# Patient Record
Sex: Female | Born: 1955 | Race: White | Hispanic: No | Marital: Married | Smoking: Never smoker
Health system: Southern US, Community
[De-identification: ages and names within clinical notes are randomized; demographics above are authoritative.]

## PROBLEM LIST (undated history)

## (undated) DIAGNOSIS — I10 Essential (primary) hypertension: Secondary | ICD-10-CM

## (undated) DIAGNOSIS — E785 Hyperlipidemia, unspecified: Secondary | ICD-10-CM

## (undated) DIAGNOSIS — M1990 Unspecified osteoarthritis, unspecified site: Secondary | ICD-10-CM

## (undated) DIAGNOSIS — F419 Anxiety disorder, unspecified: Secondary | ICD-10-CM

## (undated) DIAGNOSIS — G629 Polyneuropathy, unspecified: Secondary | ICD-10-CM

## (undated) DIAGNOSIS — G4730 Sleep apnea, unspecified: Secondary | ICD-10-CM

## (undated) DIAGNOSIS — R0600 Dyspnea, unspecified: Secondary | ICD-10-CM

## (undated) DIAGNOSIS — E119 Type 2 diabetes mellitus without complications: Secondary | ICD-10-CM

## (undated) DIAGNOSIS — F329 Major depressive disorder, single episode, unspecified: Secondary | ICD-10-CM

## (undated) DIAGNOSIS — F32A Depression, unspecified: Secondary | ICD-10-CM

## (undated) HISTORY — PX: ABDOMINAL HYSTERECTOMY: SHX81

## (undated) HISTORY — PX: KNEE ARTHROCENTESIS: SUR44

## (undated) HISTORY — DX: Hyperlipidemia, unspecified: E78.5

## (undated) HISTORY — PX: APPENDECTOMY: SHX54

---

## 1979-12-17 HISTORY — PX: KNEE ARTHROCENTESIS: SUR44

## 1990-12-16 HISTORY — PX: KNEE ARTHROCENTESIS: SUR44

## 1993-12-16 HISTORY — PX: OTHER SURGICAL HISTORY: SHX169

## 1993-12-16 HISTORY — PX: ABDOMINAL HYSTERECTOMY: SHX81

## 2005-05-09 ENCOUNTER — Ambulatory Visit: Payer: Self-pay | Admitting: Family Medicine

## 2009-08-15 ENCOUNTER — Ambulatory Visit: Payer: Self-pay | Admitting: Family Medicine

## 2010-06-01 ENCOUNTER — Ambulatory Visit: Payer: Self-pay | Admitting: Sports Medicine

## 2012-02-17 ENCOUNTER — Ambulatory Visit: Payer: Self-pay

## 2012-08-10 DIAGNOSIS — M1990 Unspecified osteoarthritis, unspecified site: Secondary | ICD-10-CM | POA: Insufficient documentation

## 2012-08-10 DIAGNOSIS — K219 Gastro-esophageal reflux disease without esophagitis: Secondary | ICD-10-CM | POA: Insufficient documentation

## 2015-02-15 DIAGNOSIS — G629 Polyneuropathy, unspecified: Secondary | ICD-10-CM | POA: Insufficient documentation

## 2016-04-08 DIAGNOSIS — G894 Chronic pain syndrome: Secondary | ICD-10-CM | POA: Insufficient documentation

## 2016-04-08 DIAGNOSIS — G8929 Other chronic pain: Secondary | ICD-10-CM | POA: Insufficient documentation

## 2016-04-15 ENCOUNTER — Other Ambulatory Visit: Payer: Self-pay | Admitting: Student

## 2016-04-15 DIAGNOSIS — R140 Abdominal distension (gaseous): Secondary | ICD-10-CM

## 2016-04-15 DIAGNOSIS — R1084 Generalized abdominal pain: Secondary | ICD-10-CM

## 2016-04-18 ENCOUNTER — Ambulatory Visit
Admission: RE | Admit: 2016-04-18 | Discharge: 2016-04-18 | Disposition: A | Discharge status: Home / Self Care | Payer: BLUE CROSS/BLUE SHIELD | Source: Ambulatory Visit | Attending: Student | Admitting: Student

## 2016-04-18 DIAGNOSIS — R1084 Generalized abdominal pain: Secondary | ICD-10-CM

## 2016-04-18 DIAGNOSIS — K760 Fatty (change of) liver, not elsewhere classified: Secondary | ICD-10-CM | POA: Diagnosis not present

## 2016-04-18 DIAGNOSIS — R140 Abdominal distension (gaseous): Secondary | ICD-10-CM

## 2016-05-03 ENCOUNTER — Encounter: Payer: Self-pay | Admitting: *Deleted

## 2016-05-03 ENCOUNTER — Inpatient Hospital Stay: Payer: BLUE CROSS/BLUE SHIELD | Admitting: Internal Medicine

## 2016-05-24 ENCOUNTER — Encounter: Payer: Self-pay | Admitting: *Deleted

## 2016-05-27 ENCOUNTER — Ambulatory Visit: Payer: BLUE CROSS/BLUE SHIELD | Admitting: Anesthesiology

## 2016-05-27 ENCOUNTER — Encounter
Admission: RE | Disposition: A | Discharge status: Home / Self Care | Payer: Self-pay | Source: Ambulatory Visit | Attending: Unknown Physician Specialty

## 2016-05-27 ENCOUNTER — Ambulatory Visit
Admission: RE | Admit: 2016-05-27 | Discharge: 2016-05-27 | Disposition: A | Discharge status: Home / Self Care | Payer: BLUE CROSS/BLUE SHIELD | Source: Ambulatory Visit | Attending: Unknown Physician Specialty | Admitting: Unknown Physician Specialty

## 2016-05-27 ENCOUNTER — Encounter: Payer: Self-pay | Admitting: *Deleted

## 2016-05-27 DIAGNOSIS — E785 Hyperlipidemia, unspecified: Secondary | ICD-10-CM | POA: Insufficient documentation

## 2016-05-27 DIAGNOSIS — Z794 Long term (current) use of insulin: Secondary | ICD-10-CM | POA: Insufficient documentation

## 2016-05-27 DIAGNOSIS — D123 Benign neoplasm of transverse colon: Secondary | ICD-10-CM | POA: Diagnosis not present

## 2016-05-27 DIAGNOSIS — D125 Benign neoplasm of sigmoid colon: Secondary | ICD-10-CM | POA: Insufficient documentation

## 2016-05-27 DIAGNOSIS — F329 Major depressive disorder, single episode, unspecified: Secondary | ICD-10-CM | POA: Diagnosis not present

## 2016-05-27 DIAGNOSIS — D124 Benign neoplasm of descending colon: Secondary | ICD-10-CM | POA: Insufficient documentation

## 2016-05-27 DIAGNOSIS — I10 Essential (primary) hypertension: Secondary | ICD-10-CM | POA: Diagnosis not present

## 2016-05-27 DIAGNOSIS — M19049 Primary osteoarthritis, unspecified hand: Secondary | ICD-10-CM | POA: Insufficient documentation

## 2016-05-27 DIAGNOSIS — K529 Noninfective gastroenteritis and colitis, unspecified: Secondary | ICD-10-CM | POA: Insufficient documentation

## 2016-05-27 DIAGNOSIS — F419 Anxiety disorder, unspecified: Secondary | ICD-10-CM | POA: Insufficient documentation

## 2016-05-27 DIAGNOSIS — Z8371 Family history of colonic polyps: Secondary | ICD-10-CM | POA: Diagnosis not present

## 2016-05-27 DIAGNOSIS — Z7982 Long term (current) use of aspirin: Secondary | ICD-10-CM | POA: Insufficient documentation

## 2016-05-27 DIAGNOSIS — E119 Type 2 diabetes mellitus without complications: Secondary | ICD-10-CM | POA: Insufficient documentation

## 2016-05-27 DIAGNOSIS — Z90710 Acquired absence of both cervix and uterus: Secondary | ICD-10-CM | POA: Diagnosis not present

## 2016-05-27 DIAGNOSIS — D120 Benign neoplasm of cecum: Secondary | ICD-10-CM | POA: Insufficient documentation

## 2016-05-27 DIAGNOSIS — D122 Benign neoplasm of ascending colon: Secondary | ICD-10-CM | POA: Insufficient documentation

## 2016-05-27 DIAGNOSIS — Z79899 Other long term (current) drug therapy: Secondary | ICD-10-CM | POA: Diagnosis not present

## 2016-05-27 DIAGNOSIS — K640 First degree hemorrhoids: Secondary | ICD-10-CM | POA: Diagnosis not present

## 2016-05-27 DIAGNOSIS — K5730 Diverticulosis of large intestine without perforation or abscess without bleeding: Secondary | ICD-10-CM | POA: Diagnosis not present

## 2016-05-27 HISTORY — PX: COLONOSCOPY WITH PROPOFOL: SHX5780

## 2016-05-27 HISTORY — DX: Anxiety disorder, unspecified: F41.9

## 2016-05-27 HISTORY — DX: Depression, unspecified: F32.A

## 2016-05-27 HISTORY — DX: Essential (primary) hypertension: I10

## 2016-05-27 HISTORY — DX: Type 2 diabetes mellitus without complications: E11.9

## 2016-05-27 HISTORY — DX: Major depressive disorder, single episode, unspecified: F32.9

## 2016-05-27 HISTORY — DX: Unspecified osteoarthritis, unspecified site: M19.90

## 2016-05-27 LAB — GLUCOSE, CAPILLARY: GLUCOSE-CAPILLARY: 209 mg/dL — AB (ref 65–99)

## 2016-05-27 SURGERY — COLONOSCOPY WITH PROPOFOL
Anesthesia: General

## 2016-05-27 MED ORDER — PROPOFOL 10 MG/ML IV BOLUS
INTRAVENOUS | Status: DC | PRN
  Administered 2016-05-27: 30 mg via INTRAVENOUS
  Administered 2016-05-27: 70 mg via INTRAVENOUS

## 2016-05-27 MED ORDER — LIDOCAINE HCL (CARDIAC) 20 MG/ML IV SOLN
INTRAVENOUS | Status: DC | PRN
  Administered 2016-05-27: 40 mg via INTRAVENOUS

## 2016-05-27 MED ORDER — SODIUM CHLORIDE 0.9 % IV SOLN
INTRAVENOUS | Status: DC
  Administered 2016-05-27: 1000 mL via INTRAVENOUS

## 2016-05-27 MED ORDER — SODIUM CHLORIDE 0.9 % IV SOLN
INTRAVENOUS | Status: DC
Start: 2016-05-27 — End: 2016-05-27

## 2016-05-27 MED ORDER — PROPOFOL 500 MG/50ML IV EMUL
INTRAVENOUS | Status: DC | PRN
  Administered 2016-05-27: 150 ug/kg/min via INTRAVENOUS

## 2016-05-27 NOTE — Anesthesia Preprocedure Evaluation (Signed)
Anesthesia Evaluation  Patient identified by MRN, date of birth, ID band Patient awake    Reviewed: Allergy & Precautions, H&P , NPO status , Patient's Chart, lab work & pertinent test results, reviewed documented beta blocker date and time   History of Anesthesia Complications Negative for: history of anesthetic complications  Airway Mallampati: IV  TM Distance: >3 FB Neck ROM: full    Dental no notable dental hx. (+) Chipped, Poor Dentition, Missing   Pulmonary neg pulmonary ROS,    Pulmonary exam normal breath sounds clear to auscultation       Cardiovascular Exercise Tolerance: Good hypertension, On Medications (-) angina(-) CAD, (-) Past MI, (-) Cardiac Stents and (-) CABG Normal cardiovascular exam(-) dysrhythmias (-) Valvular Problems/Murmurs Rhythm:regular Rate:Normal     Neuro/Psych PSYCHIATRIC DISORDERS (Anxiety and depression) negative neurological ROS     GI/Hepatic Neg liver ROS, GERD  ,  Endo/Other  diabetes, Insulin Dependent  Renal/GU negative Renal ROS  negative genitourinary   Musculoskeletal   Abdominal   Peds  Hematology negative hematology ROS (+)   Anesthesia Other Findings Past Medical History:   Depression                                                   Diabetes mellitus without complication (HCC)                 Hyperlipemia                                                 Hypertension                                                 Anxiety                                                      Arthritis                                                      Comment:hand   Reproductive/Obstetrics negative OB ROS                             Anesthesia Physical Anesthesia Plan  ASA: III  Anesthesia Plan: General   Post-op Pain Management:    Induction:   Airway Management Planned:   Additional Equipment:   Intra-op Plan:   Post-operative Plan:    Informed Consent: I have reviewed the patients History and Physical, chart, labs and discussed the procedure including the risks, benefits and alternatives for the proposed anesthesia with the patient or authorized representative who has indicated his/her understanding and acceptance.   Dental Advisory Given  Plan Discussed with: Anesthesiologist, CRNA and Surgeon  Anesthesia Plan  Comments:         Anesthesia Quick Evaluation

## 2016-05-27 NOTE — Transfer of Care (Signed)
Immediate Anesthesia Transfer of Care Note  Patient: Emma White  Procedure(s) Performed: Procedure(s): COLONOSCOPY WITH PROPOFOL (N/A)  Patient Location: Endoscopy Unit  Anesthesia Type:General  Level of Consciousness: awake and alert   Airway & Oxygen Therapy: Patient Spontanous Breathing and Patient connected to nasal cannula oxygen  Post-op Assessment: Report given to RN and Post -op Vital signs reviewed and stable  Post vital signs: Reviewed and stable  Last Vitals:  Filed Vitals:   05/27/16 0707  BP: 142/59  Pulse: 93  Temp: 36.9 C  Resp: 18    Last Pain:  Filed Vitals:   05/27/16 0708  PainSc: 9          Complications: No apparent anesthesia complications

## 2016-05-27 NOTE — H&P (Signed)
Primary Care Physician:  Lavonne Chick, MD Primary Gastroenterologist:  Dr. Vira Agar  Pre-Procedure History & Physical: HPI:  Emma White is a 60 y.o. female is here for an colonoscopy.   Past Medical History  Diagnosis Date  . Depression   . Diabetes mellitus without complication ([place])   . Hyperlipemia   . Hypertension   . Anxiety   . Arthritis     hand    Past Surgical History  Procedure Laterality Date  . Knee arthrocentesis Left 1981  . Knee arthrocentesis Right 1992  . Hysterectomy supracervical abdominal w/removal tubes &/or ovaries  1995  . Abdominal hysterectomy    . Knee arthrocentesis Bilateral     Prior to Admission medications   Medication Sig Start Date End Date Taking? Authorizing Provider  aspirin 81 MG tablet Take 81 mg by mouth daily.   Yes Historical Provider, MD  citalopram (CELEXA) 20 MG tablet Take 20 mg by mouth daily.   Yes Historical Provider, MD  gabapentin (NEURONTIN) 800 MG tablet Take 800 mg by mouth 3 (three) times daily.   Yes Historical Provider, MD  HYDROcodone-acetaminophen (NORCO/VICODIN) 5-325 MG tablet Take 1 tablet by mouth every 6 (six) hours as needed for moderate pain.   Yes Historical Provider, MD  insulin aspart (NOVOLOG) 100 UNIT/ML injection Inject into the skin 3 (three) times daily before meals.   Yes Historical Provider, MD  insulin detemir (LEVEMIR) 100 UNIT/ML injection Inject into the skin at bedtime.   Yes Historical Provider, MD  lisinopril (PRINIVIL,ZESTRIL) 2.5 MG tablet Take 2.5 mg by mouth daily.   Yes Historical Provider, MD  metFORMIN (GLUCOPHAGE) 500 MG tablet Take 1,000 mg by mouth 2 (two) times daily with a meal.   Yes Historical Provider, MD  MULTIPLE VITAMIN-FOLIC ACID PO Take 1 tablet by mouth once.   Yes Historical Provider, MD  Potassium 99 MG TABS Take 1 tablet by mouth daily.   Yes Historical Provider, MD  pravastatin (PRAVACHOL) 10 MG tablet Take 10 mg by mouth daily.   Yes Historical Provider, MD   pregabalin (LYRICA) 100 MG capsule Take 100 mg by mouth 2 (two) times daily.   Yes Historical Provider, MD    Allergies as of 05/10/2016  . (Not on File)    History reviewed. No pertinent family history.  Social History   Social History  . Marital Status: Married    Spouse Name: N/A  . Number of Children: N/A  . Years of Education: N/A   Occupational History  . Not on file.   Social History Main Topics  . Smoking status: Never Smoker   . Smokeless tobacco: Not on file  . Alcohol Use: No  . Drug Use: No  . Sexual Activity: Not on file   Other Topics Concern  . Not on file   Social History Narrative   ** Merged History Encounter **        Review of Systems: See HPI, otherwise negative ROS  Physical Exam: BP 142/59 mmHg  Pulse 93  Temp(Src) 98.4 F (36.9 C) (Tympanic)  Resp 18  Ht 5' (1.524 m)  Wt 69.4 kg (153 lb)  BMI 29.88 kg/m2  SpO2 98% General:   Alert,  pleasant and cooperative in NAD Head:  Normocephalic and atraumatic. Neck:  Supple; no masses or thyromegaly. Lungs:  Clear throughout to auscultation.    Heart:  Regular rate and rhythm. Abdomen:  Soft, nontender and nondistended. Normal bowel sounds, without guarding, and without rebound.  Neurologic:  Alert and  oriented x4;  grossly normal neurologically.  Impression/Plan: Emma White is here for an colonoscopy to be performed for FH colon polyps, chronic diarrhea.  Risks, benefits, limitations, and alternatives regarding  colonoscopy have been reviewed with the patient.  Questions have been answered.  All parties agreeable.   Gaylyn Cheers, MD  05/27/2016, 7:34 AM

## 2016-05-27 NOTE — Op Note (Signed)
[facility] Gastroenterology Patient Name: Emma White Procedure Date: 05/27/2016 7:35 AM MRN: [id number] Account #: [id number] Date of Birth: May 24, 1956 Admit Type: Outpatient Age: 60 Room: [facility] ENDO ROOM 1 Gender: Female Note Status: Finalized Procedure:            Colonoscopy Indications:          Screening for colorectal malignant neoplasm Providers:            Manya Silvas, MD Referring MD:         No Local Md, MD (Referring MD) Medicines:            Propofol per Anesthesia Complications:        No immediate complications. Procedure:            Pre-Anesthesia Assessment:                       - After reviewing the risks and benefits, the patient                        was deemed in satisfactory condition to undergo the                        procedure.                       After obtaining informed consent, the colonoscope was                        passed under direct vision. Throughout the procedure,                        the patient's blood pressure, pulse, and oxygen                        saturations were monitored continuously. The                        Colonoscope was introduced through the anus and                        advanced to the the cecum, identified by appendiceal                        orifice and ileocecal valve. The colonoscopy was                        performed without difficulty. The patient tolerated the                        procedure well. The quality of the bowel preparation                        was adequate to identify polyps. Findings:      Two sessile polyps were found in the sigmoid colon and descending colon.       The polyps were diminutive in size. These polyps were removed with a       jumbo cold forceps. Resection and retrieval were complete.      A small polyp was found in the transverse colon. The polyp was sessile.       The polyp was removed with  a hot snare. Resection and retrieval were       complete.     A diminutive polyp was found in the transverse colon. The polyp was       sessile. The polyp was removed with a jumbo cold forceps. Resection and       retrieval were complete.      A medium polyp was found in the ascending colon. The polyp was sessile.       The polyp was removed with a hot snare. Resection and retrieval were       complete. To prevent bleeding after the polypectomy, one hemostatic clip       was successfully placed. There was no bleeding during, or at the end, of       the procedure.      A small polyp was found in the ascending colon. The polyp was sessile.       The polyp was removed with a hot snare. Resection and retrieval were       complete. To prevent bleeding after the polypectomy, one hemostatic clip       was successfully placed. There was no bleeding during, or at the end, of       the procedure.      A medium polyp was found in the ascending colon. The polyp was sessile.       The polyp was removed with a hot snare. Resection and retrieval were       complete. To prevent bleeding after the polypectomy, one hemostatic clip       was successfully placed. There was no bleeding during, or at the end, of       the procedure.      A small polyp was found in the ascending colon. The polyp was sessile.       The polyp was removed with a hot snare. Resection and retrieval were       complete. To prevent bleeding after the polypectomy, one hemostatic clip       was successfully placed. There was no bleeding during, or at the end, of       the procedure.      A medium polyp was found in the cecum. The polyp was sessile. The polyp       was removed with a hot snare. Resection and retrieval were complete. To       prevent bleeding after the polypectomy, one hemostatic clip was       successfully placed.      A diminutive polyp was found in the cecum. The polyp was sessile. The       polyp was removed with a jumbo cold forceps. Resection and retrieval       were  complete.      Two sessile polyps were found in the cecum. The polyps were diminutive       in size. These polyps were removed with a jumbo cold forceps. Resection       and retrieval were complete.      A small polyp was found in the distal ascending colon. The polyp was       sessile. The polyp was removed with a hot snare. Resection and retrieval       were complete.      A few medium-mouthed diverticula were found in the sigmoid colon.      Internal hemorrhoids were found during endoscopy. The hemorrhoids were  small and Grade I (internal hemorrhoids that do not prolapse). Impression:           - Two diminutive polyps in the sigmoid colon and in the                        descending colon, removed with a jumbo cold forceps.                        Resected and retrieved.                       - One small polyp in the transverse colon, removed with                        a hot snare. Resected and retrieved.                       - One diminutive polyp in the transverse colon, removed                        with a jumbo cold forceps. Resected and retrieved.                       - One medium polyp in the ascending colon, removed with                        a hot snare. Resected and retrieved. Clip was placed.                       - One small polyp in the ascending colon, removed with                        a hot snare. Resected and retrieved. Clip was placed.                       - One medium polyp in the ascending colon, removed with                        a hot snare. Resected and retrieved. Clip was placed.                       - One small polyp in the ascending colon, removed with                        a hot snare. Resected and retrieved. Clip was placed.                       - One medium polyp in the cecum, removed with a hot                        snare. Resected and retrieved. Clip was placed.                       - One diminutive polyp in the cecum, removed with a                         jumbo cold forceps. Resected and retrieved.                       -  Two diminutive polyps in the cecum, removed with a                        jumbo cold forceps. Resected and retrieved.                       - One small polyp in the distal ascending colon,                        removed with a hot snare. Resected and retrieved.                       - Diverticulosis in the sigmoid colon.                       - Internal hemorrhoids. Recommendation:       - Await pathology results. Manya Silvas, MD 05/27/2016 8:49:56 AM This report has been signed electronically. Number of Addenda: 0 Note Initiated On: 05/27/2016 7:35 AM Scope Withdrawal Time: 0 hours 43 minutes 42 seconds  Total Procedure Duration: 0 hours 59 minutes 18 seconds       [facility]

## 2016-05-27 NOTE — Anesthesia Postprocedure Evaluation (Signed)
Anesthesia Post Note  Patient: Emma White  Procedure(s) Performed: Procedure(s) (LRB): COLONOSCOPY WITH PROPOFOL (N/A)  Patient location during evaluation: Endoscopy Anesthesia Type: General Level of consciousness: awake and alert Pain management: pain level controlled Vital Signs Assessment: post-procedure vital signs reviewed and stable Respiratory status: spontaneous breathing, nonlabored ventilation, respiratory function stable and patient connected to nasal cannula oxygen Cardiovascular status: blood pressure returned to baseline and stable Postop Assessment: no signs of nausea or vomiting Anesthetic complications: no    Last Vitals:  Filed Vitals:   05/27/16 0902 05/27/16 0912  BP: 135/76 131/67  Pulse: 75 74  Temp:    Resp: 22 18    Last Pain:  Filed Vitals:   05/27/16 0915  PainSc: 9                  Martha Clan

## 2016-05-29 ENCOUNTER — Encounter: Payer: Self-pay | Admitting: Unknown Physician Specialty

## 2016-05-29 LAB — SURGICAL PATHOLOGY

## 2017-01-06 DIAGNOSIS — E119 Type 2 diabetes mellitus without complications: Secondary | ICD-10-CM

## 2017-01-06 DIAGNOSIS — F330 Major depressive disorder, recurrent, mild: Secondary | ICD-10-CM | POA: Insufficient documentation

## 2017-09-16 ENCOUNTER — Encounter: Payer: Self-pay | Admitting: *Deleted

## 2017-09-25 ENCOUNTER — Ambulatory Visit: Payer: BLUE CROSS/BLUE SHIELD | Admitting: Anesthesiology

## 2017-09-25 ENCOUNTER — Encounter
Admission: RE | Disposition: A | Discharge status: Home / Self Care | Payer: Self-pay | Source: Ambulatory Visit | Attending: Ophthalmology

## 2017-09-25 ENCOUNTER — Encounter: Payer: Self-pay | Admitting: *Deleted

## 2017-09-25 ENCOUNTER — Ambulatory Visit
Admission: RE | Admit: 2017-09-25 | Discharge: 2017-09-25 | Disposition: A | Discharge status: Home / Self Care | Payer: BLUE CROSS/BLUE SHIELD | Source: Ambulatory Visit | Attending: Ophthalmology | Admitting: Ophthalmology

## 2017-09-25 DIAGNOSIS — R0602 Shortness of breath: Secondary | ICD-10-CM | POA: Insufficient documentation

## 2017-09-25 DIAGNOSIS — I10 Essential (primary) hypertension: Secondary | ICD-10-CM | POA: Insufficient documentation

## 2017-09-25 DIAGNOSIS — Z90710 Acquired absence of both cervix and uterus: Secondary | ICD-10-CM | POA: Insufficient documentation

## 2017-09-25 DIAGNOSIS — E1121 Type 2 diabetes mellitus with diabetic nephropathy: Secondary | ICD-10-CM | POA: Insufficient documentation

## 2017-09-25 DIAGNOSIS — E1136 Type 2 diabetes mellitus with diabetic cataract: Secondary | ICD-10-CM | POA: Diagnosis not present

## 2017-09-25 DIAGNOSIS — M1990 Unspecified osteoarthritis, unspecified site: Secondary | ICD-10-CM | POA: Diagnosis not present

## 2017-09-25 DIAGNOSIS — H2512 Age-related nuclear cataract, left eye: Secondary | ICD-10-CM | POA: Diagnosis present

## 2017-09-25 DIAGNOSIS — E7800 Pure hypercholesterolemia, unspecified: Secondary | ICD-10-CM | POA: Diagnosis not present

## 2017-09-25 HISTORY — DX: Dyspnea, unspecified: R06.00

## 2017-09-25 HISTORY — PX: EYE SURGERY: SHX253

## 2017-09-25 HISTORY — PX: CATARACT EXTRACTION W/PHACO: SHX586

## 2017-09-25 HISTORY — DX: Polyneuropathy, unspecified: G62.9

## 2017-09-25 LAB — GLUCOSE, CAPILLARY: GLUCOSE-CAPILLARY: 178 mg/dL — AB (ref 65–99)

## 2017-09-25 SURGERY — PHACOEMULSIFICATION, CATARACT, WITH IOL INSERTION
Anesthesia: Monitor Anesthesia Care | Laterality: Left

## 2017-09-25 MED ORDER — SODIUM CHLORIDE 0.9 % IV SOLN
INTRAVENOUS | Status: DC
  Administered 2017-09-25: 10:00:00 via INTRAVENOUS

## 2017-09-25 MED ORDER — MOXIFLOXACIN HCL 0.5 % OP SOLN
OPHTHALMIC | Status: DC | PRN
  Administered 2017-09-25: 2 [drp] via OPHTHALMIC

## 2017-09-25 MED ORDER — POVIDONE-IODINE 5 % OP SOLN
OPHTHALMIC | Status: AC
  Filled 2017-09-25: qty 30

## 2017-09-25 MED ORDER — MOXIFLOXACIN HCL 0.5 % OP SOLN: 1.0000 [drp] | OPHTHALMIC | Status: DC | PRN

## 2017-09-25 MED ORDER — FENTANYL CITRATE (PF) 100 MCG/2ML IJ SOLN
INTRAMUSCULAR | Status: AC
  Filled 2017-09-25: qty 2

## 2017-09-25 MED ORDER — LIDOCAINE HCL (PF) 4 % IJ SOLN
INTRAOCULAR | Status: DC | PRN
  Administered 2017-09-25: .5 mL via OPHTHALMIC

## 2017-09-25 MED ORDER — LIDOCAINE HCL (PF) 4 % IJ SOLN
INTRAMUSCULAR | Status: AC
  Filled 2017-09-25: qty 5

## 2017-09-25 MED ORDER — EPINEPHRINE PF 1 MG/ML IJ SOLN
INTRAMUSCULAR | Status: AC
  Filled 2017-09-25: qty 1

## 2017-09-25 MED ORDER — MIDAZOLAM HCL 2 MG/2ML IJ SOLN
INTRAMUSCULAR | Status: DC | PRN
  Administered 2017-09-25 (×2): 1 mg via INTRAVENOUS

## 2017-09-25 MED ORDER — MIDAZOLAM HCL 2 MG/2ML IJ SOLN
INTRAMUSCULAR | Status: AC
  Filled 2017-09-25: qty 2

## 2017-09-25 MED ORDER — SODIUM HYALURONATE 23 MG/ML IO SOLN
INTRAOCULAR | Status: DC | PRN
  Administered 2017-09-25: 1 mL via INTRAOCULAR

## 2017-09-25 MED ORDER — ARMC OPHTHALMIC DILATING DROPS
1.0000 "application " | OPHTHALMIC | Status: AC
  Administered 2017-09-25 (×3): 1 via OPHTHALMIC

## 2017-09-25 MED ORDER — NA CHONDROIT SULF-NA HYALURON 40-17 MG/ML IO SOLN
INTRAOCULAR | Status: AC
  Filled 2017-09-25: qty 1

## 2017-09-25 MED ORDER — BSS IO SOLN
INTRAOCULAR | Status: DC | PRN
  Administered 2017-09-25: 1 via INTRAOCULAR

## 2017-09-25 MED ORDER — FENTANYL CITRATE (PF) 100 MCG/2ML IJ SOLN
INTRAMUSCULAR | Status: DC | PRN
  Administered 2017-09-25 (×2): 50 ug via INTRAVENOUS

## 2017-09-25 SURGICAL SUPPLY — 18 items
DISSECTOR HYDRO NUCLEUS 50X22 (MISCELLANEOUS) ×2 IMPLANT
GLOVE BIO SURGEON STRL SZ8 (GLOVE) ×2 IMPLANT
GLOVE BIOGEL M 6.5 STRL (GLOVE) ×2 IMPLANT
GLOVE SURG LX 7.5 STRW (GLOVE) ×1
GLOVE SURG LX STRL 7.5 STRW (GLOVE) ×1 IMPLANT
GOWN STRL REUS W/ TWL LRG LVL3 (GOWN DISPOSABLE) ×2 IMPLANT
GOWN STRL REUS W/TWL LRG LVL3 (GOWN DISPOSABLE) ×2
LABEL CATARACT MEDS ST (LABEL) ×2 IMPLANT
LENS IOL TECNIS ITEC 21.5 (Intraocular Lens) ×2 IMPLANT
PACK CATARACT (MISCELLANEOUS) ×2 IMPLANT
PACK CATARACT KING (MISCELLANEOUS) ×2 IMPLANT
PACK EYE AFTER SURG (MISCELLANEOUS) ×2 IMPLANT
SOL BAL SALT 15ML (MISCELLANEOUS) ×2
SOL BSS BAG (MISCELLANEOUS) ×2
SOLUTION BAL SALT 15ML (MISCELLANEOUS) ×1 IMPLANT
SOLUTION BSS BAG (MISCELLANEOUS) ×1 IMPLANT
WATER STERILE IRR 250ML POUR (IV SOLUTION) ×2 IMPLANT
WIPE NON LINTING 3.25X3.25 (MISCELLANEOUS) ×2 IMPLANT

## 2017-09-25 NOTE — Transfer of Care (Signed)
Immediate Anesthesia Transfer of Care Note  Patient: Emma White  Procedure(s) Performed: CATARACT EXTRACTION PHACO AND INTRAOCULAR LENS PLACEMENT (IOC) (Left )  Patient Location: PACU and Short Stay  Anesthesia Type:MAC  Level of Consciousness: awake, alert  and oriented  Airway & Oxygen Therapy: Patient Spontanous Breathing  Post-op Assessment: Report given to RN and Post -op Vital signs reviewed and stable  Post vital signs: Reviewed and stable  Last Vitals:  Vitals:   09/25/17 0929 09/25/17 1122  BP: (!) 153/63 (!) 109/46  Pulse: 78 73  Resp: 20 18  Temp: (!) 36 C   SpO2: 96% 95%    Last Pain:  Vitals:   09/25/17 0929  TempSrc: Tympanic         Complications: No apparent anesthesia complications

## 2017-09-25 NOTE — Anesthesia Post-op Follow-up Note (Signed)
Anesthesia QCDR form completed.        

## 2017-09-25 NOTE — Anesthesia Preprocedure Evaluation (Signed)
Anesthesia Evaluation  Patient identified by MRN, date of birth, ID band Patient awake    Reviewed: Allergy & Precautions, NPO status , Patient's Chart, lab work & pertinent test results  History of Anesthesia Complications Negative for: history of anesthetic complications  Airway Mallampati: III       Dental  (+) Chipped, Missing   Pulmonary neg sleep apnea, neg COPD,           Cardiovascular hypertension, Pt. on medications (-) Past MI and (-) CHF (-) dysrhythmias (-) Valvular Problems/Murmurs     Neuro/Psych neg Seizures Anxiety Depression    GI/Hepatic Neg liver ROS, neg GERD  ,  Endo/Other  diabetes, Type 2, Insulin Dependent, Oral Hypoglycemic Agents  Renal/GU negative Renal ROS     Musculoskeletal   Abdominal   Peds  Hematology   Anesthesia Other Findings   Reproductive/Obstetrics                             Anesthesia Physical Anesthesia Plan  ASA: III  Anesthesia Plan: MAC   Post-op Pain Management:    Induction: Intravenous  PONV Risk Score and Plan:   Airway Management Planned: Nasal Cannula  Additional Equipment:   Intra-op Plan:   Post-operative Plan:   Informed Consent: I have reviewed the patients History and Physical, chart, labs and discussed the procedure including the risks, benefits and alternatives for the proposed anesthesia with the patient or authorized representative who has indicated his/her understanding and acceptance.     Plan Discussed with:   Anesthesia Plan Comments:         Anesthesia Quick Evaluation

## 2017-09-25 NOTE — Op Note (Signed)
OPERATIVE NOTE  Emma White [id number] 09/25/2017   PREOPERATIVE DIAGNOSIS:  Nuclear sclerotic cataract left eye.  H25.12   POSTOPERATIVE DIAGNOSIS:    Nuclear sclerotic cataract left eye.     PROCEDURE:  Phacoemusification with posterior chamber intraocular lens placement of the left eye   LENS:   Implant Name Type Inv. Item Serial No. Manufacturer Lot No. LRB No. Used  LENS IOL DIOP 21.5 - [id number] Intraocular Lens LENS IOL DIOP 21.5 [id number] AMO   Left 1       PCB00 +21.5   ULTRASOUND TIME: 0 minutes 32.7 seconds.  CDE 3.95   SURGEON:  Benay Pillow, MD, MPH   ANESTHESIA:  Topical with tetracaine drops augmented with 1% preservative-free intracameral lidocaine.  ESTIMATED BLOOD LOSS: <1 mL   COMPLICATIONS:  None.   DESCRIPTION OF PROCEDURE:  The patient was identified in the holding room and transported to the operating room and placed in the supine position under the operating microscope.  The left eye was identified as the operative eye and it was prepped and draped in the usual sterile ophthalmic fashion.   A 1.0 millimeter clear-corneal paracentesis was made at the 5:00 position. 0.5 ml of preservative-free 1% lidocaine with epinephrine was injected into the anterior chamber.  The anterior chamber was filled with Discovisc viscoelastic.  A 2.4 millimeter keratome was used to make a near-clear corneal incision at the 2:00 position.  A curvilinear capsulorrhexis was made with a cystotome and capsulorrhexis forceps.  Balanced salt solution was used to hydrodissect and hydrodelineate the nucleus.   Phacoemulsification was then used in stop and chop fashion to remove the lens nucleus and epinucleus.  The remaining cortex was then removed using the irrigation and aspiration handpiece. Discovisc was then placed into the capsular bag to distend it for lens placement.  A lens was then injected into the capsular bag.  The remaining viscoelastic was aspirated.   Wounds were  hydrated with balanced salt solution.  The anterior chamber was inflated to a physiologic pressure with balanced salt solution.  Intracameral vigamox 0.1 mL undiltued was injected into the eye and a drop placed onto the ocular surface.  No wound leaks were noted.  The patient was taken to the recovery room in stable condition without complications of anesthesia or surgery  Benay Pillow 09/25/2017, 11:20 AM

## 2017-09-25 NOTE — H&P (Signed)
The History and Physical notes are on paper, have been signed, and are to be scanned.   I have examined the patient and there are no changes to the H&P.   Emma White 09/25/2017 10:48 AM

## 2017-09-25 NOTE — Discharge Instructions (Signed)
Eye Surgery Discharge Instructions  Expect mild scratchy sensation or mild soreness. DO NOT RUB YOUR EYE!  The day of surgery:  Minimal physical activity, but bed rest is not required  No reading, computer work, or close hand work  No bending, lifting, or straining.  May watch TV  For 24 hours:  No driving, legal decisions, or alcoholic beverages  Safety precautions  Eat anything you prefer: It is better to start with liquids, then soup then solid foods.  _____ Eye patch should be worn until postoperative exam tomorrow.  ____ Solar shield eyeglasses should be worn for comfort in the sunlight/patch while sleeping  Resume all regular medications including aspirin or Coumadin if these were discontinued prior to surgery. You may shower, bathe, shave, or wash your hair. Tylenol may be taken for mild discomfort.  Call your doctor if you experience significant pain, nausea, or vomiting, fever > 101 or other signs of infection. [phone] or [phone] Specific instructions:  Follow-up Information    Eulogio Bear, MD Follow up.   Specialty:  Ophthalmology Why:  09/26/17 at 8:45 Contact information: [address] [place] [address] [phone]          Eye Surgery Discharge Instructions  Expect mild scratchy sensation or mild soreness. DO NOT RUB YOUR EYE!  The day of surgery:  Minimal physical activity, but bed rest is not required  No reading, computer work, or close hand work  No bending, lifting, or straining.  May watch TV  For 24 hours:  No driving, legal decisions, or alcoholic beverages  Safety precautions  Eat anything you prefer: It is better to start with liquids, then soup then solid foods.  _____ Eye patch should be worn until postoperative exam tomorrow.  ____ Solar shield eyeglasses should be worn for comfort in the sunlight/patch while sleeping  Resume all regular medications including aspirin or Coumadin if these were  discontinued prior to surgery. You may shower, bathe, shave, or wash your hair. Tylenol may be taken for mild discomfort.  Call your doctor if you experience significant pain, nausea, or vomiting, fever > 101 or other signs of infection. [phone] or [phone] Specific instructions:  Follow-up Information    Eulogio Bear, MD Follow up.   Specialty:  Ophthalmology Why:  09/26/17 at 8:45 Contact information: [address] [place] [address] [phone]         Eye Surgery Discharge Instructions  Expect mild scratchy sensation or mild soreness. DO NOT RUB YOUR EYE!  The day of surgery:  Minimal physical activity, but bed rest is not required  No reading, computer work, or close hand work  No bending, lifting, or straining.  May watch TV  For 24 hours:  No driving, legal decisions, or alcoholic beverages  Safety precautions  Eat anything you prefer: It is better to start with liquids, then soup then solid foods.  _____ Eye patch should be worn until postoperative exam tomorrow.  ____ Solar shield eyeglasses should be worn for comfort in the sunlight/patch while sleeping  Resume all regular medications including aspirin or Coumadin if these were discontinued prior to surgery. You may shower, bathe, shave, or wash your hair. Tylenol may be taken for mild discomfort.  Call your doctor if you experience significant pain, nausea, or vomiting, fever > 101 or other signs of infection. [phone] or [phone] Specific instructions:   Eye Surgery Discharge Instructions  Expect mild scratchy sensation or mild soreness. DO NOT RUB YOUR EYE!  The day  of surgery:  Minimal physical activity, but bed rest is not required  No reading, computer work, or close hand work  No bending, lifting, or straining.  May watch TV  For 24 hours:  No driving, legal decisions, or alcoholic beverages  Safety precautions  Eat anything you prefer: It is better  to start with liquids, then soup then solid foods.  _____ Eye patch should be worn until postoperative exam tomorrow.  ____ Solar shield eyeglasses should be worn for comfort in the sunlight/patch while sleeping  Resume all regular medications including aspirin or Coumadin if these were discontinued prior to surgery. You may shower, bathe, shave, or wash your hair. Tylenol may be taken for mild discomfort.  Call your doctor if you experience significant pain, nausea, or vomiting, fever > 101 or other signs of infection. [phone] or [phone] Specific instructions:

## 2017-09-25 NOTE — Anesthesia Postprocedure Evaluation (Signed)
Anesthesia Post Note  Patient: Emma White  Procedure(s) Performed: CATARACT EXTRACTION PHACO AND INTRAOCULAR LENS PLACEMENT (IOC) (Left )  Patient location during evaluation: Short Stay Anesthesia Type: MAC Level of consciousness: awake and alert and oriented Pain management: pain level controlled Vital Signs Assessment: post-procedure vital signs reviewed and stable Respiratory status: spontaneous breathing Cardiovascular status: stable Postop Assessment: no headache Anesthetic complications: no     Last Vitals:  Vitals:   09/25/17 1122 09/25/17 1138  BP: (!) 109/46 (!) 99/52  Pulse: 73 71  Resp: 16   Temp: (!) 36.2 C   SpO2: 94% 94%    Last Pain:  Vitals:   09/25/17 1122  TempSrc: Temporal                 Lanora Manis

## 2017-10-09 ENCOUNTER — Encounter: Payer: Self-pay | Admitting: *Deleted

## 2017-10-16 ENCOUNTER — Encounter
Admission: RE | Disposition: A | Discharge status: Home / Self Care | Payer: Self-pay | Source: Ambulatory Visit | Attending: Ophthalmology

## 2017-10-16 ENCOUNTER — Ambulatory Visit: Payer: BLUE CROSS/BLUE SHIELD | Admitting: Anesthesiology

## 2017-10-16 ENCOUNTER — Encounter: Payer: Self-pay | Admitting: *Deleted

## 2017-10-16 ENCOUNTER — Ambulatory Visit
Admission: RE | Admit: 2017-10-16 | Discharge: 2017-10-16 | Disposition: A | Discharge status: Home / Self Care | Payer: BLUE CROSS/BLUE SHIELD | Source: Ambulatory Visit | Attending: Ophthalmology | Admitting: Ophthalmology

## 2017-10-16 DIAGNOSIS — E119 Type 2 diabetes mellitus without complications: Secondary | ICD-10-CM | POA: Diagnosis not present

## 2017-10-16 DIAGNOSIS — E113311 Type 2 diabetes mellitus with moderate nonproliferative diabetic retinopathy with macular edema, right eye: Secondary | ICD-10-CM | POA: Insufficient documentation

## 2017-10-16 DIAGNOSIS — F418 Other specified anxiety disorders: Secondary | ICD-10-CM | POA: Insufficient documentation

## 2017-10-16 DIAGNOSIS — I10 Essential (primary) hypertension: Secondary | ICD-10-CM | POA: Diagnosis not present

## 2017-10-16 DIAGNOSIS — H2511 Age-related nuclear cataract, right eye: Secondary | ICD-10-CM | POA: Diagnosis not present

## 2017-10-16 DIAGNOSIS — M1990 Unspecified osteoarthritis, unspecified site: Secondary | ICD-10-CM | POA: Insufficient documentation

## 2017-10-16 HISTORY — PX: CATARACT EXTRACTION W/PHACO: SHX586

## 2017-10-16 HISTORY — PX: KENALOG INJECTION: SHX5298

## 2017-10-16 LAB — GLUCOSE, CAPILLARY: GLUCOSE-CAPILLARY: 109 mg/dL — AB (ref 65–99)

## 2017-10-16 SURGERY — PHACOEMULSIFICATION, CATARACT, WITH IOL INSERTION
Anesthesia: Monitor Anesthesia Care | Site: Eye | Laterality: Right | Wound class: Clean

## 2017-10-16 MED ORDER — NA HYALUR & NA CHOND-NA HYALUR 0.55-0.5 ML IO KIT
PACK | INTRAOCULAR | Status: AC
  Filled 2017-10-16: qty 1.05

## 2017-10-16 MED ORDER — ARMC OPHTHALMIC DILATING DROPS
OPHTHALMIC | Status: AC
  Filled 2017-10-16: qty 0.4

## 2017-10-16 MED ORDER — CARBACHOL 0.01 % IO SOLN
INTRAOCULAR | Status: DC | PRN
  Administered 2017-10-16: 0.5 mL via INTRAOCULAR

## 2017-10-16 MED ORDER — POVIDONE-IODINE 5 % OP SOLN
OPHTHALMIC | Status: AC
  Filled 2017-10-16: qty 30

## 2017-10-16 MED ORDER — SODIUM HYALURONATE 10 MG/ML IO SOLN
INTRAOCULAR | Status: DC | PRN
  Administered 2017-10-16: 0.55 mL via INTRAOCULAR

## 2017-10-16 MED ORDER — EPINEPHRINE PF 1 MG/ML IJ SOLN
INTRAMUSCULAR | Status: DC | PRN
  Administered 2017-10-16: 200 mL via OPHTHALMIC

## 2017-10-16 MED ORDER — FENTANYL CITRATE (PF) 100 MCG/2ML IJ SOLN
INTRAMUSCULAR | Status: DC | PRN
  Administered 2017-10-16: 25 ug via INTRAVENOUS
  Administered 2017-10-16: 50 ug via INTRAVENOUS
  Administered 2017-10-16: 25 ug via INTRAVENOUS

## 2017-10-16 MED ORDER — FENTANYL CITRATE (PF) 100 MCG/2ML IJ SOLN
INTRAMUSCULAR | Status: AC
  Filled 2017-10-16: qty 2

## 2017-10-16 MED ORDER — MOXIFLOXACIN HCL 0.5 % OP SOLN
OPHTHALMIC | Status: AC
  Filled 2017-10-16: qty 3

## 2017-10-16 MED ORDER — MOXIFLOXACIN HCL 0.5 % OP SOLN
OPHTHALMIC | Status: DC | PRN
  Administered 2017-10-16: 1 [drp] via OPHTHALMIC

## 2017-10-16 MED ORDER — TRIAMCINOLONE ACETONIDE 40 MG/ML IJ SUSP
INTRAMUSCULAR | Status: DC | PRN
Start: 2017-10-16 — End: 2017-10-16
  Administered 2017-10-16: 40 mg

## 2017-10-16 MED ORDER — MOXIFLOXACIN HCL 0.5 % OP SOLN: 1.0000 [drp] | Freq: Once | OPHTHALMIC | Status: DC

## 2017-10-16 MED ORDER — MIDAZOLAM HCL 2 MG/2ML IJ SOLN
INTRAMUSCULAR | Status: AC
  Filled 2017-10-16: qty 2

## 2017-10-16 MED ORDER — SODIUM HYALURONATE 23 MG/ML IO SOLN
INTRAOCULAR | Status: DC | PRN
  Administered 2017-10-16: 0.6 mL via INTRAOCULAR

## 2017-10-16 MED ORDER — SODIUM CHLORIDE 0.9 % IV SOLN
INTRAVENOUS | Status: DC
  Administered 2017-10-16: 07:00:00 via INTRAVENOUS

## 2017-10-16 MED ORDER — LIDOCAINE HCL (PF) 4 % IJ SOLN
INTRAOCULAR | Status: DC | PRN
  Administered 2017-10-16: 4 mL via OPHTHALMIC

## 2017-10-16 MED ORDER — ARMC OPHTHALMIC DILATING DROPS
1.0000 "application " | OPHTHALMIC | Status: AC
  Administered 2017-10-16 (×3): 1 via OPHTHALMIC

## 2017-10-16 MED ORDER — POVIDONE-IODINE 5 % OP SOLN
OPHTHALMIC | Status: DC | PRN
  Administered 2017-10-16: 1 via OPHTHALMIC

## 2017-10-16 MED ORDER — LIDOCAINE HCL (PF) 4 % IJ SOLN
INTRAMUSCULAR | Status: AC
  Filled 2017-10-16: qty 5

## 2017-10-16 MED ORDER — TRIAMCINOLONE ACETONIDE INTRAVITREAL INJECTION 4 MG/0.1 ML
4.0000 mg | INTRAOCULAR | Status: DC
  Filled 2017-10-16: qty 0.1

## 2017-10-16 MED ORDER — MIDAZOLAM HCL 2 MG/2ML IJ SOLN
INTRAMUSCULAR | Status: DC | PRN
  Administered 2017-10-16 (×2): 1 mg via INTRAVENOUS

## 2017-10-16 MED ORDER — TRIAMCINOLONE ACETONIDE 40 MG/ML IJ SUSP
INTRAMUSCULAR | Status: AC
  Filled 2017-10-16: qty 1

## 2017-10-16 MED ORDER — EPINEPHRINE PF 1 MG/ML IJ SOLN
INTRAMUSCULAR | Status: AC
  Filled 2017-10-16: qty 1

## 2017-10-16 SURGICAL SUPPLY — 16 items
DISSECTOR HYDRO NUCLEUS 50X22 (MISCELLANEOUS) ×2 IMPLANT
GLOVE BIO SURGEON STRL SZ8 (GLOVE) ×2 IMPLANT
GLOVE BIOGEL M 6.5 STRL (GLOVE) ×2 IMPLANT
GLOVE SURG LX 7.5 STRW (GLOVE) ×1
GLOVE SURG LX STRL 7.5 STRW (GLOVE) ×1 IMPLANT
GOWN STRL REUS W/ TWL LRG LVL3 (GOWN DISPOSABLE) ×2 IMPLANT
GOWN STRL REUS W/TWL LRG LVL3 (GOWN DISPOSABLE) ×2
LABEL CATARACT MEDS ST (LABEL) ×2 IMPLANT
LENS IOL TECNIS ITEC 22.5 (Intraocular Lens) ×2 IMPLANT
PACK CATARACT (MISCELLANEOUS) ×2 IMPLANT
PACK CATARACT KING (MISCELLANEOUS) ×2 IMPLANT
PACK EYE AFTER SURG (MISCELLANEOUS) ×2 IMPLANT
SOL BSS BAG (MISCELLANEOUS) ×2
SOLUTION BSS BAG (MISCELLANEOUS) ×1 IMPLANT
WATER STERILE IRR 250ML POUR (IV SOLUTION) ×2 IMPLANT
WIPE NON LINTING 3.25X3.25 (MISCELLANEOUS) ×2 IMPLANT

## 2017-10-16 NOTE — Anesthesia Post-op Follow-up Note (Signed)
Anesthesia QCDR form completed.        

## 2017-10-16 NOTE — Anesthesia Procedure Notes (Signed)
Procedure Name: MAC Date/Time: 10/16/2017 8:13 AM Performed by: Johnna Acosta Pre-anesthesia Checklist: Patient identified, Emergency Drugs available, Suction available, Patient being monitored and Timeout performed Patient Re-evaluated:Patient Re-evaluated prior to induction Oxygen Delivery Method: Nasal cannula Preoxygenation: Pre-oxygenation with 100% oxygen

## 2017-10-16 NOTE — Transfer of Care (Signed)
Immediate Anesthesia Transfer of Care Note  Patient: Emma White  Procedure(s) Performed: CATARACT EXTRACTION PHACO AND INTRAOCULAR LENS PLACEMENT (IOC) (Right Eye) KENALOG INJECTION (Right Eye)  Patient Location: PACU  Anesthesia Type:MAC  Level of Consciousness: awake, alert  and oriented  Airway & Oxygen Therapy: Patient Spontanous Breathing  Post-op Assessment: Report given to RN and Post -op Vital signs reviewed and stable  Post vital signs: Reviewed and stable  Last Vitals:  Vitals:   10/16/17 0844 10/16/17 0845  BP: 130/60 130/60  Pulse: 73 75  Resp: 16 18  Temp: 36.8 C   SpO2: 92% 92%    Last Pain:  Vitals:   10/16/17 0845  TempSrc: Oral         Complications: No apparent anesthesia complications

## 2017-10-16 NOTE — Op Note (Signed)
OPERATIVE NOTE  Emma White [id number] 10/16/2017   PREOPERATIVE DIAGNOSIS:   1.  Nuclear sclerotic cataract right eye.  H25.11 2.  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   POSTOPERATIVE DIAGNOSIS:    same.     PROCEDURE:   1.  Phacoemusification with posterior chamber intraocular lens placement of the right eye   CPT [phone] 2.  Intravitreal injection of kenalog, CPT [id number]  LENS:   Implant Name Type Inv. Item Serial No. Manufacturer Lot No. LRB No. Used  LENS IOL DIOP 22.5 - [id number] Intraocular Lens LENS IOL DIOP 22.5 [id number] AMO   Right 1       PCB00 +22.5   ULTRASOUND TIME: 0 minutes 32.8 seconds.  CDE 2.96   SURGEON:  Benay Pillow, MD, MPH  ANESTHESIOLOGIST: Anesthesiologist: Gunnar Bulla, MD CRNA: Darlyne Russian, CRNA; Johnna Acosta, CRNA   ANESTHESIA:  Topical with tetracaine drops augmented with 1% preservative-free intracameral lidocaine.  ESTIMATED BLOOD LOSS: less than 1 mL.   COMPLICATIONS:  None.   DESCRIPTION OF PROCEDURE:  The patient was identified in the holding room and transported to the operating room and placed in the supine position under the operating microscope.  The right eye was identified as the operative eye and it was prepped and draped in the usual sterile ophthalmic fashion.   A 1.0 millimeter clear-corneal paracentesis was made at the 10:30 position. 0.5 ml of preservative-free 1% lidocaine with epinephrine was injected into the anterior chamber.  The anterior chamber was filled with Healon 5 viscoelastic.  A 2.4 millimeter keratome was used to make a near-clear corneal incision at the 8:00 position.  A curvilinear capsulorrhexis was made with a cystotome and capsulorrhexis forceps.  Balanced salt solution was used to hydrodissect and hydrodelineate the nucleus.   Phacoemulsification was then used in stop and chop fashion to remove the lens nucleus and epinucleus.  The remaining cortex was  then removed using the irrigation and aspiration handpiece. Healon was then placed into the capsular bag to distend it for lens placement.  A lens was then injected into the capsular bag.  The remaining viscoelastic was aspirated.   Wounds were hydrated with balanced salt solution.  The anterior chamber was inflated to a physiologic pressure with balanced salt solution.    Intracameral vigamox 0.1 mL undiluted was injected into the eye and a drop placed onto the ocular surface.  Calipers were used to mark 3.5 mm inferotemporal to the limbus.  Kenalog was injected intravitreally 0.1 mL 40mg /mL with a one cc syringe.  No wound leaks were noted.  The patient was taken to the recovery room in stable condition without complications of anesthesia or surgery  Benay Pillow 10/16/2017, 8:43 AM

## 2017-10-16 NOTE — Anesthesia Preprocedure Evaluation (Signed)
Anesthesia Evaluation  Patient identified by MRN, date of birth, ID band Patient awake    Reviewed: Allergy & Precautions, NPO status , Patient's Chart, lab work & pertinent test results, reviewed documented beta blocker date and time   Airway Mallampati: III  TM Distance: >3 FB     Dental  (+) Chipped   Pulmonary shortness of breath,           Cardiovascular hypertension, Pt. on medications      Neuro/Psych PSYCHIATRIC DISORDERS Anxiety Depression    GI/Hepatic   Endo/Other  diabetes, Type 2  Renal/GU      Musculoskeletal  (+) Arthritis ,   Abdominal   Peds  Hematology   Anesthesia Other Findings   Reproductive/Obstetrics                             Anesthesia Physical Anesthesia Plan  ASA: III  Anesthesia Plan: MAC   Post-op Pain Management:    Induction:   PONV Risk Score and Plan:   Airway Management Planned:   Additional Equipment:   Intra-op Plan:   Post-operative Plan:   Informed Consent: I have reviewed the patients History and Physical, chart, labs and discussed the procedure including the risks, benefits and alternatives for the proposed anesthesia with the patient or authorized representative who has indicated his/her understanding and acceptance.     Plan Discussed with: CRNA  Anesthesia Plan Comments:         Anesthesia Quick Evaluation

## 2017-10-16 NOTE — Anesthesia Postprocedure Evaluation (Signed)
Anesthesia Post Note  Patient: Emma White  Procedure(s) Performed: CATARACT EXTRACTION PHACO AND INTRAOCULAR LENS PLACEMENT (IOC) (Right Eye) KENALOG INJECTION (Right Eye)  Patient location during evaluation: PACU Anesthesia Type: MAC Level of consciousness: awake and alert Pain management: pain level controlled Vital Signs Assessment: post-procedure vital signs reviewed and stable Respiratory status: spontaneous breathing, nonlabored ventilation, respiratory function stable and patient connected to nasal cannula oxygen Cardiovascular status: stable and blood pressure returned to baseline Postop Assessment: no apparent nausea or vomiting Anesthetic complications: no     Last Vitals:  Vitals:   10/16/17 0845 10/16/17 0850  BP: 130/60 124/77  Pulse: 75 69  Resp: 18   Temp:    SpO2: 92%     Last Pain:  Vitals:   10/16/17 0845  TempSrc: Oral                 Darlyne Russian

## 2017-10-16 NOTE — Discharge Instructions (Signed)
Eye Surgery Discharge Instructions  Expect mild scratchy sensation or mild soreness. DO NOT RUB YOUR EYE!  The day of surgery:  Minimal physical activity, but bed rest is not required  No reading, computer work, or close hand work  No bending, lifting, or straining.  May watch TV  For 24 hours:  No driving, legal decisions, or alcoholic beverages  Safety precautions  Eat anything you prefer: It is better to start with liquids, then soup then solid foods.  _____ Eye patch should be worn until postoperative exam tomorrow.  ____ Solar shield eyeglasses should be worn for comfort in the sunlight/patch while sleeping  Resume all regular medications including aspirin or Coumadin if these were discontinued prior to surgery. You may shower, bathe, shave, or wash your hair. Tylenol may be taken for mild discomfort.  Call your doctor if you experience significant pain, nausea, or vomiting, fever > 101 or other signs of infection. [phone] or [phone] Specific instructions:  Follow-up Information    Eulogio Bear, MD Follow up.   Specialty:  Ophthalmology Why:  November 2 at 10:40am Contact information: [address] [place] [address] [phone]

## 2017-10-16 NOTE — H&P (Signed)
The History and Physical notes are on paper, have been signed, and are to be scanned.   I have examined the patient and there are no changes to the H&P.   Benay Pillow 10/16/2017 8:06 AM

## 2018-11-27 DIAGNOSIS — R059 Cough, unspecified: Secondary | ICD-10-CM | POA: Insufficient documentation

## 2018-11-27 DIAGNOSIS — E118 Type 2 diabetes mellitus with unspecified complications: Secondary | ICD-10-CM | POA: Insufficient documentation

## 2019-08-20 DIAGNOSIS — F32A Depression, unspecified: Secondary | ICD-10-CM | POA: Diagnosis present

## 2019-08-20 DIAGNOSIS — U071 COVID-19: Secondary | ICD-10-CM | POA: Insufficient documentation

## 2019-08-20 DIAGNOSIS — Z90710 Acquired absence of both cervix and uterus: Secondary | ICD-10-CM | POA: Insufficient documentation

## 2019-08-20 DIAGNOSIS — R0902 Hypoxemia: Secondary | ICD-10-CM | POA: Diagnosis present

## 2019-08-20 DIAGNOSIS — Z794 Long term (current) use of insulin: Secondary | ICD-10-CM | POA: Insufficient documentation

## 2019-08-20 DIAGNOSIS — E1169 Type 2 diabetes mellitus with other specified complication: Secondary | ICD-10-CM | POA: Insufficient documentation

## 2019-08-20 DIAGNOSIS — I10 Essential (primary) hypertension: Secondary | ICD-10-CM | POA: Diagnosis present

## 2019-08-24 DIAGNOSIS — G4701 Insomnia due to medical condition: Secondary | ICD-10-CM | POA: Insufficient documentation

## 2019-09-28 DIAGNOSIS — R7989 Other specified abnormal findings of blood chemistry: Secondary | ICD-10-CM | POA: Insufficient documentation

## 2020-04-07 DIAGNOSIS — H9190 Unspecified hearing loss, unspecified ear: Secondary | ICD-10-CM | POA: Insufficient documentation

## 2020-04-07 DIAGNOSIS — M25511 Pain in right shoulder: Secondary | ICD-10-CM | POA: Insufficient documentation

## 2020-04-07 DIAGNOSIS — Z1321 Encounter for screening for nutritional disorder: Secondary | ICD-10-CM | POA: Insufficient documentation

## 2020-04-07 DIAGNOSIS — E782 Mixed hyperlipidemia: Secondary | ICD-10-CM | POA: Insufficient documentation

## 2021-02-07 ENCOUNTER — Other Ambulatory Visit: Payer: Self-pay | Admitting: Physician Assistant

## 2021-02-07 DIAGNOSIS — Z1231 Encounter for screening mammogram for malignant neoplasm of breast: Secondary | ICD-10-CM

## 2021-07-10 ENCOUNTER — Encounter
Admission: RE | Disposition: A | Discharge status: Home / Self Care | Payer: Self-pay | Source: Home / Self Care | Attending: Gastroenterology

## 2021-07-10 ENCOUNTER — Ambulatory Visit: Payer: Medicare HMO | Admitting: Anesthesiology

## 2021-07-10 ENCOUNTER — Ambulatory Visit
Admission: RE | Admit: 2021-07-10 | Discharge: 2021-07-10 | Disposition: A | Discharge status: Home / Self Care | Payer: Medicare HMO | Attending: Gastroenterology | Admitting: Gastroenterology

## 2021-07-10 ENCOUNTER — Encounter: Payer: Self-pay | Admitting: *Deleted

## 2021-07-10 DIAGNOSIS — K2270 Barrett's esophagus without dysplasia: Secondary | ICD-10-CM | POA: Insufficient documentation

## 2021-07-10 DIAGNOSIS — K219 Gastro-esophageal reflux disease without esophagitis: Secondary | ICD-10-CM | POA: Insufficient documentation

## 2021-07-10 DIAGNOSIS — K2289 Other specified disease of esophagus: Secondary | ICD-10-CM | POA: Diagnosis not present

## 2021-07-10 DIAGNOSIS — R1013 Epigastric pain: Secondary | ICD-10-CM | POA: Diagnosis not present

## 2021-07-10 DIAGNOSIS — K529 Noninfective gastroenteritis and colitis, unspecified: Secondary | ICD-10-CM | POA: Diagnosis not present

## 2021-07-10 DIAGNOSIS — K259 Gastric ulcer, unspecified as acute or chronic, without hemorrhage or perforation: Secondary | ICD-10-CM | POA: Diagnosis not present

## 2021-07-10 DIAGNOSIS — K640 First degree hemorrhoids: Secondary | ICD-10-CM | POA: Diagnosis not present

## 2021-07-10 DIAGNOSIS — K5730 Diverticulosis of large intestine without perforation or abscess without bleeding: Secondary | ICD-10-CM | POA: Diagnosis not present

## 2021-07-10 DIAGNOSIS — D120 Benign neoplasm of cecum: Secondary | ICD-10-CM | POA: Insufficient documentation

## 2021-07-10 DIAGNOSIS — K2950 Unspecified chronic gastritis without bleeding: Secondary | ICD-10-CM | POA: Diagnosis not present

## 2021-07-10 HISTORY — PX: COLONOSCOPY WITH PROPOFOL: SHX5780

## 2021-07-10 HISTORY — PX: ESOPHAGOGASTRODUODENOSCOPY (EGD) WITH PROPOFOL: SHX5813

## 2021-07-10 LAB — GLUCOSE, CAPILLARY: Glucose-Capillary: 126 mg/dL — ABNORMAL HIGH (ref 70–99)

## 2021-07-10 SURGERY — ESOPHAGOGASTRODUODENOSCOPY (EGD) WITH PROPOFOL
Anesthesia: General

## 2021-07-10 MED ORDER — PROPOFOL 500 MG/50ML IV EMUL
INTRAVENOUS | Status: DC | PRN
  Administered 2021-07-10: 150 ug/kg/min via INTRAVENOUS

## 2021-07-10 MED ORDER — SODIUM CHLORIDE 0.9 % IV SOLN: INTRAVENOUS | Status: DC

## 2021-07-10 MED ORDER — PHENYLEPHRINE HCL (PRESSORS) 10 MG/ML IV SOLN
INTRAVENOUS | Status: AC
  Filled 2021-07-10: qty 1

## 2021-07-10 MED ORDER — PROPOFOL 500 MG/50ML IV EMUL
INTRAVENOUS | Status: AC
  Filled 2021-07-10: qty 50

## 2021-07-10 MED ORDER — LIDOCAINE HCL (PF) 2 % IJ SOLN
INTRAMUSCULAR | Status: AC
  Filled 2021-07-10: qty 5

## 2021-07-10 MED ORDER — LIDOCAINE HCL (CARDIAC) PF 100 MG/5ML IV SOSY
PREFILLED_SYRINGE | INTRAVENOUS | Status: DC | PRN
  Administered 2021-07-10: 50 mg via INTRAVENOUS

## 2021-07-10 MED ORDER — EPHEDRINE 5 MG/ML INJ
INTRAVENOUS | Status: AC
  Filled 2021-07-10: qty 5

## 2021-07-10 MED ORDER — PROPOFOL 10 MG/ML IV BOLUS
INTRAVENOUS | Status: AC
  Filled 2021-07-10: qty 20

## 2021-07-10 MED ORDER — EPHEDRINE SULFATE 50 MG/ML IJ SOLN
INTRAMUSCULAR | Status: DC | PRN
  Administered 2021-07-10: 10 mg via INTRAVENOUS

## 2021-07-10 MED ORDER — PHENYLEPHRINE HCL (PRESSORS) 10 MG/ML IV SOLN
INTRAVENOUS | Status: DC | PRN
  Administered 2021-07-10 (×2): 50 ug via INTRAVENOUS

## 2021-07-10 NOTE — Anesthesia Preprocedure Evaluation (Signed)
Anesthesia Evaluation  Patient identified by MRN, date of birth, ID band Patient awake    Reviewed: Allergy & Precautions, NPO status , Patient's Chart, lab work & pertinent test results  History of Anesthesia Complications Negative for: history of anesthetic complications  Airway Mallampati: II  TM Distance: >3 FB Neck ROM: Full    Dental  (+) Poor Dentition, Chipped   Pulmonary neg pulmonary ROS, neg sleep apnea, neg COPD,    breath sounds clear to auscultation- rhonchi (-) wheezing      Cardiovascular Exercise Tolerance: Good hypertension, Pt. on medications (-) CAD, (-) Past MI, (-) Cardiac Stents and (-) CABG  Rhythm:Regular Rate:Normal - Systolic murmurs and - Diastolic murmurs    Neuro/Psych neg Seizures PSYCHIATRIC DISORDERS Anxiety Depression negative neurological ROS     GI/Hepatic negative GI ROS, Neg liver ROS,   Endo/Other  diabetes, Insulin Dependent  Renal/GU negative Renal ROS     Musculoskeletal  (+) Arthritis ,   Abdominal (+) - obese,   Peds  Hematology negative hematology ROS (+)   Anesthesia Other Findings Past Medical History: No date: Anxiety No date: Arthritis     Comment:  hand No date: Depression No date: Diabetes mellitus without complication (HCC) No date: Dyspnea     Comment:  DOE No date: Hyperlipemia No date: Hypertension No date: Neuropathy   Reproductive/Obstetrics                             Anesthesia Physical Anesthesia Plan  ASA: 2  Anesthesia Plan: General   Post-op Pain Management:    Induction: Intravenous  PONV Risk Score and Plan: 2 and Propofol infusion  Airway Management Planned: Natural Airway  Additional Equipment:   Intra-op Plan:   Post-operative Plan:   Informed Consent: I have reviewed the patients History and Physical, chart, labs and discussed the procedure including the risks, benefits and alternatives for the  proposed anesthesia with the patient or authorized representative who has indicated his/her understanding and acceptance.     Dental advisory given  Plan Discussed with: CRNA and Anesthesiologist  Anesthesia Plan Comments:         Anesthesia Quick Evaluation

## 2021-07-10 NOTE — H&P (Signed)
Outpatient short stay form Pre-procedure 07/10/2021 7:46 AM Emma Miyamoto MD, MPH  Primary Physician: NP Tobin Chad  Reason for visit:  GERD/Diarrhea  History of present illness:   65 y/o lady with history of hypertension here for EGD/Colonoscopy for heart burn, nausea, vomiting, and diarrhea. Had 13 tubular adenoma's on colonoscopy done in 2017. History of hysterectomy. No blood thinners. No GI malignancies.    Current Facility-Administered Medications:    0.9 %  sodium chloride infusion, , Intravenous, Continuous, Malaka Ruffner, Hilton Cork, MD, Last Rate: 20 mL/hr at 07/10/21 0718, Continued from Pre-op at 07/10/21 0718  Medications Prior to Admission  Medication Sig Dispense Refill Last Dose   Aspirin-Acetaminophen 500-325 MG PACK Take 1 packet by mouth as needed.   Past Week   ibuprofen (ADVIL,MOTRIN) 200 MG tablet Take 400 mg by mouth as needed.   Past Month   pantoprazole (PROTONIX) 40 MG tablet Take 40 mg by mouth daily.   07/08/2021   sitaGLIPtin (JANUVIA) 100 MG tablet Take 100 mg by mouth daily.   07/08/2021   acetaminophen (TYLENOL) 650 MG CR tablet Take 1,300 mg by mouth 2 (two) times daily at 10 AM and 5 PM.   07/08/2021   aspirin 81 MG tablet Take 81 mg by mouth daily. With dinner   07/08/2021   citalopram (CELEXA) 20 MG tablet Take 20 mg by mouth daily. With dinner   07/08/2021   gabapentin (NEURONTIN) 800 MG tablet Take 800 mg by mouth 2 (two) times daily.    07/08/2021   HYDROcodone-acetaminophen (NORCO/VICODIN) 5-325 MG tablet Take 1 tablet by mouth every 6 (six) hours as needed for moderate pain.   07/08/2021   insulin aspart (NOVOLOG) 100 UNIT/ML injection Inject 8 Units into the skin 2 (two) times daily.    07/08/2021   insulin detemir (LEVEMIR) 100 UNIT/ML injection Inject 30 Units into the skin at bedtime.    07/08/2021   lisinopril (PRINIVIL,ZESTRIL) 2.5 MG tablet Take 2.5 mg by mouth daily. With dinner   07/08/2021   metFORMIN (GLUCOPHAGE) 500 MG tablet Take 1,000 mg by mouth 2  (two) times daily with a meal.   07/08/2021   Multiple Vitamins-Minerals (CENTRUM SILVER PO) Take 1 tablet by mouth daily.   07/08/2021   Potassium 99 MG TABS Take 1 tablet by mouth daily. With breakfast   07/08/2021   pravastatin (PRAVACHOL) 10 MG tablet Take 10 mg by mouth daily. With dinner   07/08/2021     No Known Allergies   Past Medical History:  Diagnosis Date   Anxiety    Arthritis    hand   Depression    Diabetes mellitus without complication ([place])    Dyspnea    DOE   Hyperlipemia    Hypertension    Neuropathy     Review of systems:  Otherwise negative.    Physical Exam  Gen: Alert, oriented. Appears stated age.  HEENT: PERRLA. Lungs: No respiratory distress CV: RRR Abd: soft, benign, no masses Ext: No edema    Planned procedures: Proceed with EGD/colonoscopy. The patient understands the nature of the planned procedure, indications, risks, alternatives and potential complications including but not limited to bleeding, infection, perforation, damage to internal organs and possible oversedation/side effects from anesthesia. The patient agrees and gives consent to proceed.  Please refer to procedure notes for findings, recommendations and patient disposition/instructions.     Emma Miyamoto MD, MPH Gastroenterology 07/10/2021  7:46 AM

## 2021-07-10 NOTE — Op Note (Signed)
[facility] Gastroenterology Patient Name: Emma White Procedure Date: 07/10/2021 7:19 AM MRN: [id number] Account #: [id number] Date of Birth: [birth year] Admit Type: Outpatient Age: 65 Room: [facility] ENDO ROOM 1 Gender: Female Note Status: Finalized Procedure:             Colonoscopy Indications:           Chronic diarrhea Providers:             Andrey Farmer MD, MD Referring MD:          Terance Hart. Tobin Chad (Referring MD) Medicines:             Monitored Anesthesia Care Complications:         No immediate complications. Estimated blood loss:                         Minimal. Procedure:             Pre-Anesthesia Assessment:                        - Prior to the procedure, a History and Physical was                         performed, and patient medications and allergies were                         reviewed. The patient is competent. The risks and                         benefits of the procedure and the sedation options and                         risks were discussed with the patient. All questions                         were answered and informed consent was obtained.                         Patient identification and proposed procedure were                         verified by the physician, the nurse, the anesthetist                         and the technician in the endoscopy suite. Mental                         Status Examination: alert and oriented. Airway                         Examination: normal oropharyngeal airway and neck                         mobility. Respiratory Examination: clear to                         auscultation. CV Examination: normal. Prophylactic                         Antibiotics: The patient  does not require prophylactic                         antibiotics. Prior Anticoagulants: The patient has                         taken no previous anticoagulant or antiplatelet                         agents. ASA Grade Assessment: III - A patient  with                         severe systemic disease. After reviewing the risks and                         benefits, the patient was deemed in satisfactory                         condition to undergo the procedure. The anesthesia                         plan was to use monitored anesthesia care (MAC).                         Immediately prior to administration of medications,                         the patient was re-assessed for adequacy to receive                         sedatives. The heart rate, respiratory rate, oxygen                         saturations, blood pressure, adequacy of pulmonary                         ventilation, and response to care were monitored                         throughout the procedure. The physical status of the                         patient was re-assessed after the procedure.                        After obtaining informed consent, the colonoscope was                         passed under direct vision. Throughout the procedure,                         the patient's blood pressure, pulse, and oxygen                         saturations were monitored continuously. The                         Colonoscope was introduced through the anus and  advanced to the the cecum, identified by appendiceal                         orifice and ileocecal valve. The colonoscopy was                         somewhat difficult due to restricted mobility of the                         colon. The patient tolerated the procedure well. The                         quality of the bowel preparation was inadequate. Findings:      The perianal and digital rectal examinations were normal.      Two sessile polyps were found in the ileocecal valve. The polyps were 3       to 4 mm in size. These polyps were removed with a cold snare. Resection       and retrieval were complete. Estimated blood loss was minimal.      A large amount of stool was found in the  ascending colon and in the       cecum, precluding visualization.      Normal mucosa was found in the entire colon. Biopsies for histology were       taken with a cold forceps from the entire colon for evaluation of       microscopic colitis. Estimated blood loss was minimal.      A few small-mouthed diverticula were found in the sigmoid colon.      Internal hemorrhoids were found during retroflexion. The hemorrhoids       were Grade I (internal hemorrhoids that do not prolapse). Impression:            - Preparation of the colon was inadequate.                        - Two 3 to 4 mm polyps at the ileocecal valve, removed                         with a cold snare. Resected and retrieved.                        - Stool in the ascending colon and in the cecum.                        - Normal mucosa in the entire examined colon. Biopsied.                        - Diverticulosis in the sigmoid colon.                        - Internal hemorrhoids. Recommendation:        - Repeat colonoscopy in 3-6 months because the bowel                         preparation was suboptimal.                        - Return to referring physician as previously  scheduled.                        - Await pathology results.                        - Return to referring physician as previously                         scheduled. Procedure Code(s):     --- Professional ---                        [phone], Colonoscopy, flexible; with removal of                         tumor(s), polyp(s), or other lesion(s) by snare                         technique                        45380, 74, Colonoscopy, flexible; with biopsy, single                         or multiple Diagnosis Code(s):     --- Professional ---                        K64.0, First degree hemorrhoids                        K63.5, Polyp of colon                        K52.9, Noninfective gastroenteritis and colitis,                          unspecified                        K57.30, Diverticulosis of large intestine without                         perforation or abscess without bleeding CPT copyright 2019 American Medical Association. All rights reserved. The codes documented in this report are preliminary and upon coder review may  be revised to meet current compliance requirements. Andrey Farmer MD, MD 07/10/2021 8:39:36 AM Number of Addenda: 0 Note Initiated On: 07/10/2021 7:19 AM Scope Withdrawal Time: 0 hours 9 minutes 40 seconds  Total Procedure Duration: 0 hours 20 minutes 14 seconds  Estimated Blood Loss:  Estimated blood loss was minimal.      [facility]

## 2021-07-10 NOTE — Interval H&P Note (Signed)
History and Physical Interval Note:  07/10/2021 7:49 AM  Emma White  has presented today for surgery, with the diagnosis of GERD, history of polyps.  The various methods of treatment have been discussed with the patient and family. After consideration of risks, benefits and other options for treatment, the patient has consented to  Procedure(s) with comments: ESOPHAGOGASTRODUODENOSCOPY (EGD) WITH PROPOFOL (N/A) - IDDM COLONOSCOPY WITH PROPOFOL (N/A) as a surgical intervention.  The patient's history has been reviewed, patient examined, no change in status, stable for surgery.  I have reviewed the patient's chart and labs.  Questions were answered to the patient's satisfaction.     Lesly Rubenstein  Ok to proceed with EGD/Colonoscopy

## 2021-07-10 NOTE — Anesthesia Postprocedure Evaluation (Signed)
Anesthesia Post Note  Patient: [name]  Procedure(s) Performed: ESOPHAGOGASTRODUODENOSCOPY (EGD) WITH PROPOFOL COLONOSCOPY WITH PROPOFOL  Patient location during evaluation: Endoscopy Anesthesia Type: General Level of consciousness: awake and alert and oriented Pain management: pain level controlled Vital Signs Assessment: post-procedure vital signs reviewed and stable Respiratory status: spontaneous breathing, nonlabored ventilation and respiratory function stable Cardiovascular status: blood pressure returned to baseline and stable Postop Assessment: no signs of nausea or vomiting Anesthetic complications: no   No notable events documented.   Last Vitals:  Vitals:   07/10/21 0842 07/10/21 0852  BP: 113/62 (!) 123/48  Pulse: 75 72  Resp: 14 17  Temp:    SpO2: 97% 100%    Last Pain:  Vitals:   07/10/21 0852  TempSrc:   PainSc: 0-No pain                 Petrea Fredenburg

## 2021-07-10 NOTE — Transfer of Care (Signed)
Immediate Anesthesia Transfer of Care Note  Patient: Emma White  Procedure(s) Performed: ESOPHAGOGASTRODUODENOSCOPY (EGD) WITH PROPOFOL COLONOSCOPY WITH PROPOFOL  Patient Location: PACU  Anesthesia Type:General  Level of Consciousness: awake and sedated  Airway & Oxygen Therapy: Patient Spontanous Breathing and Patient connected to nasal cannula oxygen  Post-op Assessment: Report given to RN and Post -op Vital signs reviewed and stable  Post vital signs: Reviewed and stable  Last Vitals:  Vitals Value Taken Time  BP    Temp    Pulse    Resp    SpO2      Last Pain:  Vitals:   07/10/21 0705  TempSrc: Temporal  PainSc: 0-No pain         Complications: No notable events documented.

## 2021-07-10 NOTE — Anesthesia Procedure Notes (Signed)
Date/Time: 07/10/2021 8:00 AM Performed by: Vaughan Sine Pre-anesthesia Checklist: Patient identified, Emergency Drugs available, Suction available, Patient being monitored and Timeout performed Patient Re-evaluated:Patient Re-evaluated prior to induction Oxygen Delivery Method: Nasal cannula Preoxygenation: Pre-oxygenation with 100% oxygen Induction Type: IV induction Airway Equipment and Method: Bite block Placement Confirmation: positive ETCO2 and CO2 detector

## 2021-07-10 NOTE — Op Note (Signed)
[facility] Patient Name: Emma White Procedure Date: 07/10/2021 7:20 AM MRN: [id number] Account #: [id number] Date of Birth: 1956-11-12 Admit Type: Outpatient Age: 65 Room: [facility] ENDO ROOM 1 Gender: Female Note Status: Finalized Procedure:             Upper GI endoscopy Indications:           Epigastric abdominal pain, Gastro-esophageal reflux                         disease Providers:             Andrey Farmer MD, MD Referring MD:          Terance Hart. Tobin Chad (Referring MD) Medicines:             Monitored Anesthesia Care Complications:         No immediate complications. Estimated blood loss:                         Minimal. Procedure:             Pre-Anesthesia Assessment:                        - Prior to the procedure, a History and Physical was                         performed, and patient medications and allergies were                         reviewed. The patient is competent. The risks and                         benefits of the procedure and the sedation options and                         risks were discussed with the patient. All questions                         were answered and informed consent was obtained.                         Patient identification and proposed procedure were                         verified by the physician, the nurse, the anesthetist                         and the technician in the endoscopy suite. Mental                         Status Examination: alert and oriented. Airway                         Examination: normal oropharyngeal airway and neck                         mobility. Respiratory Examination: clear to                         auscultation. CV  Examination: normal. Prophylactic                         Antibiotics: The patient does not require prophylactic                         antibiotics. Prior Anticoagulants: The patient has                         taken no previous anticoagulant or  antiplatelet                         agents. ASA Grade Assessment: III - A patient with                         severe systemic disease. After reviewing the risks and                         benefits, the patient was deemed in satisfactory                         condition to undergo the procedure. The anesthesia                         plan was to use monitored anesthesia care (MAC).                         Immediately prior to administration of medications,                         the patient was re-assessed for adequacy to receive                         sedatives. The heart rate, respiratory rate, oxygen                         saturations, blood pressure, adequacy of pulmonary                         ventilation, and response to care were monitored                         throughout the procedure. The physical status of the                         patient was re-assessed after the procedure.                        After obtaining informed consent, the endoscope was                         passed under direct vision. Throughout the procedure,                         the patient's blood pressure, pulse, and oxygen                         saturations were monitored continuously. The Endoscope  was introduced through the mouth, and advanced to the                         second part of duodenum. The upper GI endoscopy was                         accomplished without difficulty. The patient tolerated                         the procedure well. Findings:      One tongue of salmon-colored mucosa was present. The maximum       longitudinal extent of these esophageal mucosal changes was 1 cm in       length. Biopsies were taken with a cold forceps for histology. Estimated       blood loss was minimal.      The exam of the esophagus was otherwise normal.      A small amount of food (residue) was found in the gastric body.      One non-bleeding superficial gastric ulcer  with no stigmata of bleeding       was found at the incisura. The lesion was 4 mm in largest dimension.      Localized moderately erythematous mucosa without bleeding was found in       the gastric antrum. This was associated with the pylorus causing a mild       narrowing. Biopsies were taken with a cold forceps for Helicobacter       pylori testing. Estimated blood loss was minimal.      The examined duodenum was normal. Impression:            - Salmon-colored mucosa suspicious for short-segment                         Barrett's esophagus. Biopsied.                        - A small amount of food (residue) in the stomach.                        - Non-bleeding gastric ulcer with no stigmata of                         bleeding.                        - Erythematous mucosa in the antrum. Biopsied.                        - Normal examined duodenum. Recommendation:        - Use a proton pump inhibitor PO BID.                        - No ibuprofen, naproxen, or other non-steroidal                         anti-inflammatory drugs.                        - Await pathology results.                        -  Repeat upper endoscopy in 3 months to check healing.                        - Perform a colonoscopy today. Procedure Code(s):     --- Professional ---                        [phone], Esophagogastroduodenoscopy, flexible,                         transoral; with biopsy, single or multiple Diagnosis Code(s):     --- Professional ---                        K22.8, Other specified diseases of esophagus                        K25.9, Gastric ulcer, unspecified as acute or chronic,                         without hemorrhage or perforation                        K31.89, Other diseases of stomach and duodenum                        R10.13, Epigastric pain                        K21.9, Gastro-esophageal reflux disease without                         esophagitis CPT copyright 2019 American Medical  Association. All rights reserved. The codes documented in this report are preliminary and upon coder review may  be revised to meet current compliance requirements. Andrey Farmer MD, MD 07/10/2021 8:34:33 AM Number of Addenda: 0 Note Initiated On: 07/10/2021 7:20 AM Estimated Blood Loss:  Estimated blood loss was minimal.      [facility]

## 2021-07-11 ENCOUNTER — Encounter: Payer: Self-pay | Admitting: Gastroenterology

## 2021-07-12 LAB — SURGICAL PATHOLOGY

## 2021-08-21 DIAGNOSIS — S8000XA Contusion of unspecified knee, initial encounter: Secondary | ICD-10-CM | POA: Insufficient documentation

## 2021-09-11 ENCOUNTER — Ambulatory Visit
Admission: RE | Admit: 2021-09-11 | Discharge: 2021-09-11 | Disposition: A | Discharge status: Home / Self Care | Payer: Medicare HMO | Source: Ambulatory Visit | Attending: Physician Assistant | Admitting: Physician Assistant

## 2021-09-11 ENCOUNTER — Other Ambulatory Visit: Payer: Self-pay

## 2021-09-11 DIAGNOSIS — Z1231 Encounter for screening mammogram for malignant neoplasm of breast: Secondary | ICD-10-CM

## 2021-09-21 ENCOUNTER — Other Ambulatory Visit: Payer: Self-pay | Admitting: Physician Assistant

## 2021-09-21 DIAGNOSIS — N6489 Other specified disorders of breast: Secondary | ICD-10-CM

## 2021-09-21 DIAGNOSIS — R928 Other abnormal and inconclusive findings on diagnostic imaging of breast: Secondary | ICD-10-CM | POA: Insufficient documentation

## 2021-10-01 ENCOUNTER — Other Ambulatory Visit: Payer: Self-pay

## 2021-10-01 ENCOUNTER — Ambulatory Visit
Admission: RE | Admit: 2021-10-01 | Discharge: 2021-10-01 | Disposition: A | Discharge status: Home / Self Care | Payer: Medicare HMO | Source: Ambulatory Visit | Attending: Physician Assistant | Admitting: Physician Assistant

## 2021-10-01 DIAGNOSIS — R928 Other abnormal and inconclusive findings on diagnostic imaging of breast: Secondary | ICD-10-CM | POA: Diagnosis not present

## 2021-10-01 DIAGNOSIS — N6489 Other specified disorders of breast: Secondary | ICD-10-CM

## 2021-10-03 ENCOUNTER — Other Ambulatory Visit: Payer: Self-pay | Admitting: Physician Assistant

## 2021-10-03 DIAGNOSIS — N6489 Other specified disorders of breast: Secondary | ICD-10-CM

## 2021-10-03 DIAGNOSIS — R928 Other abnormal and inconclusive findings on diagnostic imaging of breast: Secondary | ICD-10-CM

## 2021-10-10 ENCOUNTER — Ambulatory Visit
Admission: RE | Admit: 2021-10-10 | Discharge: 2021-10-10 | Disposition: A | Discharge status: Home / Self Care | Payer: Medicare HMO | Source: Ambulatory Visit | Attending: Physician Assistant | Admitting: Physician Assistant

## 2021-10-10 ENCOUNTER — Other Ambulatory Visit: Payer: Self-pay

## 2021-10-10 DIAGNOSIS — R928 Other abnormal and inconclusive findings on diagnostic imaging of breast: Secondary | ICD-10-CM | POA: Insufficient documentation

## 2021-10-10 DIAGNOSIS — N6489 Other specified disorders of breast: Secondary | ICD-10-CM

## 2021-10-30 ENCOUNTER — Encounter
Admission: RE | Disposition: A | Discharge status: Home / Self Care | Payer: Self-pay | Source: Home / Self Care | Attending: Gastroenterology

## 2021-10-30 ENCOUNTER — Other Ambulatory Visit: Payer: Self-pay

## 2021-10-30 ENCOUNTER — Ambulatory Visit: Payer: Medicare HMO | Admitting: Registered Nurse

## 2021-10-30 ENCOUNTER — Encounter: Payer: Self-pay | Admitting: *Deleted

## 2021-10-30 ENCOUNTER — Ambulatory Visit
Admission: RE | Admit: 2021-10-30 | Discharge: 2021-10-30 | Disposition: A | Discharge status: Home / Self Care | Payer: Medicare HMO | Attending: Gastroenterology | Admitting: Gastroenterology

## 2021-10-30 DIAGNOSIS — K219 Gastro-esophageal reflux disease without esophagitis: Secondary | ICD-10-CM | POA: Insufficient documentation

## 2021-10-30 DIAGNOSIS — E1140 Type 2 diabetes mellitus with diabetic neuropathy, unspecified: Secondary | ICD-10-CM | POA: Insufficient documentation

## 2021-10-30 DIAGNOSIS — M1990 Unspecified osteoarthritis, unspecified site: Secondary | ICD-10-CM | POA: Insufficient documentation

## 2021-10-30 DIAGNOSIS — Z86010 Personal history of colonic polyps: Secondary | ICD-10-CM | POA: Diagnosis present

## 2021-10-30 DIAGNOSIS — Z794 Long term (current) use of insulin: Secondary | ICD-10-CM | POA: Diagnosis not present

## 2021-10-30 DIAGNOSIS — D121 Benign neoplasm of appendix: Secondary | ICD-10-CM | POA: Diagnosis not present

## 2021-10-30 DIAGNOSIS — K2950 Unspecified chronic gastritis without bleeding: Secondary | ICD-10-CM | POA: Insufficient documentation

## 2021-10-30 DIAGNOSIS — K31A11 Gastric intestinal metaplasia without dysplasia, involving the antrum: Secondary | ICD-10-CM | POA: Insufficient documentation

## 2021-10-30 DIAGNOSIS — K6389 Other specified diseases of intestine: Secondary | ICD-10-CM | POA: Diagnosis not present

## 2021-10-30 DIAGNOSIS — F419 Anxiety disorder, unspecified: Secondary | ICD-10-CM | POA: Insufficient documentation

## 2021-10-30 DIAGNOSIS — I10 Essential (primary) hypertension: Secondary | ICD-10-CM | POA: Insufficient documentation

## 2021-10-30 DIAGNOSIS — Z1211 Encounter for screening for malignant neoplasm of colon: Secondary | ICD-10-CM | POA: Insufficient documentation

## 2021-10-30 DIAGNOSIS — K259 Gastric ulcer, unspecified as acute or chronic, without hemorrhage or perforation: Secondary | ICD-10-CM | POA: Diagnosis not present

## 2021-10-30 DIAGNOSIS — F32A Depression, unspecified: Secondary | ICD-10-CM | POA: Insufficient documentation

## 2021-10-30 DIAGNOSIS — K5730 Diverticulosis of large intestine without perforation or abscess without bleeding: Secondary | ICD-10-CM | POA: Diagnosis not present

## 2021-10-30 DIAGNOSIS — E785 Hyperlipidemia, unspecified: Secondary | ICD-10-CM | POA: Insufficient documentation

## 2021-10-30 HISTORY — PX: COLONOSCOPY WITH PROPOFOL: SHX5780

## 2021-10-30 HISTORY — PX: ESOPHAGOGASTRODUODENOSCOPY (EGD) WITH PROPOFOL: SHX5813

## 2021-10-30 LAB — GLUCOSE, CAPILLARY: Glucose-Capillary: 207 mg/dL — ABNORMAL HIGH (ref 70–99)

## 2021-10-30 SURGERY — ESOPHAGOGASTRODUODENOSCOPY (EGD) WITH PROPOFOL
Anesthesia: General

## 2021-10-30 MED ORDER — PHENYLEPHRINE HCL-NACL 20-0.9 MG/250ML-% IV SOLN
INTRAVENOUS | Status: AC
  Filled 2021-10-30: qty 250

## 2021-10-30 MED ORDER — PROPOFOL 500 MG/50ML IV EMUL
INTRAVENOUS | Status: AC
  Filled 2021-10-30: qty 350

## 2021-10-30 MED ORDER — LIDOCAINE HCL (CARDIAC) PF 100 MG/5ML IV SOSY
PREFILLED_SYRINGE | INTRAVENOUS | Status: DC | PRN
  Administered 2021-10-30: 100 mg via INTRAVENOUS

## 2021-10-30 MED ORDER — STERILE WATER FOR IRRIGATION IR SOLN
Status: DC | PRN
  Administered 2021-10-30: 100 mL

## 2021-10-30 MED ORDER — PROPOFOL 500 MG/50ML IV EMUL
INTRAVENOUS | Status: DC | PRN
  Administered 2021-10-30: 150 ug/kg/min via INTRAVENOUS

## 2021-10-30 MED ORDER — PROPOFOL 10 MG/ML IV BOLUS
INTRAVENOUS | Status: DC | PRN
  Administered 2021-10-30: 60 mg via INTRAVENOUS

## 2021-10-30 MED ORDER — LIDOCAINE HCL (PF) 2 % IJ SOLN
INTRAMUSCULAR | Status: AC
  Filled 2021-10-30: qty 35

## 2021-10-30 MED ORDER — SODIUM CHLORIDE 0.9 % IV SOLN: INTRAVENOUS | Status: DC

## 2021-10-30 NOTE — Op Note (Signed)
[facility]astroenterology Patient Name: Emma White Procedure Date: 10/30/2021 7:22 AM MRN: [id number] Account #: [id number] Date of Birth: 1956-01-22 Admit Type: Outpatient Age: 65 Room: [facility] ENDO ROOM 1 Gender: Female Note Status: Finalized Instrument Name: Altamese Cabal Endoscope [id number] Procedure:             Upper GI endoscopy Indications:           Gastric ulcer Providers:             Andrey Farmer MD, MD Medicines:             Monitored Anesthesia Care Complications:         No immediate complications. Estimated blood loss:                         Minimal. Procedure:             Pre-Anesthesia Assessment:                        - Prior to the procedure, a History and Physical was                         performed, and patient medications and allergies were                         reviewed. The patient is competent. The risks and                         benefits of the procedure and the sedation options and                         risks were discussed with the patient. All questions                         were answered and informed consent was obtained.                         Patient identification and proposed procedure were                         verified by the physician, the nurse, the anesthetist                         and the technician in the endoscopy suite. Mental                         Status Examination: alert and oriented. Airway                         Examination: normal oropharyngeal airway and neck                         mobility. Respiratory Examination: clear to                         auscultation. CV Examination: normal. Prophylactic                         Antibiotics: The patient does not require prophylactic  antibiotics. Prior Anticoagulants: The patient has                         taken no previous anticoagulant or antiplatelet                         agents. ASA Grade Assessment: II - A patient with mild                          systemic disease. After reviewing the risks and                         benefits, the patient was deemed in satisfactory                         condition to undergo the procedure. The anesthesia                         plan was to use monitored anesthesia care (MAC).                         Immediately prior to administration of medications,                         the patient was re-assessed for adequacy to receive                         sedatives. The heart rate, respiratory rate, oxygen                         saturations, blood pressure, adequacy of pulmonary                         ventilation, and response to care were monitored                         throughout the procedure. The physical status of the                         patient was re-assessed after the procedure.                        After obtaining informed consent, the endoscope was                         passed under direct vision. Throughout the procedure,                         the patient's blood pressure, pulse, and oxygen                         saturations were monitored continuously. The Endoscope                         was introduced through the mouth, and advanced to the                         second part of duodenum. The upper GI endoscopy was  accomplished without difficulty. The patient tolerated                         the procedure well. Findings:      The examined esophagus was normal.      A single localized 3 mm erosion with no stigmata of recent bleeding was       found on the lesser curvature of the gastric antrum. Biopsies were taken       with a cold forceps for histology. Estimated blood loss was minimal.       This appeared improved in appearance from last endoscopy but not       completely healed.      A small amount of food (residue) was found in the gastric body.      The exam of the stomach was otherwise normal.      The examined duodenum  was normal. Impression:            - Normal esophagus.                        - Erosive gastropathy with no stigmata of recent                         bleeding. Biopsied.                        - A small amount of food (residue) in the stomach.                        - Normal examined duodenum. Recommendation:        - No ibuprofen, naproxen, or other non-steroidal                         anti-inflammatory drugs.                        - Await pathology results.                        - Perform a colonoscopy today. Procedure Code(s):     --- Professional ---                        [phone], Esophagogastroduodenoscopy, flexible,                         transoral; with biopsy, single or multiple Diagnosis Code(s):     --- Professional ---                        K31.89, Other diseases of stomach and duodenum                        K25.9, Gastric ulcer, unspecified as acute or chronic,                         without hemorrhage or perforation CPT copyright 2019 American Medical Association. All rights reserved. The codes documented in this report are preliminary and upon coder review may  be revised to meet current compliance requirements. Andrey Farmer MD, MD 10/30/2021 8:29:50 AM Number of Addenda: 0 Note Initiated On: 10/30/2021 7:22 AM  Estimated Blood Loss:  Estimated blood loss was minimal.      [facility]

## 2021-10-30 NOTE — Op Note (Signed)
[facility] Patient Name: Emma White Procedure Date: 10/30/2021 7:22 AM MRN: [id number] Account #: [id number] Date of Birth: Aug 20, 1956 Admit Type: Outpatient Age: 65 Room: [facility] ENDO ROOM 1 Gender: Female Note Status: Finalized Instrument Name: Peds Colonoscope [id number] Procedure:             Colonoscopy Indications:           Surveillance: Personal history of adenomatous polyps,                         inadequate prep on last colonoscopy (less than 1 year                         ago) Providers:             Andrey Farmer MD, MD Medicines:             Monitored Anesthesia Care Complications:         No immediate complications. Estimated blood loss:                         Minimal. Procedure:             Pre-Anesthesia Assessment:                        - Prior to the procedure, a History and Physical was                         performed, and patient medications and allergies were                         reviewed. The patient is competent. The risks and                         benefits of the procedure and the sedation options and                         risks were discussed with the patient. All questions                         were answered and informed consent was obtained.                         Patient identification and proposed procedure were                         verified by the physician, the nurse, the anesthetist                         and the technician in the endoscopy suite. Mental                         Status Examination: alert and oriented. Airway                         Examination: normal oropharyngeal airway and neck                         mobility. Respiratory Examination: clear to  auscultation. CV Examination: normal. Prophylactic                         Antibiotics: The patient does not require prophylactic                         antibiotics. Prior Anticoagulants: The patient has                          taken no previous anticoagulant or antiplatelet                         agents. ASA Grade Assessment: II - A patient with mild                         systemic disease. After reviewing the risks and                         benefits, the patient was deemed in satisfactory                         condition to undergo the procedure. The anesthesia                         plan was to use monitored anesthesia care (MAC).                         Immediately prior to administration of medications,                         the patient was re-assessed for adequacy to receive                         sedatives. The heart rate, respiratory rate, oxygen                         saturations, blood pressure, adequacy of pulmonary                         ventilation, and response to care were monitored                         throughout the procedure. The physical status of the                         patient was re-assessed after the procedure.                        After obtaining informed consent, the colonoscope was                         passed under direct vision. Throughout the procedure,                         the patient's blood pressure, pulse, and oxygen                         saturations were monitored continuously. The  Colonoscope was introduced through the anus and                         advanced to the the cecum, identified by appendiceal                         orifice and ileocecal valve. The colonoscopy was                         performed without difficulty. The patient tolerated                         the procedure well. The quality of the bowel                         preparation was good. Findings:      The perianal and digital rectal examinations were normal.      A localized area of granular mucosa was found at the appendiceal       orifice. This appeared to extend into the appendiceal orifice. Biopsies       were taken with a cold  forceps for histology. Estimated blood loss was       minimal.      Scattered small-mouthed diverticula were found in the sigmoid colon,       descending colon and ascending colon.      The exam was otherwise without abnormality on direct and retroflexion       views. Impression:            - Granularity at the appendiceal orifice. Biopsied.                        - Diverticulosis in the sigmoid colon, in the                         descending colon and in the ascending colon.                        - The examination was otherwise normal on direct and                         retroflexion views. Recommendation:        - Discharge patient to home.                        - Resume previous diet.                        - Continue present medications.                        - Await pathology results.                        - Repeat colonoscopy for surveillance based on                         pathology results.                        - Return to referring physician as previously  scheduled. Procedure Code(s):     --- Professional ---                        [phone], Colonoscopy, flexible; with biopsy, single or                         multiple Diagnosis Code(s):     --- Professional ---                        Z86.010, Personal history of colonic polyps                        K63.89, Other specified diseases of intestine                        K57.30, Diverticulosis of large intestine without                         perforation or abscess without bleeding CPT copyright 2019 American Medical Association. All rights reserved. The codes documented in this report are preliminary and upon coder review may  be revised to meet current compliance requirements. Andrey Farmer MD, MD 10/30/2021 8:35:21 AM Number of Addenda: 0 Note Initiated On: 10/30/2021 7:22 AM Scope Withdrawal Time: 0 hours 10 minutes 1 second  Total Procedure Duration: 0 hours 19 minutes 24 seconds   Estimated Blood Loss:  Estimated blood loss was minimal.      [facility]

## 2021-10-30 NOTE — Transfer of Care (Signed)
Immediate Anesthesia Transfer of Care Note  Patient: Emma White  Procedure(s) Performed: ESOPHAGOGASTRODUODENOSCOPY (EGD) WITH PROPOFOL COLONOSCOPY WITH PROPOFOL  Patient Location: Endoscopy Unit  Anesthesia Type:General  Level of Consciousness: drowsy  Airway & Oxygen Therapy: Patient Spontanous Breathing  Post-op Assessment: Report given to RN and Post -op Vital signs reviewed and stable  Post vital signs: Reviewed and stable  Last Vitals:  Vitals Value Taken Time  BP 111/66 10/30/21 0824  Temp    Pulse 64 10/30/21 0824  Resp 15 10/30/21 0824  SpO2 96 % 10/30/21 0824  Vitals shown include unvalidated device data.  Last Pain:  Vitals:   10/30/21 0705  TempSrc: Temporal  PainSc: 0-No pain         Complications: No notable events documented.

## 2021-10-30 NOTE — Anesthesia Preprocedure Evaluation (Addendum)
Anesthesia Evaluation  Patient identified by MRN, date of birth, ID band Patient awake    Reviewed: Allergy & Precautions, NPO status , Patient's Chart, lab work & pertinent test results  History of Anesthesia Complications Negative for: history of anesthetic complications  Airway Mallampati: II  TM Distance: >3 FB Neck ROM: Full    Dental  (+) Poor Dentition, Chipped, Missing   Pulmonary shortness of breath and with exertion, neg sleep apnea, neg COPD,    breath sounds clear to auscultation- rhonchi (-) wheezing      Cardiovascular Exercise Tolerance: Good hypertension, Pt. on medications (-) CAD, (-) Past MI, (-) Cardiac Stents and (-) CABG  Rhythm:Regular Rate:Normal - Systolic murmurs and - Diastolic murmurs    Neuro/Psych neg Seizures PSYCHIATRIC DISORDERS Anxiety Depression negative neurological ROS     GI/Hepatic Neg liver ROS, Bowel prep,GERD  Medicated and Controlled,  Endo/Other  diabetes, Well Controlled, Insulin Dependent  Renal/GU negative Renal ROS     Musculoskeletal  (+) Arthritis , Osteoarthritis,    Abdominal (+) - obese,   Peds  Hematology negative hematology ROS (+)   Anesthesia Other Findings Past Medical History: No date: Anxiety No date: Arthritis     Comment:  hand No date: Depression No date: Diabetes mellitus without complication (HCC) No date: Dyspnea     Comment:  DOE No date: Hyperlipemia No date: Hypertension No date: Neuropathy   Reproductive/Obstetrics                           Anesthesia Physical  Anesthesia Plan  ASA: 3  Anesthesia Plan: General   Post-op Pain Management:    Induction: Intravenous  PONV Risk Score and Plan: 2 and Propofol infusion and TIVA  Airway Management Planned: Natural Airway and Nasal Cannula  Additional Equipment:   Intra-op Plan:   Post-operative Plan:   Informed Consent: I have reviewed the patients  History and Physical, chart, labs and discussed the procedure including the risks, benefits and alternatives for the proposed anesthesia with the patient or authorized representative who has indicated his/her understanding and acceptance.     Dental advisory given  Plan Discussed with: CRNA, Anesthesiologist and Surgeon  Anesthesia Plan Comments:        Anesthesia Quick Evaluation

## 2021-10-30 NOTE — H&P (Signed)
Outpatient short stay form Pre-procedure 10/30/2021  Lesly Rubenstein, MD  Primary Physician: Rutherford Limerick, PA  Reason for visit:  History of ulcer and poor prep on colon  History of present illness:   65 y/o lady with history of hypertension here for EGD/Colonoscopy for history of ulcer and poor prep on colonoscopy. Had 13 tubular adenoma's on colonoscopy done in 2017. History of hysterectomy. No blood thinners. No GI malignancies.    Current Facility-Administered Medications:    0.9 %  sodium chloride infusion, , Intravenous, Continuous, Emberlin Verner, Hilton Cork, MD, Last Rate: 20 mL/hr at 10/30/21 0719, New Bag at 10/30/21 0719  Medications Prior to Admission  Medication Sig Dispense Refill Last Dose   aspirin 81 MG tablet Take 81 mg by mouth daily. With dinner   Past Week   Aspirin-Acetaminophen 500-325 MG PACK Take 1 packet by mouth as needed.   Past Week   citalopram (CELEXA) 20 MG tablet Take 20 mg by mouth daily. With dinner   Past Week   gabapentin (NEURONTIN) 800 MG tablet Take 800 mg by mouth 2 (two) times daily.    Past Week   HYDROcodone-acetaminophen (NORCO/VICODIN) 5-325 MG tablet Take 1 tablet by mouth every 6 (six) hours as needed for moderate pain.   Past Week   ibuprofen (ADVIL,MOTRIN) 200 MG tablet Take 400 mg by mouth as needed.   Past Week   insulin aspart (NOVOLOG) 100 UNIT/ML injection Inject 8 Units into the skin 2 (two) times daily.    Past Week   insulin detemir (LEVEMIR) 100 UNIT/ML injection Inject 30 Units into the skin at bedtime.    Past Week   lisinopril (PRINIVIL,ZESTRIL) 2.5 MG tablet Take 2.5 mg by mouth daily. With dinner   Past Week   metFORMIN (GLUCOPHAGE) 500 MG tablet Take 1,000 mg by mouth 2 (two) times daily with a meal.   Past Week   Multiple Vitamins-Minerals (CENTRUM SILVER PO) Take 1 tablet by mouth daily.   Past Week   pantoprazole (PROTONIX) 40 MG tablet Take 40 mg by mouth daily.   Past Week   Potassium 99 MG TABS Take 1 tablet by mouth  daily. With breakfast   Past Week   pravastatin (PRAVACHOL) 10 MG tablet Take 10 mg by mouth daily. With dinner   Past Week   sitaGLIPtin (JANUVIA) 100 MG tablet Take 100 mg by mouth daily.   Past Week   acetaminophen (TYLENOL) 650 MG CR tablet Take 1,300 mg by mouth 2 (two) times daily at 10 AM and 5 PM.        No Known Allergies   Past Medical History:  Diagnosis Date   Anxiety    Arthritis    hand   Depression    Diabetes mellitus without complication ([place])    Dyspnea    DOE   Hyperlipemia    Hypertension    Neuropathy     Review of systems:  Otherwise negative.    Physical Exam  Gen: Alert, oriented. Appears stated age.  HEENT: PERRLA. Lungs: No respiratory distress CV: RRR Abd: soft, benign, no masses Ext: No edema    Planned procedures: Proceed with EGD/colonoscopy. The patient understands the nature of the planned procedure, indications, risks, alternatives and potential complications including but not limited to bleeding, infection, perforation, damage to internal organs and possible oversedation/side effects from anesthesia. The patient agrees and gives consent to proceed.  Please refer to procedure notes for findings, recommendations and patient disposition/instructions.  Lesly Rubenstein, MD [facility]

## 2021-10-30 NOTE — Anesthesia Postprocedure Evaluation (Signed)
Anesthesia Post Note  Patient: Emma White  Procedure(s) Performed: ESOPHAGOGASTRODUODENOSCOPY (EGD) WITH PROPOFOL COLONOSCOPY WITH PROPOFOL  Patient location during evaluation: Phase II Anesthesia Type: General Level of consciousness: awake and alert, awake and oriented Pain management: pain level controlled Vital Signs Assessment: post-procedure vital signs reviewed and stable Respiratory status: spontaneous breathing, nonlabored ventilation and respiratory function stable Cardiovascular status: blood pressure returned to baseline and stable Postop Assessment: no apparent nausea or vomiting Anesthetic complications: no   No notable events documented.   Last Vitals:  Vitals:   10/30/21 0840 10/30/21 0850  BP: 122/82 (!) 146/50  Pulse: 66 65  Resp: 15 20  Temp:    SpO2: 95% 99%    Last Pain:  Vitals:   10/30/21 0820  TempSrc: Temporal  PainSc:                  Phill Mutter

## 2021-10-30 NOTE — Interval H&P Note (Signed)
History and Physical Interval Note:  10/30/2021 7:46 AM  Emma White  has presented today for surgery, with the diagnosis of PH Adenomatous Polyps Gastric Ulcer.  The various methods of treatment have been discussed with the patient and family. After consideration of risks, benefits and other options for treatment, the patient has consented to  Procedure(s) with comments: ESOPHAGOGASTRODUODENOSCOPY (EGD) WITH PROPOFOL (N/A) - IDDM COLONOSCOPY WITH PROPOFOL (N/A) as a surgical intervention.  The patient's history has been reviewed, patient examined, no change in status, stable for surgery.  I have reviewed the patient's chart and labs.  Questions were answered to the patient's satisfaction.     Lesly Rubenstein  Ok to proceed with EGD/Colonoscopy

## 2021-10-31 ENCOUNTER — Encounter: Payer: Self-pay | Admitting: Gastroenterology

## 2021-11-01 LAB — SURGICAL PATHOLOGY

## 2021-12-03 DIAGNOSIS — K635 Polyp of colon: Secondary | ICD-10-CM | POA: Insufficient documentation

## 2022-02-04 ENCOUNTER — Encounter: Payer: Self-pay | Admitting: *Deleted

## 2022-02-05 ENCOUNTER — Ambulatory Visit: Payer: Medicare HMO | Admitting: Certified Registered Nurse Anesthetist

## 2022-02-05 ENCOUNTER — Encounter: Payer: Self-pay | Admitting: *Deleted

## 2022-02-05 ENCOUNTER — Encounter
Admission: RE | Disposition: A | Discharge status: Home / Self Care | Payer: Self-pay | Source: Home / Self Care | Attending: Gastroenterology

## 2022-02-05 ENCOUNTER — Ambulatory Visit
Admission: RE | Admit: 2022-02-05 | Discharge: 2022-02-05 | Disposition: A | Discharge status: Home / Self Care | Payer: Medicare HMO | Attending: Gastroenterology | Admitting: Gastroenterology

## 2022-02-05 ENCOUNTER — Other Ambulatory Visit: Payer: Self-pay

## 2022-02-05 DIAGNOSIS — Z7984 Long term (current) use of oral hypoglycemic drugs: Secondary | ICD-10-CM | POA: Diagnosis not present

## 2022-02-05 DIAGNOSIS — D123 Benign neoplasm of transverse colon: Secondary | ICD-10-CM | POA: Diagnosis not present

## 2022-02-05 DIAGNOSIS — D120 Benign neoplasm of cecum: Secondary | ICD-10-CM | POA: Insufficient documentation

## 2022-02-05 DIAGNOSIS — K219 Gastro-esophageal reflux disease without esophagitis: Secondary | ICD-10-CM | POA: Insufficient documentation

## 2022-02-05 DIAGNOSIS — D122 Benign neoplasm of ascending colon: Secondary | ICD-10-CM | POA: Insufficient documentation

## 2022-02-05 DIAGNOSIS — M19049 Primary osteoarthritis, unspecified hand: Secondary | ICD-10-CM | POA: Insufficient documentation

## 2022-02-05 DIAGNOSIS — Z794 Long term (current) use of insulin: Secondary | ICD-10-CM | POA: Insufficient documentation

## 2022-02-05 DIAGNOSIS — D126 Benign neoplasm of colon, unspecified: Secondary | ICD-10-CM | POA: Insufficient documentation

## 2022-02-05 DIAGNOSIS — Z79899 Other long term (current) drug therapy: Secondary | ICD-10-CM | POA: Insufficient documentation

## 2022-02-05 DIAGNOSIS — I10 Essential (primary) hypertension: Secondary | ICD-10-CM | POA: Insufficient documentation

## 2022-02-05 DIAGNOSIS — F32A Depression, unspecified: Secondary | ICD-10-CM | POA: Insufficient documentation

## 2022-02-05 DIAGNOSIS — F419 Anxiety disorder, unspecified: Secondary | ICD-10-CM | POA: Diagnosis not present

## 2022-02-05 DIAGNOSIS — K5730 Diverticulosis of large intestine without perforation or abscess without bleeding: Secondary | ICD-10-CM | POA: Insufficient documentation

## 2022-02-05 DIAGNOSIS — E785 Hyperlipidemia, unspecified: Secondary | ICD-10-CM | POA: Diagnosis not present

## 2022-02-05 DIAGNOSIS — E1140 Type 2 diabetes mellitus with diabetic neuropathy, unspecified: Secondary | ICD-10-CM | POA: Insufficient documentation

## 2022-02-05 DIAGNOSIS — K6389 Other specified diseases of intestine: Secondary | ICD-10-CM | POA: Insufficient documentation

## 2022-02-05 HISTORY — PX: COLONOSCOPY WITH PROPOFOL: SHX5780

## 2022-02-05 LAB — GLUCOSE, CAPILLARY: Glucose-Capillary: 248 mg/dL — ABNORMAL HIGH (ref 70–99)

## 2022-02-05 SURGERY — COLONOSCOPY WITH PROPOFOL
Anesthesia: General

## 2022-02-05 MED ORDER — SODIUM CHLORIDE 0.9 % IV SOLN
INTRAVENOUS | Status: AC | PRN
Start: 2022-02-05 — End: 2022-02-05
  Administered 2022-02-05: 3 mL via INTRAMUSCULAR

## 2022-02-05 MED ORDER — PROPOFOL 500 MG/50ML IV EMUL
INTRAVENOUS | Status: DC | PRN
  Administered 2022-02-05: 125 ug/kg/min via INTRAVENOUS

## 2022-02-05 MED ORDER — PHENYLEPHRINE 40 MCG/ML (10ML) SYRINGE FOR IV PUSH (FOR BLOOD PRESSURE SUPPORT)
PREFILLED_SYRINGE | INTRAVENOUS | Status: DC | PRN
  Administered 2022-02-05 (×2): 80 ug via INTRAVENOUS

## 2022-02-05 MED ORDER — LIDOCAINE HCL (CARDIAC) PF 100 MG/5ML IV SOSY
PREFILLED_SYRINGE | INTRAVENOUS | Status: DC | PRN
  Administered 2022-02-05: 50 mg via INTRAVENOUS

## 2022-02-05 MED ORDER — PROPOFOL 10 MG/ML IV BOLUS
INTRAVENOUS | Status: DC | PRN
  Administered 2022-02-05: 50 mg via INTRAVENOUS

## 2022-02-05 MED ORDER — SODIUM CHLORIDE 0.9 % IV SOLN: INTRAVENOUS | Status: DC

## 2022-02-05 NOTE — Op Note (Signed)
[facility] Patient Name: Emma White Procedure Date: 02/05/2022 8:16 AM MRN: [id number] Account #: [id number] Date of Birth: 04-29-1956 Admit Type: Outpatient Age: 66 Room: [facility] ENDO ROOM 1 Gender: Female Note Status: Finalized Instrument Name: Jasper Riling [id number] Procedure:             Colonoscopy Indications:           Adenomatous polyps in the colon Providers:             Andrey Farmer MD, MD Referring MD:          No Local Md, MD (Referring MD) Medicines:             Monitored Anesthesia Care Complications:         No immediate complications. Estimated blood loss:                         Minimal. Procedure:             Pre-Anesthesia Assessment:                        - Prior to the procedure, a History and Physical was                         performed, and patient medications and allergies were                         reviewed. The patient is competent. The risks and                         benefits of the procedure and the sedation options and                         risks were discussed with the patient. All questions                         were answered and informed consent was obtained.                         Patient identification and proposed procedure were                         verified by the physician, the nurse, the                         anesthesiologist, the anesthetist and the technician                         in the endoscopy suite. Mental Status Examination:                         alert and oriented. Airway Examination: normal                         oropharyngeal airway and neck mobility. Respiratory                         Examination: clear to auscultation. CV Examination:  normal. Prophylactic Antibiotics: The patient does not                         require prophylactic antibiotics. Prior                         Anticoagulants: The patient has taken no previous                          anticoagulant or antiplatelet agents. ASA Grade                         Assessment: II - A patient with mild systemic disease.                         After reviewing the risks and benefits, the patient                         was deemed in satisfactory condition to undergo the                         procedure. The anesthesia plan was to use monitored                         anesthesia care (MAC). Immediately prior to                         administration of medications, the patient was                         re-assessed for adequacy to receive sedatives. The                         heart rate, respiratory rate, oxygen saturations,                         blood pressure, adequacy of pulmonary ventilation, and                         response to care were monitored throughout the                         procedure. The physical status of the patient was                         re-assessed after the procedure.                        After obtaining informed consent, the colonoscope was                         passed under direct vision. Throughout the procedure,                         the patient's blood pressure, pulse, and oxygen                         saturations were monitored continuously. The  Colonoscope was introduced through the anus and                         advanced to the the cecum, identified by appendiceal                         orifice and ileocecal valve. The colonoscopy was                         performed without difficulty. The patient tolerated                         the procedure well. The quality of the bowel                         preparation was adequate to identify polyps. Findings:      The perianal and digital rectal examinations were normal.      A localized area of granular mucosa was found at the appendiceal       orifice. The polyp was removed with a cold snare. Polyp resection was       incomplete, and the resected tissue was  partially retrieved. The polyp       was removed with a jumbo cold forceps. Resection was complete, and       retrieval was complete. Coagulation for tissue destruction using snare       was successful. Estimated blood loss was minimal. There appeared to be a       small amount that extended into the appendiceal orifice.      A 5 mm polyp was found in the ascending colon. The polyp was flat. The       polyp was removed with a cold snare. Resection and retrieval were       complete. Estimated blood loss was minimal.      A 4 mm polyp was found in the proximal transverse colon. The polyp was       sessile. The polyp was removed with a cold snare. Resection and       retrieval were complete. Estimated blood loss was minimal.      Multiple small-mouthed diverticula were found in the sigmoid colon.      The exam was otherwise without abnormality on direct and retroflexion       views. Impression:            - Granularity at the appendiceal orifice consistent                         with previously identified adenomatous polyp. Treated                         with a cold snare, jumbo forceps, and soft tip hot                         snare.                        - One 5 mm polyp in the ascending colon, removed with                         a cold snare. Resected and retrieved.                        -  One 4 mm polyp in the proximal transverse colon,                         removed with a cold snare. Resected and retrieved.                        - Diverticulosis in the sigmoid colon.                        - The examination was otherwise normal on direct and                         retroflexion views. Recommendation:        - Discharge patient to home.                        - Resume previous diet.                        - Continue present medications.                        - Await pathology results.                        - Repeat colonoscopy in 1 year for surveillance.                         - Return to referring physician as previously                         scheduled. Will need to consider appendectomy. Procedure Code(s):     --- Professional ---                        [phone], Colonoscopy, flexible; with ablation of                         tumor(s), polyp(s), or other lesion(s) (includes pre-                         and post-dilation and guide wire passage, when                         performed)                        [id number], 59, Colonoscopy, flexible; with removal of                         tumor(s), polyp(s), or other lesion(s) by snare                         technique Diagnosis Code(s):     --- Professional ---                        K44.01, Other specified diseases of intestine                        K63.5, Polyp of colon  D12.6, Benign neoplasm of colon, unspecified                        K57.30, Diverticulosis of large intestine without                         perforation or abscess without bleeding CPT copyright 2019 [employer] Association. All rights reserved. The codes documented in this report are preliminary and upon coder review may  be revised to meet current compliance requirements. Andrey Farmer MD, MD 02/05/2022 9:12:30 AM Number of Addenda: 0 Note Initiated On: 02/05/2022 8:16 AM Estimated Blood Loss:  Estimated blood loss was minimal.      [facility]

## 2022-02-05 NOTE — Anesthesia Postprocedure Evaluation (Signed)
Anesthesia Post Note  Patient: Emma White  Procedure(s) Performed: COLONOSCOPY WITH PROPOFOL  Patient location during evaluation: Endoscopy Anesthesia Type: General Level of consciousness: awake and alert Pain management: pain level controlled Vital Signs Assessment: post-procedure vital signs reviewed and stable Respiratory status: spontaneous breathing, nonlabored ventilation, respiratory function stable and patient connected to nasal cannula oxygen Cardiovascular status: blood pressure returned to baseline and stable Postop Assessment: no apparent nausea or vomiting Anesthetic complications: no   No notable events documented.   Last Vitals:  Vitals:   02/05/22 0907 02/05/22 0917  BP: (!) 132/54 (!) 155/63  Pulse: 69   Resp: (!) 8   Temp: (!) 36.3 C   SpO2: 97%     Last Pain:  Vitals:   02/05/22 0917  TempSrc:   PainSc: 0-No pain                 Arita Miss

## 2022-02-05 NOTE — Interval H&P Note (Signed)
History and Physical Interval Note:  02/05/2022 8:20 AM  Emma White  has presented today for surgery, with the diagnosis of TA polyp..  The various methods of treatment have been discussed with the patient and family. After consideration of risks, benefits and other options for treatment, the patient has consented to  Procedure(s): COLONOSCOPY WITH PROPOFOL (N/A) as a surgical intervention.  The patient's history has been reviewed, patient examined, no change in status, stable for surgery.  I have reviewed the patient's chart and labs.  Questions were answered to the patient's satisfaction.     Lesly Rubenstein  Ok to proceed with colonoscopy

## 2022-02-05 NOTE — Transfer of Care (Signed)
Immediate Anesthesia Transfer of Care Note  Patient: Emma White  Procedure(s) Performed: COLONOSCOPY WITH PROPOFOL  Patient Location: PACU  Anesthesia Type:General  Level of Consciousness: awake, alert  and oriented  Airway & Oxygen Therapy: Patient Spontanous Breathing and Patient connected to nasal cannula oxygen  Post-op Assessment: Report given to RN and Post -op Vital signs reviewed and stable  Post vital signs: Reviewed and stable  Last Vitals:  Vitals Value Taken Time  BP    Temp    Pulse    Resp    SpO2      Last Pain:  Vitals:   02/05/22 0801  TempSrc: Temporal  PainSc: 0-No pain         Complications: No notable events documented.

## 2022-02-05 NOTE — Anesthesia Preprocedure Evaluation (Signed)
Anesthesia Evaluation  Patient identified by MRN, date of birth, ID band Patient awake    Reviewed: Allergy & Precautions, NPO status , Patient's Chart, lab work & pertinent test results  History of Anesthesia Complications Negative for: history of anesthetic complications  Airway Mallampati: II  TM Distance: >3 FB Neck ROM: Full    Dental  (+) Poor Dentition, Chipped, Missing,    Pulmonary shortness of breath and with exertion, neg sleep apnea, neg COPD,    breath sounds clear to auscultation- rhonchi (-) wheezing      Cardiovascular Exercise Tolerance: Good hypertension, Pt. on medications (-) CAD, (-) Past MI, (-) Cardiac Stents and (-) CABG  Rhythm:Regular Rate:Normal - Systolic murmurs and - Diastolic murmurs    Neuro/Psych neg Seizures PSYCHIATRIC DISORDERS Anxiety Depression negative neurological ROS     GI/Hepatic Neg liver ROS, Bowel prep,GERD  Medicated and Controlled,  Endo/Other  diabetes, Poorly Controlled, Insulin Dependent  Renal/GU negative Renal ROS     Musculoskeletal  (+) Arthritis , Osteoarthritis,    Abdominal (+) - obese,   Peds  Hematology negative hematology ROS (+)   Anesthesia Other Findings Past Medical History: No date: Anxiety No date: Arthritis     Comment:  hand No date: Depression No date: Diabetes mellitus without complication (HCC) No date: Dyspnea     Comment:  DOE No date: Hyperlipemia No date: Hypertension No date: Neuropathy   Reproductive/Obstetrics                             Anesthesia Physical  Anesthesia Plan  ASA: 3  Anesthesia Plan: General   Post-op Pain Management: Minimal or no pain anticipated   Induction: Intravenous  PONV Risk Score and Plan: 3 and Propofol infusion and TIVA  Airway Management Planned: Natural Airway and Nasal Cannula  Additional Equipment: None  Intra-op Plan:   Post-operative Plan:   Informed  Consent: I have reviewed the patients History and Physical, chart, labs and discussed the procedure including the risks, benefits and alternatives for the proposed anesthesia with the patient or authorized representative who has indicated his/her understanding and acceptance.     Dental advisory given  Plan Discussed with: CRNA, Anesthesiologist and Surgeon  Anesthesia Plan Comments: (Discussed risks of anesthesia with patient, including possibility of difficulty with spontaneous ventilation under anesthesia necessitating airway intervention, PONV, and rare risks such as cardiac or respiratory or neurological events, and allergic reactions. Discussed the role of CRNA in patient's perioperative care. Patient understands.)        Anesthesia Quick Evaluation

## 2022-02-05 NOTE — H&P (Signed)
Outpatient short stay form Pre-procedure 02/05/2022  Lesly Rubenstein, MD  Primary Physician: Rutherford Limerick, PA  Reason for visit:  Polyp of colon  History of present illness:    66 y/o lady with history of colonoscopy about 3-4 months ago with area of granular mucosa at the AO with biopsies showing TA. She is here for removal of the polyp. No blood thinners. No abdominal surgeries. No family history of GI malignancies.    Current Facility-Administered Medications:    0.9 %  sodium chloride infusion, , Intravenous, Continuous, Kaiah Hosea, Hilton Cork, MD, Last Rate: 20 mL/hr at 02/05/22 0815, Continued from Pre-op at 02/05/22 0815  Medications Prior to Admission  Medication Sig Dispense Refill Last Dose   aspirin 81 MG tablet Take 81 mg by mouth daily. With dinner   Past Week   citalopram (CELEXA) 20 MG tablet Take 20 mg by mouth daily. With dinner   Past Week   gabapentin (NEURONTIN) 800 MG tablet Take 800 mg by mouth 2 (two) times daily.    Past Week   metFORMIN (GLUCOPHAGE) 500 MG tablet Take 1,000 mg by mouth 2 (two) times daily with a meal.   02/04/2022   acetaminophen (TYLENOL) 650 MG CR tablet Take 1,300 mg by mouth 2 (two) times daily at 10 AM and 5 PM.      Aspirin-Acetaminophen 500-325 MG PACK Take 1 packet by mouth as needed.      HYDROcodone-acetaminophen (NORCO/VICODIN) 5-325 MG tablet Take 1 tablet by mouth every 6 (six) hours as needed for moderate pain.      ibuprofen (ADVIL,MOTRIN) 200 MG tablet Take 400 mg by mouth as needed.      insulin aspart (NOVOLOG) 100 UNIT/ML injection Inject 8 Units into the skin 2 (two) times daily.    02/03/2022   insulin detemir (LEVEMIR) 100 UNIT/ML injection Inject 30 Units into the skin at bedtime.    02/03/2022   lisinopril (PRINIVIL,ZESTRIL) 2.5 MG tablet Take 2.5 mg by mouth daily. With dinner      Multiple Vitamins-Minerals (CENTRUM SILVER PO) Take 1 tablet by mouth daily.      pantoprazole (PROTONIX) 40 MG tablet Take 40 mg by mouth  daily.      Potassium 99 MG TABS Take 1 tablet by mouth daily. With breakfast      pravastatin (PRAVACHOL) 10 MG tablet Take 10 mg by mouth daily. With dinner      sitaGLIPtin (JANUVIA) 100 MG tablet Take 100 mg by mouth daily.        No Known Allergies   Past Medical History:  Diagnosis Date   Anxiety    Arthritis    hand   Depression    Diabetes mellitus without complication ([place])    Dyspnea    DOE   Hyperlipemia    Hypertension    Neuropathy     Review of systems:  Otherwise negative.    Physical Exam  Gen: Alert, oriented. Appears stated age.  HEENT: PERRLA. Lungs: No respiratory distress CV: RRR Abd: soft, benign, no masses Ext: No edema    Planned procedures: Proceed with colonoscopy. The patient understands the nature of the planned procedure, indications, risks, alternatives and potential complications including but not limited to bleeding, infection, perforation, damage to internal organs and possible oversedation/side effects from anesthesia. The patient agrees and gives consent to proceed.  Please refer to procedure notes for findings, recommendations and patient disposition/instructions.     Lesly Rubenstein, MD [facility]

## 2022-02-06 ENCOUNTER — Encounter: Payer: Self-pay | Admitting: Gastroenterology

## 2022-02-06 LAB — SURGICAL PATHOLOGY

## 2022-03-05 ENCOUNTER — Ambulatory Visit: Payer: Self-pay | Admitting: General Surgery

## 2022-03-05 NOTE — H&P (View-Only) (Signed)
PATIENT PROFILE: ?Emma White is a 66 y.o. female who presents to the Clinic for consultation at the request of Dr. Haig Prophet for evaluation of appendiceal mass. ?  ?PCP:  August Luz ?  ?HISTORY OF PRESENT ILLNESS: ?[name] reports she had a colonoscopy that she was found with a polyp that extended into the appendix.  She has had multiple colonoscopy procedures to try to remove the whole polyp but the polyp seems to be getting into the appendix.  Biopsy reports tubular adenoma, no atypia.  Patient denies any rectal bleeding.  Patient denies any weight loss.  Patient denies any abdominal pain.  Patient is tolerating diet.  No pain radiation.  No alleviating or aggravating factors.  Patient denies previous history of malignancy. ?  ?I have discussed case with Dr. Haig Prophet.  He had had multiple attempts endoscopically to remove this polyp but has not been able to completely remove the tubular adenoma.  I also evaluated the images of the colonoscopy. ?  ?  ?PROBLEM LIST: ?       ?Problem List  Date Reviewed: 05/13/2017  ?        Noted  ?  Hyperlipidemia associated with type 2 diabetes mellitus , unspecified (CMS-HCC) Unknown  ?  Depression Unknown  ?  Type 2 diabetes mellitus with diabetic polyneuropathy, with long-term current use of insulin (CMS-HCC) Unknown  ?  ?  ?GENERAL REVIEW OF SYSTEMS:  ?  ?General ROS: negative for - chills, fatigue, fever, weight gain or weight loss ?Allergy and Immunology ROS: negative for - hives  ?Hematological and Lymphatic ROS: negative for - bleeding problems or bruising, negative for palpable nodes ?Endocrine ROS: negative for - heat or cold intolerance, hair changes ?Respiratory ROS: negative for - cough, shortness of breath or wheezing ?Cardiovascular ROS: no chest pain or palpitations ?GI ROS: negative for nausea, vomiting, abdominal pain, diarrhea, constipation ?Musculoskeletal ROS: negative for - joint swelling or muscle pain ?Neurological ROS: negative for - confusion,  syncope ?Dermatological ROS: negative for pruritus and rash ?Psychiatric: negative for anxiety, depression, difficulty sleeping and memory loss ?  ?MEDICATIONS: ?Current Medications  ?      ?Current Outpatient Medications  ?Medication Sig Dispense Refill  ? acetaminophen (TYLENOL) 650 MG ER tablet Take by mouth as needed      ? amLODIPine (NORVASC) 5 MG tablet once daily      ? aspirin 81 MG chewable tablet Take 81 mg by mouth once daily.      ? citalopram (CELEXA) 20 MG tablet Take 20 mg by mouth once daily.      ? gabapentin (NEURONTIN) 800 MG tablet Take 1,600 mg by mouth 2 (two) times daily.  ?       ? ibuprofen (MOTRIN) 200 MG tablet Take by mouth as needed      ? LEVEMIR FLEXTOUCH U-100 INSULN pen injector (concentration 100 units/mL) Inject 30 Units subcutaneously nightly. (Patient taking differently: Inject 30-32 Units subcutaneously at bedtime) 15 mL 4  ? metFORMIN (GLUCOPHAGE) 500 MG tablet Take 1,000 mg by mouth once daily      ? NOVOLOG FLEXPEN U-100 INSULIN pen injector (concentration 100 units/mL) INJECT 8 UNITS SUBCUTANEOUSLY THREE TIMES DAILY WITH MEALS 45 mL 1  ? pantoprazole (PROTONIX) 40 MG DR tablet Take 1 tablet (40 mg total) by mouth once daily Take 30 min before meals 30 tablet 2  ? peg-electrolyte (NULYTELY) solution Use as directed for colon cleanse 4000 mL 0  ? potassium 99 mg Tab Take  1 tablet by mouth once daily.      ? potassium gluconate 2.5 mEq Tab Take 1 tablet by mouth daily with breakfast      ? pravastatin (PRAVACHOL) 10 MG tablet Take 10 mg by mouth nightly.      ? SITagliptin phosphate (JANUVIA) 100 MG tablet Take 100 mg by mouth once daily      ? sodium, potassium, and magnesium (SUPREP) oral solution Take 1 Bottle by mouth as directed One kit contains 2 bottles.  Take both bottles at the times instructed by your provider. 354 mL 0  ? FOLIC ACID/MULTIVIT-MIN/LUTEIN (CENTRUM SILVER ORAL) Take by mouth. (Patient not taking: Reported on 04/11/2021)      ? lisinopril  (PRINIVIL,ZESTRIL) 2.5 MG tablet Take 1 tablet (2.5 mg total) by mouth once daily. (Patient not taking: Reported on 03/05/2022) 30 tablet 6  ?  ?No current facility-administered medications for this visit.  ?  ?  ?  ?ALLERGIES: ?Patient has no known allergies. ?  ?PAST MEDICAL HISTORY: ?    ?Past Medical History:  ?Diagnosis Date  ? Depression    ? Diabetes (CMS-HCC)    ? Hyperlipemia    ? Hypertension    ?  ?  ?PAST SURGICAL HISTORY: ?     ?Past Surgical History:  ?Procedure Laterality Date  ? KNEE ARTHROCENTESIS Left 1981  ? KNEE ARTHROCENTESIS Right 1992  ? HYSTERECTOMY SUPRACERVICAL ABDOMINAL W/REMOVAL TUBES &/OR OVARIES   1995  ? COLONOSCOPY   05/27/2016  ?  Adenomatous Polyps, FH Colon Polyps (Brother): CBF 05/2017; Recall Ltr mailed 04/23/2017 (dw)  ? COLONOSCOPY   07/10/2021  ?  Tubular adenoma/Poor colon prep/Repeat 40months/CTL  ? EGD   07/10/2021  ?  GERD/Gastritis/Repeat 3 months/CTL  ? COLONOSCOPY   10/30/2021  ?  Tubular adenoma/Repeat 3-6months/CTL  ? EGD   10/30/2021  ?  Intestinal metaplasia/Gastritis/Repeat 69yrs/CTL  ? COLONOSCOPY   02/05/2022  ?  Tubular adenomas/PHx CP/Repeat 1y/CTL  ?  ?  ?FAMILY HISTORY: ?     ?Family History  ?Problem Relation Age of Onset  ? Diabetes Brother    ? Prostate cancer Brother    ? Colon polyps Brother    ? Diabetes Maternal Aunt    ? Stroke Mother    ? Cirrhosis Father    ? Diabetes Brother    ? No Known Problems Daughter    ? No Known Problems Maternal Grandmother    ? No Known Problems Maternal Grandfather    ? No Known Problems Paternal Grandmother    ? No Known Problems Paternal Grandfather    ? Colon cancer Neg Hx    ? Inflammatory bowel disease Neg Hx    ?  ?  ?SOCIAL HISTORY: ?Social History  ?  ?  ?    ?Socioeconomic History  ? Marital status: Married  ?Tobacco Use  ? Smoking status: Never  ? Smokeless tobacco: Never  ?Vaping Use  ? Vaping Use: Never used  ?Substance and Sexual Activity  ? Alcohol use: No  ? Sexual activity: Never  ?  ?  ?  ?PHYSICAL EXAM: ?    ?Vitals:  ?  03/05/22 1119  ?BP: (!) 146/70  ?Pulse: 85  ?  ?Body mass index is 29.49 kg/m?. ?Weight: 68.5 kg (151 lb)  ?  ?GENERAL: Alert, active, oriented x3 ?  ?HEENT: Pupils equal reactive to light. Extraocular movements are intact. Sclera clear. Palpebral conjunctiva normal red color.Pharynx clear. ?  ?NECK: Supple with no palpable mass and  no adenopathy. ?  ?LUNGS: Sound clear with no rales rhonchi or wheezes. ?  ?HEART: Regular rhythm S1 and S2 without murmur. ?  ?ABDOMEN: Soft and depressible, nontender with no palpable mass, no hepatomegaly.  ?  ?EXTREMITIES: Well-developed well-nourished symmetrical with no dependent edema. ?  ?NEUROLOGICAL: Awake alert oriented, facial expression symmetrical, moving all extremities. ?  ?REVIEW OF DATA: ?I have reviewed the following data today: ?     ?No visits with results within 3 Month(s) from this visit.  ?Latest known visit with results is:  ?Initial consult on 04/11/2021  ?Component Date Value  ? WBC (White Blood Cell Co* 04/11/2021 5.7   ? RBC (Red Blood Cell Coun* 04/11/2021 4.37   ? Hemoglobin 04/11/2021 12.6   ? Hematocrit 04/11/2021 38.7   ? MCV (Mean Corpuscular Vo* 04/11/2021 88.6   ? MCH (Mean Corpuscular He* 04/11/2021 28.8   ? MCHC (Mean Corpuscular H* 04/11/2021 32.6   ? Platelet Count 04/11/2021 226   ? RDW-CV (Red Cell Distrib* 04/11/2021 14.6   ? MPV (Mean Platelet Volum* 04/11/2021 10.2   ? Neutrophils 04/11/2021 3.67   ? Lymphocytes 04/11/2021 1.41   ? Monocytes 04/11/2021 0.41   ? Eosinophils 04/11/2021 0.09   ? Basophils 04/11/2021 0.03   ? Neutrophil % 04/11/2021 64.9   ? Lymphocyte % 04/11/2021 24.9   ? Monocyte % 04/11/2021 7.2   ? Eosinophil % 04/11/2021 1.6   ? Basophil% 04/11/2021 0.5   ? Immature Granulocyte % 04/11/2021 0.9 (H)   ? Immature Granulocyte Cou* 04/11/2021 0.05   ? Glucose 04/11/2021 228 (H)   ? Sodium 04/11/2021 140   ? Potassium 04/11/2021 4.8   ? Chloride 04/11/2021 104   ? Carbon Dioxide (CO2) 04/11/2021 28.9   ? Urea  Nitrogen (BUN) 04/11/2021 16   ? Creatinine 04/11/2021 0.7   ? Glomerular Filtration Ra* 04/11/2021 84   ? Calcium 04/11/2021 9.5   ? AST  04/11/2021 19   ? ALT  04/11/2021 17   ? Alk Phos (alkaline Phosp* 04/11/2021 64   ? Al

## 2022-03-05 NOTE — H&P (Signed)
PATIENT PROFILE: [name] is a 66 y.o. female who presents to the Clinic for consultation at the request of Dr. Haig Prophet for evaluation of appendiceal mass. ?  ?PCP:  August Luz ?  ?HISTORY OF PRESENT ILLNESS: ?Ms. Waldridge reports she had a colonoscopy that she was found with a polyp that extended into the appendix.  She has had multiple colonoscopy procedures to try to remove the whole polyp but the polyp seems to be getting into the appendix.  Biopsy reports tubular adenoma, no atypia.  Patient denies any rectal bleeding.  Patient denies any weight loss.  Patient denies any abdominal pain.  Patient is tolerating diet.  No pain radiation.  No alleviating or aggravating factors.  Patient denies previous history of malignancy. ?  ?I have discussed case with Dr. Haig Prophet.  He had had multiple attempts endoscopically to remove this polyp but has not been able to completely remove the tubular adenoma.  I also evaluated the images of the colonoscopy. ?  ?  ?PROBLEM LIST: ?       ?Problem List  Date Reviewed: 05/13/2017  ?        Noted  ?  Hyperlipidemia associated with type 2 diabetes mellitus , unspecified (CMS-HCC) Unknown  ?  Depression Unknown  ?  Type 2 diabetes mellitus with diabetic polyneuropathy, with long-term current use of insulin (CMS-HCC) Unknown  ?  ?  ?GENERAL REVIEW OF SYSTEMS:  ?  ?General ROS: negative for - chills, fatigue, fever, weight gain or weight loss ?Allergy and Immunology ROS: negative for - hives  ?Hematological and Lymphatic ROS: negative for - bleeding problems or bruising, negative for palpable nodes ?Endocrine ROS: negative for - heat or cold intolerance, hair changes ?Respiratory ROS: negative for - cough, shortness of breath or wheezing ?Cardiovascular ROS: no chest pain or palpitations ?GI ROS: negative for nausea, vomiting, abdominal pain, diarrhea, constipation ?Musculoskeletal ROS: negative for - joint swelling or muscle pain ?Neurological ROS: negative for - confusion,  syncope ?Dermatological ROS: negative for pruritus and rash ?Psychiatric: negative for anxiety, depression, difficulty sleeping and memory loss ?  ?MEDICATIONS: ?Current Medications  ?      ?Current Outpatient Medications  ?Medication Sig Dispense Refill  ? acetaminophen (TYLENOL) 650 MG ER tablet Take by mouth as needed      ? amLODIPine (NORVASC) 5 MG tablet once daily      ? aspirin 81 MG chewable tablet Take 81 mg by mouth once daily.      ? citalopram (CELEXA) 20 MG tablet Take 20 mg by mouth once daily.      ? gabapentin (NEURONTIN) 800 MG tablet Take 1,600 mg by mouth 2 (two) times daily.  ?       ? ibuprofen (MOTRIN) 200 MG tablet Take by mouth as needed      ? LEVEMIR FLEXTOUCH U-100 INSULN pen injector (concentration 100 units/mL) Inject 30 Units subcutaneously nightly. (Patient taking differently: Inject 30-32 Units subcutaneously at bedtime) 15 mL 4  ? metFORMIN (GLUCOPHAGE) 500 MG tablet Take 1,000 mg by mouth once daily      ? NOVOLOG FLEXPEN U-100 INSULIN pen injector (concentration 100 units/mL) INJECT 8 UNITS SUBCUTANEOUSLY THREE TIMES DAILY WITH MEALS 45 mL 1  ? pantoprazole (PROTONIX) 40 MG DR tablet Take 1 tablet (40 mg total) by mouth once daily Take 30 min before meals 30 tablet 2  ? peg-electrolyte (NULYTELY) solution Use as directed for colon cleanse 4000 mL 0  ? potassium 99 mg Tab Take  1 tablet by mouth once daily.      ? potassium gluconate 2.5 mEq Tab Take 1 tablet by mouth daily with breakfast      ? pravastatin (PRAVACHOL) 10 MG tablet Take 10 mg by mouth nightly.      ? SITagliptin phosphate (JANUVIA) 100 MG tablet Take 100 mg by mouth once daily      ? sodium, potassium, and magnesium (SUPREP) oral solution Take 1 Bottle by mouth as directed One kit contains 2 bottles.  Take both bottles at the times instructed by your provider. 354 mL 0  ? FOLIC ACID/MULTIVIT-MIN/LUTEIN (CENTRUM SILVER ORAL) Take by mouth. (Patient not taking: Reported on 04/11/2021)      ? lisinopril  (PRINIVIL,ZESTRIL) 2.5 MG tablet Take 1 tablet (2.5 mg total) by mouth once daily. (Patient not taking: Reported on 03/05/2022) 30 tablet 6  ?  ?No current facility-administered medications for this visit.  ?  ?  ?  ?ALLERGIES: ?Patient has no known allergies. ?  ?PAST MEDICAL HISTORY: ?    ?Past Medical History:  ?Diagnosis Date  ? Depression    ? Diabetes (CMS-HCC)    ? Hyperlipemia    ? Hypertension    ?  ?  ?PAST SURGICAL HISTORY: ?     ?Past Surgical History:  ?Procedure Laterality Date  ? KNEE ARTHROCENTESIS Left 1981  ? KNEE ARTHROCENTESIS Right 1992  ? HYSTERECTOMY SUPRACERVICAL ABDOMINAL W/REMOVAL TUBES &/OR OVARIES   1995  ? COLONOSCOPY   05/27/2016  ?  Adenomatous Polyps, FH Colon Polyps (Brother): CBF 05/2017; Recall Ltr mailed 04/23/2017 (dw)  ? COLONOSCOPY   07/10/2021  ?  Tubular adenoma/Poor colon prep/Repeat 61months/CTL  ? EGD   07/10/2021  ?  GERD/Gastritis/Repeat 3 months/CTL  ? COLONOSCOPY   10/30/2021  ?  Tubular adenoma/Repeat 3-75months/CTL  ? EGD   10/30/2021  ?  Intestinal metaplasia/Gastritis/Repeat 49yrs/CTL  ? COLONOSCOPY   02/05/2022  ?  Tubular adenomas/PHx CP/Repeat 1y/CTL  ?  ?  ?FAMILY HISTORY: ?     ?Family History  ?Problem Relation Age of Onset  ? Diabetes Brother    ? Prostate cancer Brother    ? Colon polyps Brother    ? Diabetes Maternal Aunt    ? Stroke Mother    ? Cirrhosis Father    ? Diabetes Brother    ? No Known Problems Daughter    ? No Known Problems Maternal Grandmother    ? No Known Problems Maternal Grandfather    ? No Known Problems Paternal Grandmother    ? No Known Problems Paternal Grandfather    ? Colon cancer Neg Hx    ? Inflammatory bowel disease Neg Hx    ?  ?  ?SOCIAL HISTORY: ?Social History  ?  ?  ?    ?Socioeconomic History  ? Marital status: Married  ?Tobacco Use  ? Smoking status: Never  ? Smokeless tobacco: Never  ?Vaping Use  ? Vaping Use: Never used  ?Substance and Sexual Activity  ? Alcohol use: No  ? Sexual activity: Never  ?  ?  ?  ?PHYSICAL EXAM: ?    ?Vitals:  ?  03/05/22 1119  ?BP: (!) 146/70  ?Pulse: 85  ?  ?Body mass index is 29.49 kg/m?. ?Weight: 68.5 kg (151 lb)  ?  ?GENERAL: Alert, active, oriented x3 ?  ?HEENT: Pupils equal reactive to light. Extraocular movements are intact. Sclera clear. Palpebral conjunctiva normal red color.Pharynx clear. ?  ?NECK: Supple with no palpable mass and  no adenopathy. ?  ?LUNGS: Sound clear with no rales rhonchi or wheezes. ?  ?HEART: Regular rhythm S1 and S2 without murmur. ?  ?ABDOMEN: Soft and depressible, nontender with no palpable mass, no hepatomegaly.  ?  ?EXTREMITIES: Well-developed well-nourished symmetrical with no dependent edema. ?  ?NEUROLOGICAL: Awake alert oriented, facial expression symmetrical, moving all extremities. ?  ?REVIEW OF DATA: ?I have reviewed the following data today: ?     ?No visits with results within 3 Month(s) from this visit.  ?Latest known visit with results is:  ?Initial consult on 04/11/2021  ?Component Date Value  ? WBC (White Blood Cell Co* 04/11/2021 5.7   ? RBC (Red Blood Cell Coun* 04/11/2021 4.37   ? Hemoglobin 04/11/2021 12.6   ? Hematocrit 04/11/2021 38.7   ? MCV (Mean Corpuscular Vo* 04/11/2021 88.6   ? MCH (Mean Corpuscular He* 04/11/2021 28.8   ? MCHC (Mean Corpuscular H* 04/11/2021 32.6   ? Platelet Count 04/11/2021 226   ? RDW-CV (Red Cell Distrib* 04/11/2021 14.6   ? MPV (Mean Platelet Volum* 04/11/2021 10.2   ? Neutrophils 04/11/2021 3.67   ? Lymphocytes 04/11/2021 1.41   ? Monocytes 04/11/2021 0.41   ? Eosinophils 04/11/2021 0.09   ? Basophils 04/11/2021 0.03   ? Neutrophil % 04/11/2021 64.9   ? Lymphocyte % 04/11/2021 24.9   ? Monocyte % 04/11/2021 7.2   ? Eosinophil % 04/11/2021 1.6   ? Basophil% 04/11/2021 0.5   ? Immature Granulocyte % 04/11/2021 0.9 (H)   ? Immature Granulocyte Cou* 04/11/2021 0.05   ? Glucose 04/11/2021 228 (H)   ? Sodium 04/11/2021 140   ? Potassium 04/11/2021 4.8   ? Chloride 04/11/2021 104   ? Carbon Dioxide (CO2) 04/11/2021 28.9   ? Urea  Nitrogen (BUN) 04/11/2021 16   ? Creatinine 04/11/2021 0.7   ? Glomerular Filtration Ra* 04/11/2021 84   ? Calcium 04/11/2021 9.5   ? AST  04/11/2021 19   ? ALT  04/11/2021 17   ? Alk Phos (alkaline Phosp* 04/11/2021 64   ? Al

## 2022-03-12 DIAGNOSIS — Z13820 Encounter for screening for osteoporosis: Secondary | ICD-10-CM | POA: Insufficient documentation

## 2022-03-13 ENCOUNTER — Other Ambulatory Visit
Admission: RE | Admit: 2022-03-13 | Discharge: 2022-03-13 | Disposition: A | Discharge status: Home / Self Care | Payer: Medicare HMO | Source: Ambulatory Visit | Attending: General Surgery | Admitting: General Surgery

## 2022-03-13 NOTE — Patient Instructions (Addendum)
?Your procedure is scheduled on: Monday March 18, 2022. ?Report to Day Surgery inside [facility] 2nd floor, stop by admissions desk before getting on elevator.  ?To find out your arrival time please call [phone] between 1PM - 3PM on Friday March 15, 2022. ? ?Remember: Instructions that are not followed completely may result in serious medical risk,  ?up to and including death, or upon the discretion of your surgeon and anesthesiologist your  ?surgery may need to be rescheduled.  ? ?  _X__ 1. Do not eat food after midnight the night before your procedure. ?                No chewing gum or hard candies.  ? ?__X__2.  On the morning of surgery brush your teeth with toothpaste and water, you ?               may rinse your mouth with mouthwash if you wish.  Do not swallow any toothpaste or mouthwash. ?   ? _X__ 3.  No Alcohol for 24 hours before or after surgery. ? ? _X__ 4.  Do Not Smoke or use e-cigarettes For 24 Hours Prior to Your Surgery. ?                Do not use any chewable tobacco products for at least 6 hours prior to ?                Surgery. ? ?_X__  5.  Do not use any recreational drugs (marijuana, cocaine, heroin, ecstasy, MDMA or other) ?               For at least one week prior to your surgery.  Combination of these drugs with anesthesia ?               May have life threatening results. ? ?____  6.  Bring all medications with you on the day of surgery if instructed.  ? ?__X__  7.  Notify your doctor if there is any change in your medical condition  ?    (cold, fever, infections). ?    ?Do not wear jewelry, make-up, hairpins, clips or nail polish. ?Do not wear lotions, powders, or perfumes. You may wear deodorant. ?Do not shave 48 hours prior to surgery.  ?Do not bring valuables to the hospital.   ? ?[facility] is not responsible for any belongings or valuables. ? ?Contacts, dentures or bridgework may not be worn into surgery. ?Leave your suitcase in the car. After  surgery it may be brought to your room. ?For patients admitted to the hospital, discharge time is determined by your ?treatment team. ?  ?Patients discharged the day of surgery will not be allowed to drive home.   ?Make arrangements for someone to be with you for the first 24 hours of your ?Same Day Discharge. ? ? ?__X__ Take these medicines the morning of surgery with A SIP OF WATER:  ? ? 1. amLODipine (NORVASC) 10 MG  ? 2. gabapentin (NEURONTIN) 800 MG ? 3. pantoprazole (PROTONIX) 40 MG  ? 4. ? 5. ? 6. ? ?____ Fleet Enema (as directed)  ? ?__X__ Use CHG Soap (or wipes) as directed ? ?____ Use Benzoyl Peroxide Gel as instructed ? ?____ Use inhalers on the day of surgery ? ?__X__ Stop metformin 2 days prior to surgery (take last dose Friday 03/15/22)   ? ?__X__ Take 1/2 of usual insulin dose the night before surgery. NO insulin  the morning ?         of surgery. insulin detemir (LEVEMIR) 100 UNIT/ML injection ? ?____ Call your PCP, cardiologist, or Pulmonologist if taking Coumadin/Plavix/aspirin and ask when to stop before your surgery.  ? ?__X__ One Week prior to surgery- Stop Anti-inflammatories such as Ibuprofen, Aleve, Advil, Motrin, meloxicam (MOBIC), diclofenac, etodolac, ketorolac, Toradol, Daypro, piroxicam, Goody's or BC powders. OK TO USE TYLENOL IF NEEDED ?  ?__X__ Stop supplements until after surgery.   ? ?____ Bring C-Pap to the hospital.  ? ? ?If you have any questions regarding your pre-procedure instructions,  ?Please call Pre-admit Testing at [phone] ?

## 2022-03-17 MED ORDER — ORAL CARE MOUTH RINSE: 15.0000 mL | Freq: Once | OROMUCOSAL | Status: AC

## 2022-03-17 MED ORDER — SODIUM CHLORIDE 0.9 % IV SOLN: INTRAVENOUS | Status: DC

## 2022-03-17 MED ORDER — GABAPENTIN 300 MG PO CAPS: 300.0000 mg | ORAL_CAPSULE | Freq: Once | ORAL | Status: DC

## 2022-03-17 MED ORDER — ACETAMINOPHEN 500 MG PO TABS: 1000.0000 mg | ORAL_TABLET | Freq: Once | ORAL | Status: AC

## 2022-03-17 MED ORDER — CELECOXIB 200 MG PO CAPS
200.0000 mg | ORAL_CAPSULE | Freq: Once | ORAL | Status: AC
Start: 2022-03-17 — End: 2022-03-18

## 2022-03-17 MED ORDER — CEFAZOLIN SODIUM-DEXTROSE 2-4 GM/100ML-% IV SOLN
2.0000 g | INTRAVENOUS | Status: AC
  Administered 2022-03-18: 2 g via INTRAVENOUS

## 2022-03-17 MED ORDER — CHLORHEXIDINE GLUCONATE 0.12 % MT SOLN: 15.0000 mL | Freq: Once | OROMUCOSAL | Status: AC

## 2022-03-17 MED ORDER — GABAPENTIN 600 MG PO TABS
300.0000 mg | ORAL_TABLET | Freq: Once | ORAL | Status: DC
  Filled 2022-03-17: qty 0.5

## 2022-03-17 NOTE — Anesthesia Preprocedure Evaluation (Addendum)
Anesthesia Evaluation  ?Patient identified by MRN, date of birth, ID band ?Patient awake ? ? ? ?Reviewed: ?Allergy & Precautions, NPO status , Patient's Chart, lab work & pertinent test results ? ?Airway ?Mallampati: III ? ?TM Distance: >3 FB ?Neck ROM: full ? ? ? Dental ? ?(+) Chipped, Missing ?  ?Pulmonary ?shortness of breath and with exertion,  ?  ?Pulmonary exam normal ? ? ? ? ? ? ? Cardiovascular ?hypertension, Normal cardiovascular exam ? ? ?  ?Neuro/Psych ?PSYCHIATRIC DISORDERS Anxiety Depression diabetic polyneuropathy ?  ? GI/Hepatic ?Neg liver ROS, GERD  Medicated,Mass of appendix ?  ?Endo/Other  ?diabetes, Type 2, Insulin DependentA1c 9.2 05/2021 ? Renal/GU ?  ? ?  ?Musculoskeletal ? ?(+) Arthritis ,  ? Abdominal ?(+) + obese,   ?Peds ? Hematology ? ?(+) Blood dyscrasia, anemia , Hgb 10.6   ?Anesthesia Other Findings ?Past Medical History: ?No date: Anxiety ?No date: Arthritis ?    Comment:  hand ?No date: Depression ?No date: Diabetes mellitus without complication ([place]) ?No date: Dyspnea ?    Comment:  DOE ?No date: Hyperlipemia ?No date: Hypertension ?No date: Neuropathy ? ?Past Surgical History: ?1995: ABDOMINAL HYSTERECTOMY ?09/25/2017: CATARACT EXTRACTION W/PHACO; Left ?    Comment:  Procedure: CATARACT EXTRACTION PHACO AND INTRAOCULAR  ?             LENS PLACEMENT ([place]);  Surgeon: Eulogio Bear, MD;   ?             Location: [facility];  Service: Ophthalmology;  Laterality: ?             Left;   ?flui pack lot # [id number] H ? [place] 00:32.7 ?AP%    ?             12.17 ?CDE   3.95 ?10/16/2017: CATARACT EXTRACTION W/PHACO; Right ?    Comment:  Procedure: CATARACT EXTRACTION PHACO AND INTRAOCULAR  ?             LENS PLACEMENT ([place]);  Surgeon: Eulogio Bear, MD;   ?             Location: [facility];  Service: Ophthalmology;  Laterality: ?             Right;  [place] 00:32.8 ?AP% 9.0 ?CDE 2.96 ?FLUID PACK LOT #  ?             [id number] ?05/27/2016: COLONOSCOPY WITH PROPOFOL;  N/A ?    Comment:  Procedure: COLONOSCOPY WITH PROPOFOL;  Surgeon: Herbie Baltimore T ?             Vira Agar, MD;  Location: [facility];  Service:  ?             Endoscopy;  Laterality: N/A; ?07/10/2021: COLONOSCOPY WITH PROPOFOL; N/A ?    Comment:  Procedure: COLONOSCOPY WITH PROPOFOL;  Surgeon:  ?             Lesly Rubenstein, MD;  Location: [facility];   ?             Service: Endoscopy;  Laterality: N/A; ?10/30/2021: COLONOSCOPY WITH PROPOFOL; N/A ?    Comment:  Procedure: COLONOSCOPY WITH PROPOFOL;  Surgeon:  ?             Lesly Rubenstein, MD;  Location: [facility];   ?             Service: Endoscopy;  Laterality: N/A; ?02/05/2022: COLONOSCOPY WITH PROPOFOL; N/A ?    Comment:  Procedure: COLONOSCOPY WITH PROPOFOL;  Surgeon:  ?             Lesly Rubenstein, MD;  Location: [facility];   ?             Service: Endoscopy;  Laterality: N/A; ?07/10/2021: ESOPHAGOGASTRODUODENOSCOPY (EGD) WITH PROPOFOL; N/A ?    Comment:  Procedure: ESOPHAGOGASTRODUODENOSCOPY (EGD) WITH  ?             PROPOFOL;  Surgeon: Lesly Rubenstein, MD;  Location:  ?             [facility];  Service: Endoscopy;  Laterality: N/A;   ?             IDDM ?10/30/2021: ESOPHAGOGASTRODUODENOSCOPY (EGD) WITH PROPOFOL; N/A ?    Comment:  Procedure: ESOPHAGOGASTRODUODENOSCOPY (EGD) WITH  ?             PROPOFOL;  Surgeon: Lesly Rubenstein, MD;  Location:  ?             [facility];  Service: Endoscopy;  Laterality: N/A;   ?             IDDM ?09/25/2017: EYE SURGERY; Left ?    Comment:  cataract extraction ?1995: HYSTERECTOMY SUPRACERVICAL ABDOMINAL W/REMOVAL TUBES &/OR  ?OVARIES ?10/16/2017: KENALOG INJECTION; Right ?    Comment:  Procedure: KENALOG INJECTION;  Surgeon: Benay Pillow  ?             Elta Guadeloupe, MD;  Location: [facility];  Service: Ophthalmology;   ?             Laterality: Right; ?1981: KNEE ARTHROCENTESIS; Left ?1992: KNEE ARTHROCENTESIS; Bilateral ?    Comment:  right one in 1992 ?No date: KNEE ARTHROCENTESIS;  Bilateral ? ? ? ? Reproductive/Obstetrics ?negative OB ROS ? ?  ? ? ? ? ? ? ? ? ? ? ? ? ? ?  ?  ? ? ? ? ? ? ? ?Anesthesia Physical ?Anesthesia Plan ? ?ASA: 3 ? ?Anesthesia Plan: General ETT  ? ?Post-op Pain Management: Celebrex PO (pre-op)* and Tylenol PO (pre-op)*  ? ?Induction: Intravenous ? ?PONV Risk Score and Plan: Ondansetron, Dexamethasone, Midazolam and Treatment may vary due to age or medical condition ? ?Airway Management Planned: LMA ? ?Additional Equipment:  ? ?Intra-op Plan:  ? ?Post-operative Plan: Extubation in OR ? ?Informed Consent: I have reviewed the patients History and Physical, chart, labs and discussed the procedure including the risks, benefits and alternatives for the proposed anesthesia with the patient or authorized representative who has indicated his/her understanding and acceptance.  ? ? ? ?Dental advisory given ? ?Plan Discussed with: Anesthesiologist, CRNA and Surgeon ? ?Anesthesia Plan Comments:   ? ? ? ? ? ?Anesthesia Quick Evaluation ? ?

## 2022-03-18 ENCOUNTER — Ambulatory Visit: Payer: Medicare HMO | Admitting: Anesthesiology

## 2022-03-18 ENCOUNTER — Other Ambulatory Visit: Payer: Self-pay

## 2022-03-18 ENCOUNTER — Ambulatory Visit: Payer: Medicare HMO

## 2022-03-18 ENCOUNTER — Encounter
Admission: RE | Disposition: A | Discharge status: Home / Self Care | Payer: Self-pay | Source: Home / Self Care | Attending: Internal Medicine

## 2022-03-18 ENCOUNTER — Observation Stay
Admission: RE | Admit: 2022-03-18 | Discharge: 2022-03-19 | Disposition: A | Discharge status: Home / Self Care | Payer: Medicare HMO | Attending: Internal Medicine | Admitting: Internal Medicine

## 2022-03-18 ENCOUNTER — Observation Stay: Payer: Medicare HMO

## 2022-03-18 ENCOUNTER — Encounter: Payer: Self-pay | Admitting: General Surgery

## 2022-03-18 DIAGNOSIS — Z8616 Personal history of COVID-19: Secondary | ICD-10-CM | POA: Diagnosis not present

## 2022-03-18 DIAGNOSIS — Z7984 Long term (current) use of oral hypoglycemic drugs: Secondary | ICD-10-CM | POA: Diagnosis not present

## 2022-03-18 DIAGNOSIS — D121 Benign neoplasm of appendix: Secondary | ICD-10-CM | POA: Diagnosis not present

## 2022-03-18 DIAGNOSIS — I10 Essential (primary) hypertension: Secondary | ICD-10-CM | POA: Diagnosis present

## 2022-03-18 DIAGNOSIS — Z7982 Long term (current) use of aspirin: Secondary | ICD-10-CM | POA: Diagnosis not present

## 2022-03-18 DIAGNOSIS — E119 Type 2 diabetes mellitus without complications: Secondary | ICD-10-CM

## 2022-03-18 DIAGNOSIS — J9601 Acute respiratory failure with hypoxia: Secondary | ICD-10-CM | POA: Diagnosis not present

## 2022-03-18 DIAGNOSIS — Z794 Long term (current) use of insulin: Secondary | ICD-10-CM | POA: Insufficient documentation

## 2022-03-18 DIAGNOSIS — Z79899 Other long term (current) drug therapy: Secondary | ICD-10-CM | POA: Insufficient documentation

## 2022-03-18 DIAGNOSIS — R0902 Hypoxemia: Secondary | ICD-10-CM

## 2022-03-18 DIAGNOSIS — Z9049 Acquired absence of other specified parts of digestive tract: Secondary | ICD-10-CM

## 2022-03-18 DIAGNOSIS — F32A Depression, unspecified: Secondary | ICD-10-CM | POA: Diagnosis present

## 2022-03-18 DIAGNOSIS — K388 Other specified diseases of appendix: Secondary | ICD-10-CM | POA: Diagnosis present

## 2022-03-18 DIAGNOSIS — D649 Anemia, unspecified: Secondary | ICD-10-CM | POA: Diagnosis present

## 2022-03-18 DIAGNOSIS — T8859XA Other complications of anesthesia, initial encounter: Secondary | ICD-10-CM

## 2022-03-18 HISTORY — DX: Other complications of anesthesia, initial encounter: T88.59XA

## 2022-03-18 HISTORY — PX: XI ROBOTIC LAPAROSCOPIC ASSISTED APPENDECTOMY: SHX6877

## 2022-03-18 LAB — POCT I-STAT, CHEM 8
BUN: 29 mg/dL — ABNORMAL HIGH (ref 8–23)
Calcium, Ion: 1.25 mmol/L (ref 1.15–1.40)
Chloride: 101 mmol/L (ref 98–111)
Creatinine, Ser: 0.8 mg/dL (ref 0.44–1.00)
Glucose, Bld: 258 mg/dL — ABNORMAL HIGH (ref 70–99)
HCT: 38 % (ref 36.0–46.0)
Hemoglobin: 12.9 g/dL (ref 12.0–15.0)
Potassium: 4.4 mmol/L (ref 3.5–5.1)
Sodium: 137 mmol/L (ref 135–145)
TCO2: 23 mmol/L (ref 22–32)

## 2022-03-18 LAB — GLUCOSE, CAPILLARY
Glucose-Capillary: 230 mg/dL — ABNORMAL HIGH (ref 70–99)
Glucose-Capillary: 267 mg/dL — ABNORMAL HIGH (ref 70–99)
Glucose-Capillary: 250 mg/dL — ABNORMAL HIGH (ref 70–99)
Glucose-Capillary: 238 mg/dL — ABNORMAL HIGH (ref 70–99)
Glucose-Capillary: 331 mg/dL — ABNORMAL HIGH (ref 70–99)
Glucose-Capillary: 340 mg/dL — ABNORMAL HIGH (ref 70–99)

## 2022-03-18 LAB — BLOOD GAS, ARTERIAL
Acid-base deficit: 3.1 mmol/L — ABNORMAL HIGH (ref 0.0–2.0)
Bicarbonate: 23.2 mmol/L (ref 20.0–28.0)
O2 Saturation: 81 %
Patient temperature: 37
pCO2 arterial: 46 mmHg (ref 32–48)
pH, Arterial: 7.31 — ABNORMAL LOW (ref 7.35–7.45)
pO2, Arterial: 51 mmHg — ABNORMAL LOW (ref 83–108)

## 2022-03-18 LAB — TROPONIN I (HIGH SENSITIVITY)
Troponin I (High Sensitivity): 3 ng/L (ref <18)
Troponin I (High Sensitivity): 4 ng/L (ref <18)

## 2022-03-18 LAB — HIV ANTIBODY (ROUTINE TESTING W REFLEX): HIV Screen 4th Generation wRfx: NONREACTIVE

## 2022-03-18 SURGERY — APPENDECTOMY, ROBOT-ASSISTED, LAPAROSCOPIC
Anesthesia: General | Site: Abdomen

## 2022-03-18 MED ORDER — GABAPENTIN 300 MG PO CAPS
ORAL_CAPSULE | ORAL | Status: AC
  Filled 2022-03-18: qty 1

## 2022-03-18 MED ORDER — SUGAMMADEX SODIUM 500 MG/5ML IV SOLN
INTRAVENOUS | Status: DC | PRN
  Administered 2022-03-18: 100 mg via INTRAVENOUS
  Administered 2022-03-18: 300 mg via INTRAVENOUS
  Administered 2022-03-18: 100 mg via INTRAVENOUS

## 2022-03-18 MED ORDER — CEFAZOLIN SODIUM-DEXTROSE 2-4 GM/100ML-% IV SOLN
INTRAVENOUS | Status: AC
Start: 2022-03-18 — End: 2022-03-18
  Filled 2022-03-18: qty 100

## 2022-03-18 MED ORDER — BUPIVACAINE-EPINEPHRINE (PF) 0.5% -1:200000 IJ SOLN
INTRAMUSCULAR | Status: DC | PRN
  Administered 2022-03-18: 30 mL

## 2022-03-18 MED ORDER — DEXMEDETOMIDINE HCL IN NACL 200 MCG/50ML IV SOLN
INTRAVENOUS | Status: DC | PRN
  Administered 2022-03-18: 8 ug via INTRAVENOUS

## 2022-03-18 MED ORDER — SODIUM CHLORIDE 0.9% FLUSH
3.0000 mL | Freq: Two times a day (BID) | INTRAVENOUS | Status: DC
  Administered 2022-03-18 – 2022-03-19 (×3): 3 mL via INTRAVENOUS

## 2022-03-18 MED ORDER — CELECOXIB 200 MG PO CAPS
ORAL_CAPSULE | ORAL | Status: AC
  Administered 2022-03-18: 200 mg via ORAL
  Filled 2022-03-18: qty 1

## 2022-03-18 MED ORDER — INSULIN ASPART 100 UNIT/ML IJ SOLN
INTRAMUSCULAR | Status: AC
  Administered 2022-03-18: 5 [IU] via SUBCUTANEOUS
  Filled 2022-03-18: qty 1

## 2022-03-18 MED ORDER — INSULIN ASPART 100 UNIT/ML IJ SOLN
INTRAMUSCULAR | Status: AC
  Filled 2022-03-18: qty 1

## 2022-03-18 MED ORDER — SODIUM CHLORIDE 0.9 % IV SOLN: 250.0000 mL | INTRAVENOUS | Status: DC | PRN

## 2022-03-18 MED ORDER — ACETAMINOPHEN 10 MG/ML IV SOLN
INTRAVENOUS | Status: AC
  Administered 2022-03-18: 1000 mg via INTRAVENOUS
  Filled 2022-03-18: qty 100

## 2022-03-18 MED ORDER — ORAL CARE MOUTH RINSE
15.0000 mL | Freq: Two times a day (BID) | OROMUCOSAL | Status: DC
  Administered 2022-03-19: 15 mL via OROMUCOSAL

## 2022-03-18 MED ORDER — PRAVASTATIN SODIUM 10 MG PO TABS
10.0000 mg | ORAL_TABLET | Freq: Every day | ORAL | Status: DC
  Administered 2022-03-18: 10 mg via ORAL
  Filled 2022-03-18: qty 1

## 2022-03-18 MED ORDER — INSULIN DETEMIR 100 UNIT/ML ~~LOC~~ SOLN
30.0000 [IU] | Freq: Every day | SUBCUTANEOUS | Status: DC
  Administered 2022-03-18: 30 [IU] via SUBCUTANEOUS
  Filled 2022-03-18 (×2): qty 0.3

## 2022-03-18 MED ORDER — PROMETHAZINE HCL 25 MG/ML IJ SOLN: 6.2500 mg | INTRAMUSCULAR | Status: DC | PRN

## 2022-03-18 MED ORDER — ONDANSETRON HCL 4 MG PO TABS: 4.0000 mg | ORAL_TABLET | Freq: Four times a day (QID) | ORAL | Status: DC | PRN

## 2022-03-18 MED ORDER — FENTANYL CITRATE (PF) 100 MCG/2ML IJ SOLN
INTRAMUSCULAR | Status: AC
  Filled 2022-03-18: qty 2

## 2022-03-18 MED ORDER — ONDANSETRON HCL 4 MG/2ML IJ SOLN
INTRAMUSCULAR | Status: AC
  Filled 2022-03-18: qty 2

## 2022-03-18 MED ORDER — OXYCODONE HCL 5 MG/5ML PO SOLN: 5.0000 mg | Freq: Once | ORAL | Status: AC | PRN

## 2022-03-18 MED ORDER — GLYCOPYRROLATE 0.2 MG/ML IJ SOLN
INTRAMUSCULAR | Status: DC | PRN
  Administered 2022-03-18: .2 mg via INTRAVENOUS

## 2022-03-18 MED ORDER — GABAPENTIN 400 MG PO CAPS
800.0000 mg | ORAL_CAPSULE | Freq: Three times a day (TID) | ORAL | Status: DC
  Administered 2022-03-18 – 2022-03-19 (×3): 800 mg via ORAL
  Filled 2022-03-18 (×3): qty 2

## 2022-03-18 MED ORDER — PROPOFOL 10 MG/ML IV BOLUS
INTRAVENOUS | Status: DC | PRN
  Administered 2022-03-18: 120 mg via INTRAVENOUS

## 2022-03-18 MED ORDER — MIDAZOLAM HCL 2 MG/2ML IJ SOLN
INTRAMUSCULAR | Status: DC | PRN
  Administered 2022-03-18 (×2): 1 mg via INTRAVENOUS

## 2022-03-18 MED ORDER — SUGAMMADEX SODIUM 500 MG/5ML IV SOLN
INTRAVENOUS | Status: DC | PRN
Start: 2022-03-18 — End: 2022-03-18

## 2022-03-18 MED ORDER — DEXAMETHASONE SODIUM PHOSPHATE 10 MG/ML IJ SOLN
INTRAMUSCULAR | Status: DC | PRN
  Administered 2022-03-18: 10 mg via INTRAVENOUS

## 2022-03-18 MED ORDER — IPRATROPIUM-ALBUTEROL 0.5-2.5 (3) MG/3ML IN SOLN: 3.0000 mL | Freq: Once | RESPIRATORY_TRACT | Status: AC

## 2022-03-18 MED ORDER — ACETAMINOPHEN 10 MG/ML IV SOLN: 1000.0000 mg | Freq: Once | INTRAVENOUS | Status: DC | PRN

## 2022-03-18 MED ORDER — FENTANYL CITRATE (PF) 100 MCG/2ML IJ SOLN
25.0000 ug | INTRAMUSCULAR | Status: DC | PRN
  Administered 2022-03-18: 25 ug via INTRAVENOUS

## 2022-03-18 MED ORDER — INSULIN ASPART 100 UNIT/ML IJ SOLN
8.0000 [IU] | Freq: Once | INTRAMUSCULAR | Status: AC
  Administered 2022-03-18: 8 [IU] via SUBCUTANEOUS

## 2022-03-18 MED ORDER — MIDAZOLAM HCL 2 MG/2ML IJ SOLN
INTRAMUSCULAR | Status: AC
  Filled 2022-03-18: qty 2

## 2022-03-18 MED ORDER — INSULIN ASPART 100 UNIT/ML IV SOLN
5.0000 [IU] | Freq: Once | INTRAVENOUS | Status: AC
  Filled 2022-03-18: qty 0.05

## 2022-03-18 MED ORDER — ALBUTEROL SULFATE (2.5 MG/3ML) 0.083% IN NEBU: 2.5000 mg | INHALATION_SOLUTION | Freq: Four times a day (QID) | RESPIRATORY_TRACT | Status: DC | PRN

## 2022-03-18 MED ORDER — OXYCODONE HCL 5 MG PO TABS
ORAL_TABLET | ORAL | Status: AC
  Filled 2022-03-18: qty 1

## 2022-03-18 MED ORDER — DEXAMETHASONE SODIUM PHOSPHATE 10 MG/ML IJ SOLN
INTRAMUSCULAR | Status: AC
  Filled 2022-03-18: qty 1

## 2022-03-18 MED ORDER — ACETAMINOPHEN 325 MG PO TABS
650.0000 mg | ORAL_TABLET | Freq: Two times a day (BID) | ORAL | Status: DC
  Administered 2022-03-18 – 2022-03-19 (×2): 650 mg via ORAL
  Filled 2022-03-18 (×3): qty 2

## 2022-03-18 MED ORDER — HYDROCODONE-ACETAMINOPHEN 5-325 MG PO TABS: 1.0000 | ORAL_TABLET | Freq: Four times a day (QID) | ORAL | Status: DC | PRN

## 2022-03-18 MED ORDER — CHLORHEXIDINE GLUCONATE 0.12 % MT SOLN
OROMUCOSAL | Status: AC
  Administered 2022-03-18: 15 mL via OROMUCOSAL
  Filled 2022-03-18: qty 15

## 2022-03-18 MED ORDER — FENTANYL CITRATE (PF) 100 MCG/2ML IJ SOLN
INTRAMUSCULAR | Status: DC | PRN
  Administered 2022-03-18 (×2): 25 ug via INTRAVENOUS
  Administered 2022-03-18: 50 ug via INTRAVENOUS

## 2022-03-18 MED ORDER — IPRATROPIUM-ALBUTEROL 0.5-2.5 (3) MG/3ML IN SOLN
3.0000 mL | Freq: Three times a day (TID) | RESPIRATORY_TRACT | Status: DC
  Administered 2022-03-19 (×2): 3 mL via RESPIRATORY_TRACT
  Filled 2022-03-18 (×2): qty 3

## 2022-03-18 MED ORDER — INSULIN ASPART 100 UNIT/ML IJ SOLN
5.0000 [IU] | Freq: Once | INTRAMUSCULAR | Status: AC
  Administered 2022-03-18: 5 [IU] via SUBCUTANEOUS

## 2022-03-18 MED ORDER — HYDROCODONE-ACETAMINOPHEN 5-325 MG PO TABS: 1.0000 | ORAL_TABLET | ORAL | 0 refills | Status: AC | PRN

## 2022-03-18 MED ORDER — INSULIN ASPART 100 UNIT/ML IJ SOLN
0.0000 [IU] | Freq: Three times a day (TID) | INTRAMUSCULAR | Status: DC
  Administered 2022-03-18 – 2022-03-19 (×2): 11 [IU] via SUBCUTANEOUS
  Administered 2022-03-19: 5 [IU] via SUBCUTANEOUS
  Filled 2022-03-18 (×3): qty 1

## 2022-03-18 MED ORDER — ASPIRIN 81 MG PO CHEW
81.0000 mg | CHEWABLE_TABLET | Freq: Every day | ORAL | Status: DC
  Administered 2022-03-18 – 2022-03-19 (×2): 81 mg via ORAL
  Filled 2022-03-18 (×2): qty 1

## 2022-03-18 MED ORDER — SODIUM CHLORIDE 0.9% FLUSH: 3.0000 mL | INTRAVENOUS | Status: DC | PRN

## 2022-03-18 MED ORDER — CITALOPRAM HYDROBROMIDE 20 MG PO TABS
20.0000 mg | ORAL_TABLET | Freq: Every day | ORAL | Status: DC
  Administered 2022-03-18: 20 mg via ORAL
  Filled 2022-03-18: qty 1

## 2022-03-18 MED ORDER — ROCURONIUM BROMIDE 100 MG/10ML IV SOLN
INTRAVENOUS | Status: DC | PRN
  Administered 2022-03-18 (×2): 50 mg via INTRAVENOUS

## 2022-03-18 MED ORDER — OXYCODONE HCL 5 MG PO TABS
5.0000 mg | ORAL_TABLET | Freq: Once | ORAL | Status: AC
  Administered 2022-03-18: 5 mg via ORAL

## 2022-03-18 MED ORDER — ONDANSETRON HCL 4 MG/2ML IJ SOLN
INTRAMUSCULAR | Status: DC | PRN
  Administered 2022-03-18: 4 mg via INTRAVENOUS

## 2022-03-18 MED ORDER — FIBRIN SEALANT 2 ML SINGLE DOSE KIT
PACK | CUTANEOUS | Status: DC | PRN
  Administered 2022-03-18: 2 mL via TOPICAL

## 2022-03-18 MED ORDER — ACETAMINOPHEN 500 MG PO TABS
ORAL_TABLET | ORAL | Status: AC
  Administered 2022-03-18: 1000 mg via ORAL
  Filled 2022-03-18: qty 2

## 2022-03-18 MED ORDER — PHENYLEPHRINE HCL (PRESSORS) 10 MG/ML IV SOLN
INTRAVENOUS | Status: DC | PRN
  Administered 2022-03-18 (×2): 80 ug via INTRAVENOUS

## 2022-03-18 MED ORDER — PROPOFOL 10 MG/ML IV BOLUS
INTRAVENOUS | Status: AC
  Filled 2022-03-18: qty 20

## 2022-03-18 MED ORDER — IPRATROPIUM-ALBUTEROL 0.5-2.5 (3) MG/3ML IN SOLN
RESPIRATORY_TRACT | Status: AC
  Administered 2022-03-18: 3 mL via RESPIRATORY_TRACT
  Filled 2022-03-18: qty 3

## 2022-03-18 MED ORDER — IPRATROPIUM-ALBUTEROL 0.5-2.5 (3) MG/3ML IN SOLN
3.0000 mL | Freq: Four times a day (QID) | RESPIRATORY_TRACT | Status: DC
  Administered 2022-03-18: 3 mL via RESPIRATORY_TRACT
  Filled 2022-03-18: qty 3

## 2022-03-18 MED ORDER — PANTOPRAZOLE SODIUM 40 MG PO TBEC
40.0000 mg | DELAYED_RELEASE_TABLET | Freq: Every day | ORAL | Status: DC
  Administered 2022-03-18 – 2022-03-19 (×2): 40 mg via ORAL
  Filled 2022-03-18 (×2): qty 1

## 2022-03-18 MED ORDER — AMLODIPINE BESYLATE 10 MG PO TABS
10.0000 mg | ORAL_TABLET | Freq: Every day | ORAL | Status: DC
  Administered 2022-03-19: 10 mg via ORAL
  Filled 2022-03-18: qty 1

## 2022-03-18 MED ORDER — BUPIVACAINE-EPINEPHRINE (PF) 0.5% -1:200000 IJ SOLN
INTRAMUSCULAR | Status: AC
  Filled 2022-03-18: qty 30

## 2022-03-18 MED ORDER — LIDOCAINE HCL (CARDIAC) PF 100 MG/5ML IV SOSY
PREFILLED_SYRINGE | INTRAVENOUS | Status: DC | PRN
  Administered 2022-03-18: 60 mg via INTRAVENOUS

## 2022-03-18 MED ORDER — ONDANSETRON HCL 4 MG/2ML IJ SOLN: 4.0000 mg | Freq: Four times a day (QID) | INTRAMUSCULAR | Status: DC | PRN

## 2022-03-18 MED ORDER — IOHEXOL 350 MG/ML SOLN
75.0000 mL | Freq: Once | INTRAVENOUS | Status: AC | PRN
  Administered 2022-03-18: 75 mL via INTRAVENOUS

## 2022-03-18 MED ORDER — OXYCODONE HCL 5 MG PO TABS
5.0000 mg | ORAL_TABLET | Freq: Once | ORAL | Status: AC | PRN
  Administered 2022-03-18: 5 mg via ORAL

## 2022-03-18 SURGICAL SUPPLY — 66 items
APPLICATOR VISTASEAL 35 (MISCELLANEOUS) ×1 IMPLANT
BAG INFUSER PRESSURE 100CC (MISCELLANEOUS) IMPLANT
BAG RETRIEVAL 10 (BASKET) ×1
BLADE SURG SZ11 CARB STEEL (BLADE) ×2 IMPLANT
CANNULA REDUC XI 12-8 STAPL (CANNULA) ×1
CANNULA REDUCER 12-8 DVNC XI (CANNULA) ×1 IMPLANT
COVER TIP SHEARS 8 DVNC (MISCELLANEOUS) ×1 IMPLANT
COVER TIP SHEARS 8MM DA VINCI (MISCELLANEOUS) ×1
DERMABOND ADVANCED (GAUZE/BANDAGES/DRESSINGS) ×1
DERMABOND ADVANCED .7 DNX12 (GAUZE/BANDAGES/DRESSINGS) ×1 IMPLANT
DRAPE ARM DVNC X/XI (DISPOSABLE) ×4 IMPLANT
DRAPE COLUMN DVNC XI (DISPOSABLE) ×1 IMPLANT
DRAPE DA VINCI XI ARM (DISPOSABLE) ×4
DRAPE DA VINCI XI COLUMN (DISPOSABLE) ×1
ELECT REM PT RETURN 9FT ADLT (ELECTROSURGICAL) ×2
ELECTRODE REM PT RTRN 9FT ADLT (ELECTROSURGICAL) ×1 IMPLANT
GLOVE SURG SYN 6.5 ES PF (GLOVE) ×8 IMPLANT
GLOVE SURG SYN 6.5 PF PI (GLOVE) ×2 IMPLANT
GLOVE SURG UNDER POLY LF SZ6.5 (GLOVE) ×6 IMPLANT
GOWN STRL REUS W/ TWL LRG LVL3 (GOWN DISPOSABLE) ×3 IMPLANT
GOWN STRL REUS W/TWL LRG LVL3 (GOWN DISPOSABLE) ×4
GRASPER SUT TROCAR 14GX15 (MISCELLANEOUS) ×1 IMPLANT
IRRIGATOR SUCT 8 DISP DVNC XI (IRRIGATION / IRRIGATOR) IMPLANT
IRRIGATOR SUCTION 8MM XI DISP (IRRIGATION / IRRIGATOR)
IV NS 1000ML (IV SOLUTION)
IV NS 1000ML BAXH (IV SOLUTION) IMPLANT
KIT PINK PAD W/HEAD ARE REST (MISCELLANEOUS) ×2
KIT PINK PAD W/HEAD ARM REST (MISCELLANEOUS) ×1 IMPLANT
LABEL OR SOLS (LABEL) IMPLANT
MANIFOLD NEPTUNE II (INSTRUMENTS) ×2 IMPLANT
NDL INSUFFLATION 14GA 120MM (NEEDLE) ×1 IMPLANT
NEEDLE HYPO 22GX1.5 SAFETY (NEEDLE) ×2 IMPLANT
NEEDLE INSUFFLATION 14GA 120MM (NEEDLE) ×2 IMPLANT
OBTURATOR OPTICAL STANDARD 8MM (TROCAR) ×1
OBTURATOR OPTICAL STND 8 DVNC (TROCAR) ×1
OBTURATOR OPTICALSTD 8 DVNC (TROCAR) ×1 IMPLANT
PACK LAP CHOLECYSTECTOMY (MISCELLANEOUS) ×2 IMPLANT
RELOAD STAPLE 45 2.5 WHT DVNC (STAPLE) IMPLANT
RELOAD STAPLE 45 3.5 BLU DVNC (STAPLE) ×1 IMPLANT
RELOAD STAPLER 2.5X45 WHT DVNC (STAPLE) IMPLANT
RELOAD STAPLER 3.5X45 BLU DVNC (STAPLE) ×1 IMPLANT
SEAL CANN UNIV 5-8 DVNC XI (MISCELLANEOUS) ×3 IMPLANT
SEAL XI 5MM-8MM UNIVERSAL (MISCELLANEOUS) ×3
SEALER VESSEL DA VINCI XI (MISCELLANEOUS)
SEALER VESSEL EXT DVNC XI (MISCELLANEOUS) IMPLANT
SET TUBE SMOKE EVAC HIGH FLOW (TUBING) ×2 IMPLANT
SOLUTION ELECTROLUBE (MISCELLANEOUS) ×2 IMPLANT
SPONGE T-LAP 4X18 ~~LOC~~+RFID (SPONGE) ×1 IMPLANT
STAPLER 45 DA VINCI SURE FORM (STAPLE) ×1
STAPLER 45 SUREFORM DVNC (STAPLE) ×1 IMPLANT
STAPLER CANNULA SEAL DVNC XI (STAPLE) ×1 IMPLANT
STAPLER CANNULA SEAL XI (STAPLE) ×1
STAPLER RELOAD 2.5X45 WHITE (STAPLE)
STAPLER RELOAD 2.5X45 WHT DVNC (STAPLE)
STAPLER RELOAD 3.5X45 BLU DVNC (STAPLE) ×1
STAPLER RELOAD 3.5X45 BLUE (STAPLE) ×1
SUT MNCRL AB 4-0 PS2 18 (SUTURE) ×2 IMPLANT
SUT VIC AB 2-0 SH 27 (SUTURE) ×1
SUT VIC AB 2-0 SH 27XBRD (SUTURE) ×1 IMPLANT
SUT VICRYL 0 AB UR-6 (SUTURE) ×2 IMPLANT
SUT VLOC 90 6 CV-15 VIOLET (SUTURE) ×1 IMPLANT
SYR 30ML LL (SYRINGE) ×2 IMPLANT
SYS BAG RETRIEVAL 10MM (BASKET) ×1
SYSTEM BAG RETRIEVAL 10MM (BASKET) ×1 IMPLANT
TRAY FOLEY MTR SLVR 16FR STAT (SET/KITS/TRAYS/PACK) ×1 IMPLANT
WATER STERILE IRR 500ML POUR (IV SOLUTION) ×1 IMPLANT

## 2022-03-18 NOTE — Assessment & Plan Note (Signed)
Patient is status post robotic assisted laparoscopic appendectomy for a large polyp at the base of the appendix unable to be resected endoscopically. ?Follow-up with surgery as an outpatient ?

## 2022-03-18 NOTE — Anesthesia Postprocedure Evaluation (Signed)
Anesthesia Post Note ? ?Patient: Emma White ? ?Procedure(s) Performed: XI ROBOTIC LAPAROSCOPIC ASSISTED APPENDECTOMY ([place]dom[place]) ? ?Patient location during evaluation: PACU ?Anesthesia Type: General ?Level of consciousness: awake and alert ?Pain management: pain level controlled ?Vital Signs Assessment: post-procedure vital signs reviewed and stable ?Respiratory status: spontaneous breathing, nonlabored ventilation, respiratory function stable and patient connected to nasal cannula oxygen ?Cardiovascular status: blood pressure returned to baseline and stable ?Postop Assessment: no apparent nausea or vomiting ?Anesthetic complications: no ?Comments: Pt sats on RA in 70's - 80's while in pacu without symptoms of dyspnea, chest pain. She only has abdominal pain that is mild post-surgery. She has worked with IS and is up to recliner chair.  Duoneb done. RA saturation improves to the 90's with deep breaths but quickly rebound to hypoxia. Lungs sounds are clear bilaterally with no signs of heart failure. ABG with metabolic acidosis and hypoxia. CXR with atelectasis. EKG and bedside echo with signs of right heart strain, PE. Pt has history of hypoxia with home oxygen after covid infection 2020. No recent smoking history with no diagnosis of COPD found. Pt is anemic but last hbg 10.6 recently. Pt reports chronic intermittent melena but denies recent worsening melena. The hospitalist was consulted for further work-up.  ? ? ?No notable events documented. ? ? ?Last Vitals:  ?Vitals:  ? 03/18/22 1245 03/18/22 1300  ?BP:  (!) 122/57  ?Pulse: 77 78  ?Resp: 11 19  ?Temp:    ?SpO2: 95% 96%  ?  ?Last Pain:  ?Vitals:  ? 03/18/22 1230  ?TempSrc:   ?PainSc: 3   ? ? ?  ?  ?  ?  ?  ?  ? ?[place] Ouch ? ? ? ? ?

## 2022-03-18 NOTE — H&P (Addendum)
?History and Physical  ? ? ?Patient: Emma White JFH:[id number] DOB: [birth year] ?DOA: 03/18/2022 ?DOS: the patient was seen and examined on 03/18/2022 ?PCP: Rutherford Limerick, PA  ?Patient coming from: Home ? ?Chief Complaint: No chief complaint on file. ? ?HPI: Emma White is a 66 y.o. female with medical history significant for diabetes mellitus, depression, anxiety, hypertension who is status post robotic laparoscopic assisted appendectomy.  Postop patient was noted to be hypoxic and had room air pulse oximetry in the 70s and 80s.  She denied having any shortness of breath or chest pain.  She was placed on 2 L of oxygen with improvement in her pulse oximetry to 92%. ?She received fentanyl for pain and initially had hypoxia was thought to be opioid induced.  Patient was monitored in PACU for several hours without any significant change or improvement and so medical consult was requested. ?Patient had COVID-19 pneumonia in 2020 and was discharged home on 2 L of oxygen.  She has not required oxygen supplementation over the last 2 years.  She denies nicotine use and denies having a history of structural lung disease. ?She has a history of chronic intermittent melena and is noted to have a 2 g drop in her H&H from 12.2 >>10.6. ?Arterial blood gas showed hypoxia with a PO2 of 51 and uncompensated pH of 7.31 ?She complains of shortness of breath with exertion but denies having any leg swelling, no orthopnea, no chest pain, no cough or fever, no chills, no abdominal pain, no changes in her bowel habits, no urinary symptoms, no blurred vision or focal deficit. ?She will be referred to observation status ?Review of Systems: As mentioned in the history of present illness. All other systems reviewed and are negative. ?Past Medical History:  ?Diagnosis Date  ? Anxiety   ? Arthritis   ? hand  ? Complication of anesthesia 03/18/2022  ? Hypoxia in pacu. See pacu note 03/18/2022. The hospitalist was consulted for further work-up.   ? Depression   ? Diabetes mellitus without complication ([place])   ? Dyspnea   ? DOE  ? Hyperlipemia   ? Hypertension   ? Neuropathy   ? ?Past Surgical History:  ?Procedure Laterality Date  ? ABDOMINAL HYSTERECTOMY  1995  ? CATARACT EXTRACTION W/PHACO Left 09/25/2017  ? Procedure: CATARACT EXTRACTION PHACO AND INTRAOCULAR LENS PLACEMENT ([facility]);  Surgeon: Eulogio Bear, MD;  Location: [facility];  Service: Ophthalmology;  Laterality: Left;   ?flui pack lot # [id number] H ? [place] 00:32.7 ?AP%   12.17 ?CDE   3.95  ? CATARACT EXTRACTION W/PHACO Right 10/16/2017  ? Procedure: CATARACT EXTRACTION PHACO AND INTRAOCULAR LENS PLACEMENT ([facility]);  Surgeon: Eulogio Bear, MD;  Location: [facility];  Service: Ophthalmology;  Laterality: Right;  [place] 00:32.8 ?AP% 9.0 ?CDE 2.96 ?FLUID PACK LOT # [id number]  ? COLONOSCOPY WITH PROPOFOL N/A 05/27/2016  ? Procedure: COLONOSCOPY WITH PROPOFOL;  Surgeon: Manya Silvas, MD;  Location: [facility];  Service: Endoscopy;  Laterality: N/A;  ? COLONOSCOPY WITH PROPOFOL N/A 07/10/2021  ? Procedure: COLONOSCOPY WITH PROPOFOL;  Surgeon: Lesly Rubenstein, MD;  Location: [facility] ENDOSCOPY;  Service: Endoscopy;  Laterality: N/A;  ? COLONOSCOPY WITH PROPOFOL N/A 10/30/2021  ? Procedure: COLONOSCOPY WITH PROPOFOL;  Surgeon: Lesly Rubenstein, MD;  Location: [facility] ENDOSCOPY;  Service: Endoscopy;  Laterality: N/A;  ? COLONOSCOPY WITH PROPOFOL N/A 02/05/2022  ? Procedure: COLONOSCOPY WITH PROPOFOL;  Surgeon: Lesly Rubenstein, MD;  Location: [facility] END[facility]COPY;  Service: Endoscopy;  Laterality: N/A;  ?  ESOPHAGOGASTRODUODENOSCOPY (EGD) WITH PROPOFOL N/A 07/10/2021  ? Procedure: ESOPHAGOGASTRODUODENOSCOPY (EGD) WITH PROPOFOL;  Surgeon: Lesly Rubenstein, MD;  Location: [facility] ENDOSCOPY;  Service: Endoscopy;  Laterality: N/A;  IDDM  ? ESOPHAGOGASTRODUODENOSCOPY (EGD) WITH PROPOFOL N/A 10/30/2021  ? Procedure: ESOPHAGOGASTRODUODENOSCOPY (EGD) WITH PROPOFOL;  Surgeon: Lesly Rubenstein, MD;  Location: [facility]  ENDOSCOPY;  Service: Endoscopy;  Laterality: N/A;  IDDM  ? EYE SURGERY Left 09/25/2017  ? cataract extraction  ? HYSTERECTOMY SUPRACERVICAL ABDOMINAL W/REMOVAL TUBES &/OR OVARIES  1995  ? KENALOG INJECTION Right 10/16/2017  ? Procedure: KENALOG INJECTION;  Surgeon: Eulogio Bear, MD;  Location: [facility] OR[facility];  Service: Ophthalmology;  Laterality: Right;  ? KNEE ARTHROCENTESIS Left 1981  ? KNEE ARTHROCENTESIS Bilateral 1992  ? right one in 1992  ? KNEE ARTHROCENTESIS Bilateral   ? ?Social History:  reports that she has never smoked. She has never used smokeless tobacco. She reports that she does not drink alcohol and does not use drugs. ? ?No Known Allergies ? ?Family History  ?Problem Relation Age of Onset  ? Breast cancer Neg Hx   ? ? ?Prior to Admission medications   ?Medication Sig Start Date End Date Taking? Authorizing Provider  ?acetaminophen (TYLENOL) 650 MG CR tablet Take 650 mg by mouth 2 (two) times daily.   Yes [provider]  ?amLODipine (NORVASC) 10 MG tablet Take 10 mg by mouth daily.   Yes [provider]  ?aspirin 81 MG chewable tablet Chew 81 mg by mouth daily.   Yes [provider]  ?benzonatate (TESSALON) 100 MG capsule Take 100 mg by mouth 3 (three) times daily as needed for cough.   Yes [provider]  ?citalopram (CELEXA) 20 MG tablet Take 20 mg by mouth daily. With dinner   Yes [provider]  ?gabapentin (NEURONTIN) 800 MG tablet Take 800 mg by mouth 3 (three) times daily.   Yes [provider]  ?HYDROcodone-acetaminophen (NORCO) 5-325 MG tablet Take 1 tablet by mouth every 4 (four) hours as needed for up to 3 days for moderate pain. 03/18/22 03/21/22 Yes Herbert Pun, MD  ?ibuprofen (ADVIL,MOTRIN) 200 MG tablet Take 400 mg by mouth every 6 (six) hours as needed for moderate pain.   Yes [provider]  ?insulin aspart (NOVOLOG) 100 UNIT/ML injection Inject 10 Units into the skin 3 (three) times daily with meals.   Yes  [provider]  ?insulin detemir (LEVEMIR) 100 UNIT/ML injection Inject 30 Units into the skin at bedtime.    Yes [provider]  ?metFORMIN (GLUCOPHAGE) 500 MG tablet Take 500 mg by mouth 3 (three) times daily.   Yes [provider]  ?pantoprazole (PROTONIX) 40 MG tablet Take 40 mg by mouth daily.   Yes [provider]  ?Potassium 99 MG TABS Take 1 tablet by mouth daily. With breakfast   Yes [provider]  ?Potassium Gluconate 2.5 MEQ TABS Take 2.5 mEq by mouth daily.   Yes [provider]  ?pravastatin (PRAVACHOL) 10 MG tablet Take 10 mg by mouth daily.   Yes [provider]  ?sitaGLIPtin (JANUVIA) 100 MG tablet Take 100 mg by mouth daily.   Yes [provider]  ?Cholecalciferol (VITAMIN D3) 125 MCG (5000 UT) CAPS Take by mouth.    [provider]  ? ? ?Physical Exam: ?Vitals:  ? 03/18/22 1300 03/18/22 1315 03/18/22 1330 03/18/22 1456  ?BP: (!) 122/57  105/88 118/67  ?Pulse: 78 78 78 80  ?Resp: 19 (!) 23 (!) 23  19  ?Temp:   97.6 ?F (36.4 ?C) 97.6 ?F (36.4 ?C)  ?TempSrc:      ?SpO2: 96% 96% 92% 92%  ?Weight:      ?Height:      ? ?Physical Exam ?Vitals and nursing note reviewed.  ?Constitutional:   ?   Appearance: Normal appearance. She is normal weight.  ?HENT:  ?   Head: Normocephalic and atraumatic.  ?   Nose: Nose normal.  ?   Comments: Nasal cannula in place ?   Mouth/Throat:  ?   Mouth: Mucous membranes are moist.  ?Eyes:  ?   Conjunctiva/sclera: Conjunctivae normal.  ?   Pupils: Pupils are equal, round, and reactive to light.  ?Cardiovascular:  ?   Rate and Rhythm: Normal rate and regular rhythm.  ?Pulmonary:  ?   Effort: Pulmonary effort is normal.  ?   Breath sounds: Normal breath sounds.  ?Abdominal:  ?   General: Abdomen is flat. Bowel sounds are normal.  ?   Palpations: Abdomen is soft.  ?Musculoskeletal:     ?   General: Normal range of motion.  ?   Cervical back: Normal range of motion and neck supple.  ?Skin: ?    General: Skin is warm and dry.  ?Neurological:  ?   General: No focal deficit present.  ?   Mental Status: She is alert and oriented to person, place, and time.  ?Psychiatric:     ?   Mood and Affect: Mood norma

## 2022-03-18 NOTE — Assessment & Plan Note (Signed)
Blood pressure is stable Continue amlodipine 

## 2022-03-18 NOTE — Discharge Instructions (Addendum)
  Diet: Resume home heart healthy regular diet.   Activity: No heavy lifting >20 pounds (children, pets, laundry, garbage) or strenuous activity until follow-up, but light activity and walking are encouraged. Do not drive or drink alcohol if taking narcotic pain medications.  Wound care: May shower with soapy water and pat dry (do not rub incisions), but no baths or submerging incision underwater until follow-up. (no swimming)   Medications: Resume all home medications. For mild to moderate pain: acetaminophen (Tylenol) ***or ibuprofen (if no kidney disease). Combining Tylenol with alcohol can substantially increase your risk of causing liver disease. Narcotic pain medications, if prescribed, can be used for severe pain, though may cause nausea, constipation, and drowsiness. Do not combine Tylenol and Norco within a 6 hour period as Norco contains Tylenol. If you do not need the narcotic pain medication, you do not need to fill the prescription.  Call office ([phone]) at any time if any questions, worsening pain, fevers/chills, bleeding, drainage from incision site, or other concerns.   AMBULATORY SURGERY  DISCHARGE INSTRUCTIONS   The drugs that you were given will stay in your system until tomorrow so for the next 24 hours you should not:  Drive an automobile Make any legal decisions Drink any alcoholic beverage   You may resume regular meals tomorrow.  Today it is better to start with liquids and gradually work up to solid foods.  You may eat anything you prefer, but it is better to start with liquids, then soup and crackers, and gradually work up to solid foods.   Please notify your doctor immediately if you have any unusual bleeding, trouble breathing, redness and pain at the surgery site, drainage, fever, or pain not relieved by medication.    Additional Instructions:        Please contact your physician with any problems or Same Day Surgery at [phone], Monday  through Friday 6 am to 4 pm, or [place] [facility] [place] Main number at [phone].  

## 2022-03-18 NOTE — Assessment & Plan Note (Addendum)
Patient noted to have chronic microcytic anemia and complains of passage of intermittent melena stools ?She has had a 2 g drop in her H&H over the last 1 year 12.6 >> 10.6 ?Monitor H&H closely ?Patient is status post colonoscopy which was done 02/21 which showed diverticulosis and benign colonic neoplasm. ?Follow-up with GI as an outpatient ?

## 2022-03-18 NOTE — Assessment & Plan Note (Addendum)
Hypoxia of unclear etiology ?Patient noted to have room air pulse oximetry in the 70s and 80s post op ?She complains of dyspnea with exertion but at rest remains asymptomatic. ?Required home oxygen supplementation transiently after treatment for COVID-pneumonia in 2020. ?She is currently on 2 L of oxygen with improvement in her pulse oximetry to greater than 92%  ?Place patient on incentive spirometry q hour while awake ?PRN bronchodilator therapy ?We will obtain CT angiogram of the chest to rule out PE ?Obtain 2D echocardiogram to assess LVEF ? ?

## 2022-03-18 NOTE — Progress Notes (Signed)
Pt sats on RA in 70's - 80's with great pleth. Changed out cables to test equipment.  Instructed on IS, up to recliner chair, Dr. Wynetta Emery at bedside.  Duoneb done. RA sats afterwards 93%.  Pt smiling and states her breathing feels normal and she can tolerate her abdominal discomfort as is to be discharged and recover at home. ?

## 2022-03-18 NOTE — Anesthesia Procedure Notes (Signed)
Procedure Name: Intubation ?Date/Time: 03/18/2022 7:47 AM ?Performed by: Kerri Perches, CRNA ?Pre-anesthesia Checklist: Patient identified, Patient being monitored, Timeout performed, Emergency Drugs available and Suction available ?Patient Re-evaluated:Patient Re-evaluated prior to induction ?Oxygen Delivery Method: Circle system utilized ?Preoxygenation: Pre-oxygenation with 100% oxygen ?Induction Type: IV induction ?Ventilation: Mask ventilation without difficulty ?Laryngoscope Size: 3 and McGraph ?Grade View: Grade II ?Tube type: Oral ?Tube size: 7.0 mm ?Number of attempts: 1 ?Airway Equipment and Method: Stylet ?Placement Confirmation: ETT inserted through vocal cords under direct vision, positive ETCO2 and breath sounds checked- equal and bilateral ?Secured at: 19 cm ?Tube secured with: Tape ?Dental Injury: Teeth and Oropharynx as per pre-operative assessment  ? ? ? ? ?

## 2022-03-18 NOTE — Interval H&P Note (Signed)
History and Physical Interval Note: ? ?03/18/2022 ?7:05 AM ? 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  The various methods of treatment have been discussed with the patient and family. After consideration of risks, benefits and other options for treatment, the patient has consented to  Procedure(s): ?XI ROBOTIC [place] (N/A) as a surgical intervention.  The patient's history has been reviewed, patient examined, no change in status, stable for surgery.  I have reviewed the patient's chart and labs.  Questions were answered to the patient's satisfaction.   ? ? ?Asta Corbridge Cintron-Diaz ? ? ?

## 2022-03-18 NOTE — Assessment & Plan Note (Addendum)
Patient has a history of insulin-dependent diabetes mellitus ?Continue long-acting insulin ?Glycemic control with sliding scale ?Maintain consistent carbohydrate diet ?Hold oral hypoglycemic agents ?

## 2022-03-18 NOTE — Consult Note (Signed)
? ?NAME:  Emma White, MRN:  [id number], DOB:  February 16, 1956, LOS: 0 ?ADMISSION DATE:  03/18/2022, CONSULTATION DATE:  03/18/22 ?REFERRING MD:  Agbata, CHIEF COMPLAINT:  Hypoxia  ? ?History of Present Illness:  ?66yF with DM, anxiety, depression, HTN, obesity, covid-19 infection in 2020 who is admitted s/p robotic lap assisted appendectomy. She was found to be hypoxic on room air (saturation 70s-80s) post procedure, placed on O2 per [place] 2L with improvement in saturation.   ? ?Even before hospitalization she says she has had some DOE but it is worse post surgery today. She says after she had covid-19 she had an albuterol inhaler that did help with her DOE. Now she is using incentive spirometer incorrectly and infrequently. She says she snores quite loudly, feels very sleepy during the day typically. Last sleep study was many years ago. ? ?Never smoker, no vaping, no MJ. ? ?Pertinent  Medical History  ?DM ?HTN ?Obesity ?Covid-19 infection in 2020 requiring supplemental O2 at discharge  ? ?Significant Hospital Events: ?Including procedures, antibiotic start and stop dates in addition to other pertinent events   ?4/3 robotic lap assisted appendectomy ? ?Interim History / Subjective:  ? ? ?Objective   ?Blood pressure 118/67, pulse 80, temperature 97.6 ?F (36.4 ?C), resp. rate 19, height 5' (1.524 m), weight 70.3 kg, SpO2 92 %. ?   ?   ? ?Intake/Output Summary (Last 24 hours) at 03/18/2022 1721 ?Last data filed at 03/18/2022 1355 ?Gross per 24 hour  ?Intake 1900 ml  ?Output 435 ml  ?Net 1465 ml  ? ?Filed Weights  ? 03/18/22 0620  ?Weight: 70.3 kg  ? ? ?Examination: ?General appearance: 66 y.o., female, NAD, conversant  ?Eyes: anicteric sclerae; PERRL, tracking appropriately ?HENT: NCAT; MMM ?Neck: Trachea midline; no lymphadenopathy, no JVD ?Lungs: diminished bl, no crackles, no wheeze, with normal respiratory effort ?CV: RRR, no murmur  ?Abdomen: Soft, appropriately ttp; non-distended, BS hypoactive ?Extremities: No peripheral  edema, warm ?Skin: Normal turgor and texture; no rash ?Psych: Appropriate affect ?Neuro: Alert and oriented to person and place, no focal deficit  ? ? ?Resolved Hospital Problem list   ? ? ?Assessment & Plan:  ? ?# Acute hypoxic respiratory insufficiency: ?Likely main contributors to hypoxia are post-op atelectasis and undiagnosed sleep-disordered breathing. Her mosaicism could reflect either variant of normal, small airways disease from covid-19 or asthma, or obesity.  ?- frequent use of IS, instructed in use ?- early mobility  ?- primary team has ordered TTE ?- early DVT chemoppx when cleared for it by surgery ?- trial of bronchodilators while she's here is fine - I've ordered duonebs q6 while awake. Can discharge her with albuterol prn. I'd like to avoid steroid-containing inhaler unless truly needed to avoid masking asthma/COPD diagnosis with PFTs in clinic. ?- walking oximetry at discharge ?- I will arrange pulmonary follow up for PFTs and home sleep study to be done on outpatient basis ? ?Best Practice (right click and "Reselect all SmartList Selections" daily)  ? ?Per primary ? ?Labs   ?CBC: ?Recent Labs  ?Lab 03/18/22 ?1751  ?HGB 12.9  ?HCT 38.0  ? ? ?Basic Metabolic Panel: ?Recent Labs  ?Lab 03/18/22 ?0258  ?NA 137  ?K 4.4  ?CL 101  ?GLUCOSE 258*  ?BUN 29*  ?CREATININE 0.80  ? ?GFR: ?Estimated Creatinine Clearance: 61.3 mL/min (by C-G formula based on SCr of 0.8 mg/dL). ?No results for input(s): PROCALCITON, WBC, LATICACIDVEN in the last 168 hours. ? ?Liver Function Tests: ?No results for input(s): AST, ALT,  ALKPHOS, BILITOT, PROT, ALBUMIN in the last 168 hours. ?No results for input(s): LIPASE, AMYLASE in the last 168 hours. ?No results for input(s): AMMONIA in the last 168 hours. ? ?ABG ?   ?Component Value Date/Time  ? PHART 7.31 (L) 03/18/2022 1032  ? PCO2ART 46 03/18/2022 1032  ? PO2ART 51 (L) 03/18/2022 1032  ? HCO3 23.2 03/18/2022 1032  ? TCO2 23 03/18/2022 0649  ? ACIDBASEDEF 3.1 (H) 03/18/2022 1032   ? O2SAT 81 03/18/2022 1032  ?  ? ?Coagulation Profile: ?No results for input(s): INR, PROTIME in the last 168 hours. ? ?Cardiac Enzymes: ?No results for input(s): CKTOTAL, CKMB, CKMBINDEX, TROPONINI in the last 168 hours. ? ?HbA1C: ?No results found for: HGBA1C ? ?CBG: ?Recent Labs  ?Lab 03/18/22 ?0618 03/18/22 ?6720 03/18/22 ?1224 03/18/22 ?1636 03/18/22 ?1711  ?GLUCAP 230* 267* 250* 238* 331*  ? ? ?Review of Systems:   ?12 point review of systems is negative except as in HPI ? ?Past Medical History:  ?She,  has a past medical history of Anxiety, Arthritis, Complication of anesthesia (03/18/2022), Depression, Diabetes mellitus without complication ([place]), Dyspnea, Hyperlipemia, Hypertension, and Neuropathy.  ? ?Surgical History:  ? ?Past Surgical History:  ?Procedure Laterality Date  ? ABDOMINAL HYSTERECTOMY  1995  ? CATARACT EXTRACTION W/PHACO Left 09/25/2017  ? Procedure: CATARACT EXTRACTION PHACO AND INTRAOCULAR LENS PLACEMENT (IOC);  Surgeon: Eulogio Bear, MD;  Location: [facility] ORS;  Service: Ophthalmology;  Laterality: Left;   ?flui pack lot # [id number] H ? [place] 00:32.7 ?AP%   12.17 ?CDE   3.95  ? CATARACT EXTRACTION W/PHACO Right 10/16/2017  ? Procedure: CATARACT EXTRACTION PHACO AND INTRAOCULAR LENS PLACEMENT (IOC);  Surgeon: Eulogio Bear, MD;  Location: [facility] ORS;  Service: Ophthalmology;  Laterality: Right;  [place] 00:32.8 ?AP% 9.0 ?CDE 2.96 ?FLUID PACK LOT # [id number]  ? COLONOSCOPY WITH PROPOFOL N/A 05/27/2016  ? Procedure: COLONOSCOPY WITH PROPOFOL;  Surgeon: Manya Silvas, MD;  Location: [facility] ENDOSCOPY;  Service: Endoscopy;  Laterality: N/A;  ? COLONOSCOPY WITH PROPOFOL N/A 07/10/2021  ? Procedure: COLONOSCOPY WITH PROPOFOL;  Surgeon: Lesly Rubenstein, MD;  Location: [facility] ENDOSCOPY;  Service: Endoscopy;  Laterality: N/A;  ? COLONOSCOPY WITH PROPOFOL N/A 10/30/2021  ? Procedure: COLONOSCOPY WITH PROPOFOL;  Surgeon: Lesly Rubenstein, MD;  Location: [facility] ENDOSCOPY;  Service: Endoscopy;   Laterality: N/A;  ? COLONOSCOPY WITH PROPOFOL N/A 02/05/2022  ? Procedure: COLONOSCOPY WITH PROPOFOL;  Surgeon: Lesly Rubenstein, MD;  Location: [facility] ENDOSCOPY;  Service: Endoscopy;  Laterality: N/A;  ? ESOPHAGOGASTRODUODENOSCOPY (EGD) WITH PROPOFOL N/A 07/10/2021  ? Procedure: ESOPHAGOGASTRODUODENOSCOPY (EGD) WITH PROPOFOL;  Surgeon: Lesly Rubenstein, MD;  Location: [facility] ENDOSCOPY;  Service: Endoscopy;  Laterality: N/A;  IDDM  ? ESOPHAGOGASTRODUODENOSCOPY (EGD) WITH PROPOFOL N/A 10/30/2021  ? Procedure: ESOPHAGOGASTRODUODENOSCOPY (EGD) WITH PROPOFOL;  Surgeon: Lesly Rubenstein, MD;  Location: [facility] ENDOSCOPY;  Service: Endoscopy;  Laterality: N/A;  IDDM  ? EYE SURGERY Left 09/25/2017  ? cataract extraction  ? HYSTERECTOMY SUPRACERVICAL ABDOMINAL W/REMOVAL TUBES &/OR OVARIES  1995  ? KENALOG INJECTION Right 10/16/2017  ? Procedure: KENALOG INJECTION;  Surgeon: Eulogio Bear, MD;  Location: [facility] ORS;  Service: Ophthalmology;  Laterality: Right;  ? KNEE ARTHROCENTESIS Left 1981  ? KNEE ARTHROCENTESIS Bilateral 1992  ? right one in 1992  ? KNEE ARTHROCENTESIS Bilateral   ?  ? ?Social History:  ? reports that she has never smoked. She has never used smokeless tobacco. She reports that she does not drink alcohol and does not use  drugs.  ? ?Family History:  ?Her family history is negative for Breast cancer.  ? ?Allergies ?No Known Allergies  ? ?Home Medications  ?Prior to Admission medications   ?Medication Sig Start Date End Date Taking? Authorizing Provider  ?acetaminophen (TYLENOL) 650 MG CR tablet Take 650 mg by mouth 2 (two) times daily.   Yes [provider]  ?amLODipine (NORVASC) 10 MG tablet Take 10 mg by mouth daily.   Yes [provider]  ?aspirin 81 MG chewable tablet Chew 81 mg by mouth daily.   Yes [provider]  ?benzonatate (TESSALON) 100 MG capsule Take 100 mg by mouth 3 (three) times daily as needed for cough.   Yes [provider]  ?citalopram (CELEXA)  20 MG tablet Take 20 mg by mouth daily. With dinner   Yes [provider]  ?gabapentin (NEURONTIN) 800 MG tablet Take 800 mg by mouth 3 (three) times daily.   Yes [provider]  ?HYDROcodone-ac

## 2022-03-18 NOTE — Op Note (Signed)
Pre-op Diagnosis: Mass of appendix  ? ?Post op Diagnosis: Mass of appendix ? ?Procedure: Robotic assisted laparoscopic appendectomy. ? ?Anesthesia: GETA ? ?Surgeon: Herbert Pun, MD, FACS ? ?Wound Classification: clean contaminated ? ?Specimen: Appendix ? ?Complications: None ? ?Estimated Blood Loss: 3 mL ? ? ?Indications: Patient is a 66 y.o. female  presented with a large polyp at the base of the appendix unable to be resected endoscopically. Polyp was adenomatous.   ? ?FIndings: ?1.  Non dilated appendix ?2. Normal anatomy ?3. Adequate hemostasis.  ? ?Description of procedure: The patient was placed on the operating table in the supine position. General anesthesia was induced. A time-out was completed verifying correct patient, procedure, site, positioning, and implant(s) and/or special equipment prior to beginning this procedure. The abdomen was prepped and draped in the usual sterile fashion.  ? ?Palmer's point located and Veress needle was inserted.  After confirming 2 clicks and a positive saline drop test, gas insufflation was initiated until the abdominal pressure was measured at 15 mmHg.  Afterwards, the Veress needle was removed and a 8 mm port was placed in left upper quadrant area using Optiview technique.  After local was infused, 3 additional incision on the left abdominal wall were made 5 cm apart.  An 12 mm port and two other 8 mm ports were placed under direct visualization.  No injuries from trocar placements were noted.  The table was placed in the Trendelenburg position with the right side elevated.  With the use of Tip up grasper, Fenestrated Bipolar and Scissors, a normal appendix was identified and elevated.  Window created at base of appendix in the mesentery.   ? ?An  blue load linear cutting stapler was then used to divide and staple the base of the appendix. Mesoappendix was divided with bipolar energy and scissors. The appendix was placed in an endoscopic retrieval bag and  removed.  ? ?The appendiceal stump was examined and hemostasis noted. No other pathology was identified within pelvis. Vistaseal irrigated at the base of the cecum. The 12 mm trocar removed and port site closed with PMI using 0 vicryl under direct vision. Remaining trocars were removed under direct vision. No bleeding was noted.The abdomen was allowed to collapse.  All skin incisions then closed with subcuticular sutures Monocryl 4-0.  Wounds then dressed with dermabond. ? ?The patient tolerated the procedure well, awakened from anesthesia and was taken to the postanesthesia care unit in satisfactory condition.  Sponge count and instrument count correct at the end of the procedure. ? ?

## 2022-03-18 NOTE — Assessment & Plan Note (Signed)
Stable ?Continue citalopram ?

## 2022-03-18 NOTE — Transfer of Care (Addendum)
Immediate Anesthesia Transfer of Care Note ? ?Patient: Emma White ? ?Procedure(s) Performed: XI ROBOTIC LAPAROSCOPIC ASSISTED APPENDECTOMY (Abdomen) ? ?Patient Location: PACU ? ?Anesthesia Type:General ? ?Level of Consciousness: drowsy ? ?Airway & Oxygen Therapy: Patient Spontanous Breathing ? ?Post-op Assessment: Report given to RN ? ?Post vital signs: stable ? ?Last Vitals:  ?Vitals Value Taken Time  ?BP    ?Temp    ?Pulse    ?Resp    ?SpO2    ? ? ?Last Pain:  ?Vitals:  ? 03/18/22 0620  ?TempSrc: Oral  ?PainSc: 0-No pain  ?   ? ?  ? ?Complications: No notable events documented. ?

## 2022-03-19 ENCOUNTER — Telehealth: Payer: Self-pay

## 2022-03-19 ENCOUNTER — Other Ambulatory Visit: Payer: Medicare HMO

## 2022-03-19 ENCOUNTER — Encounter: Payer: Self-pay | Admitting: General Surgery

## 2022-03-19 ENCOUNTER — Observation Stay
Admission: RE | Admit: 2022-03-19 | Discharge: 2022-03-19 | Disposition: A | Discharge status: Home / Self Care | Payer: Medicare HMO | Source: Home / Self Care | Attending: Internal Medicine | Admitting: Internal Medicine

## 2022-03-19 DIAGNOSIS — R0902 Hypoxemia: Secondary | ICD-10-CM | POA: Diagnosis not present

## 2022-03-19 DIAGNOSIS — D121 Benign neoplasm of appendix: Secondary | ICD-10-CM | POA: Diagnosis not present

## 2022-03-19 DIAGNOSIS — Z9049 Acquired absence of other specified parts of digestive tract: Secondary | ICD-10-CM | POA: Diagnosis not present

## 2022-03-19 LAB — HEMOGLOBIN A1C
Hgb A1c MFr Bld: 9.5 % — ABNORMAL HIGH (ref 4.8–5.6)
Mean Plasma Glucose: 226 mg/dL

## 2022-03-19 LAB — CBC
HCT: 32.4 % — ABNORMAL LOW (ref 36.0–46.0)
Hemoglobin: 10.1 g/dL — ABNORMAL LOW (ref 12.0–15.0)
MCH: 23.3 pg — ABNORMAL LOW (ref 26.0–34.0)
MCHC: 31.2 g/dL (ref 30.0–36.0)
MCV: 74.8 fL — ABNORMAL LOW (ref 80.0–100.0)
Platelets: 251 10*3/uL (ref 150–400)
RBC: 4.33 MIL/uL (ref 3.87–5.11)
RDW: 18.4 % — ABNORMAL HIGH (ref 11.5–15.5)
WBC: 6.6 10*3/uL (ref 4.0–10.5)
nRBC: 0 % (ref 0.0–0.2)

## 2022-03-19 LAB — BASIC METABOLIC PANEL
Anion gap: 7 (ref 5–15)
BUN: 14 mg/dL (ref 8–23)
CO2: 26 mmol/L (ref 22–32)
Calcium: 9 mg/dL (ref 8.9–10.3)
Chloride: 105 mmol/L (ref 98–111)
Creatinine, Ser: 0.54 mg/dL (ref 0.44–1.00)
GFR, Estimated: >60 mL/min (ref >60)
Glucose, Bld: 227 mg/dL — ABNORMAL HIGH (ref 70–99)
Potassium: 4 mmol/L (ref 3.5–5.1)
Sodium: 138 mmol/L (ref 135–145)

## 2022-03-19 LAB — GLUCOSE, CAPILLARY
Glucose-Capillary: 220 mg/dL — ABNORMAL HIGH (ref 70–99)
Glucose-Capillary: 315 mg/dL — ABNORMAL HIGH (ref 70–99)

## 2022-03-19 LAB — SURGICAL PATHOLOGY

## 2022-03-19 NOTE — Progress Notes (Signed)
*  PRELIMINARY RESULTS* ?Echocardiogram ?2D Echocardiogram has been performed. ? ?Aileena Iglesia, Sonia Side ?03/19/2022, 1:21 PM ?

## 2022-03-19 NOTE — Progress Notes (Incomplete)
{  Select_TRH_Note:[id number]} 

## 2022-03-19 NOTE — TOC Initial Note (Signed)
Transition of Care (TOC) - Initial/Assessment Note  ? ? ?Patient Details  ?Name: Emma White ?MRN: [id number] ?Date of Birth: 14-Feb-1956 ? ?Transition of Care (TOC) CM/SW Contact:    ?Laurena Slimmer, RN ?Phone Number: ?03/19/2022, 12:10 PM ? ?Clinical Narrative:     ?Spoke with patient in room. Husband at bedside. Husband will transport home. Advised of new set up for home oxygen and that oxygen would be delivered to room at discharge via [employer] [facility].  ?             ?Admitted for: Hypoxia ?Admitted from: Home  ?PCP: August Luz ?Pharmacy: [facility], [address] ?Current home health/prior home health/DME: No active services  ? ? ? ? ?Expected Discharge Plan: Home/Self Care ?Barriers to Discharge: Barriers Resolved ? ? ?Patient Goals and CMS Choice ?Patient states their goals for this hospitalization and ongoing recovery are:: To return to home ?  ?  ? ?Expected Discharge Plan and Services ?Expected Discharge Plan: Home/Self Care ?  ?Discharge Planning Services: CM Consult ?Post Acute Care Choice: Durable Medical Equipment ?Living arrangements for the past 2 months: [place] ?Expected Discharge Date: 03/19/22               ?DME Arranged: Oxygen ?DME Agency: [employer] ?Date DME Agency Contacted: 03/19/22 ?Time DME Agency Contacted: 1209 ?Representative spoke with at DME Agency: Suanne Marker ?  ?  ?  ?  ?  ? ?Prior Living Arrangements/Services ?Living arrangements for the past 2 months: [place] ?Lives with:: Spouse ?Patient language and need for interpreter reviewed:: Yes ?Do you feel safe going back to the place where you live?: Yes      ?Need for Family Participation in Patient Care: No (Comment) ?Care giver support system in place?: Yes (comment) ?  ?Criminal Activity/Legal Involvement Pertinent to Current Situation/Hospitalization: No - Comment as needed ? ?Activities of Daily Living ?Home Assistive Devices/Equipment: CBG Meter ?ADL Screening (condition at time of admission) ?Patient's cognitive  ability adequate to safely complete daily activities?: Yes ?Is the patient deaf or have difficulty hearing?: No ?Does the patient have difficulty seeing, even when wearing glasses/contacts?: No ?Does the patient have difficulty concentrating, remembering, or making decisions?: No ?Patient able to express need for assistance with ADLs?: Yes ?Does the patient have difficulty dressing or bathing?: No ?Independently performs ADLs?: Yes (appropriate for developmental age) ?Does the patient have difficulty walking or climbing stairs?: No ?Weakness of Legs: None ?Weakness of Arms/Hands: None ? ?Permission Sought/Granted ?  ?  ?   ?   ?   ?   ? ?Emotional Assessment ?Appearance:: Appears stated age ?Attitude/Demeanor/Rapport: Engaged ?Affect (typically observed): Accepting ?Orientation: : Oriented to Self, Oriented to Place, Oriented to  Time, Oriented to Situation ?Alcohol / Substance Use: Not Applicable ?Psych Involvement: No (comment) ? ?Admission diagnosis:  Hypoxia [R09.02] ?Patient Active Problem List  ? Diagnosis Date Noted  ? Hypoxia 03/18/2022  ? Hypertension   ? Diabetes mellitus without complication ([place])   ? Depression   ? Anemia   ? History of laparoscopic appendectomy   ? ?PCP:  Rutherford Limerick, PA ?Pharmacy:   ?[facility] [place] ?[address] ?[place] [address] ?Phone: [phone] Fax: [phone] ? ? ? ? ?Social Determinants of Health (SDOH) Interventions ?  ? ?Readmission Risk Interventions ?   ? View : No data to display.  ?  ?  ?  ? ? ? ?

## 2022-03-19 NOTE — Telephone Encounter (Signed)
Received below message from Donell Beers, NP via epic chat- ? ?Hi Emma White can you set this pt up to be seen in the office for PFT's and a sleep study she would be a new pt so she can see anyone.  Timeframe 2-4 weeks ? ? ?Patient currently admitted. Will call to schedule appt once discharged.  ? ?

## 2022-03-19 NOTE — Progress Notes (Signed)
Mobility Specialist - Progress Note ? ? 03/19/22 1100  ?Mobility  ?Activity Ambulated independently in hallway  ?Level of Assistance Independent  ?Assistive Device None  ?Distance Ambulated (ft) 160 ft  ?Activity Response Tolerated well  ?$Mobility charge 1 Mobility  ? ? ? ?O2 while resting on RA = 92%  ? ?O2 while AMB on RA = 87%  ? ?O2 while AMB on 1L = 94%  ? ? ?Pt resting in bed upon arrival, utilizing RA. Ambulated in hallway independently. Denied SOB and fatigue. Pt left semi-supine with needs in reach. Spouse at bedside. RN notified.  ? ? ?Kathee Delton ?Mobility Specialist ?03/19/22, 11:10 AM ? ? ? ? ?

## 2022-03-19 NOTE — Progress Notes (Signed)
Pt discharged per MD order. IV removed. Discharge instructions reviewed with pt. Pt verbalized understanding. All questions answered to pt satisfaction. Pt taken to car in wheelchair by staff.  ?

## 2022-03-19 NOTE — Discharge Summary (Signed)
?Physician Discharge Summary ?  ?Patient: Emma White MRN: [id number] DOB: [birth year]  ?Admit date:     03/18/2022  ?Discharge date: 03/19/2022  ?Discharge Physician: Jennye Boroughs  ? ?PCP: Rutherford Limerick, PA  ? ?Recommendations at discharge:  ? ? ?Follow-up with PCP in 1 week ?Follow-up with Dr. Lanney Gins, pulmonologist in 1 to 2 weeks ?Follow-up with Dr. Windell Moment, general surgeon, on 04/02/2022 as scheduled. ? ?Discharge Diagnoses: ?Principal Problem: ?  Hypoxia ?Active Problems: ?  Hypertension ?  Diabetes mellitus without complication ([place]) ?  Depression ?  Anemia ?  History of laparoscopic appendectomy ? ?Resolved Problems: ?  * No resolved hospital problems. * ? ?Hospital Course: ? ?Ms. Emma White is a 66 year old woman with medical history significant for diabetes mellitus, depression, anxiety, hypertension, history of COVID-19 infection in 2563 complicated by hypoxic respiratory failure that required oxygen at home.  She presented to the hospital for left knee surgery.  She underwent robotic assisted laparoscopic appendectomy on 03/18/2022.  She had been given fentanyl for pain during surgery and she developed acute hypoxic respiratory failure that was attributed to opioids.  The hospitalist team was consulted to assist with management. ? ?There was no evidence of acute lung disease such as pneumonia, pneumothorax, pleural effusion or pulmonary embolism.  However, there was evidence of possible small airway disease, bibasilar atelectasis and trace pneumoperitoneum, most likely postsurgical..  Her condition improved.  She was able to tolerate room air at rest.  Unfortunately, her oxygen saturation decreased to 87% with activity on room air.  She was deemed eligible for home oxygen for chronic hypoxic respiratory failure.  Outpatient follow-up with pulmonology was following, and for further evaluation.  Discharge plan was discussed with the patient and her husband at the bedside. ? ? ? ? ?  ? ?Pain control  - [employer] Controlled Substance Reporting System database was reviewed. and patient was instructed, not to drive, operate heavy machinery, perform activities at heights, swimming or participation in water activities or provide baby-sitting services while on Pain, Sleep and Anxiety Medications; until their outpatient Physician has advised to do so again. Also recommended to not to take more than prescribed Pain, Sleep and Anxiety Medications.  ?Consultants: Education officer, environmental, pulmonologist ?Procedures performed: Laparoscopic appendectomy ?Disposition: Home ?Diet recommendation:  ?Discharge Diet Orders (From admission, onward)  ? ?  Start     Ordered  ? 03/19/22 0000  Diet Carb Modified       ? 03/19/22 1147  ? 03/18/22 0000  Diet - low sodium heart healthy       ? 03/18/22 0858  ? 03/18/22 0000  Diet - low sodium heart healthy       ? 03/18/22 0909  ? ?  ?  ? ?  ? ?Cardiac and Carb modified diet ?DISCHARGE MEDICATION: ?Allergies as of 03/19/2022   ?No Known Allergies ?  ? ?  ?Medication List  ?  ? ?TAKE these medications   ? ?acetaminophen 650 MG CR tablet ?Commonly known as: TYLENOL ?Take 650 mg by mouth 2 (two) times daily. ?  ?amLODipine 10 MG tablet ?Commonly known as: NORVASC ?Take 10 mg by mouth daily. ?  ?aspirin 81 MG chewable tablet ?Chew 81 mg by mouth daily. ?  ?benzonatate 100 MG capsule ?Commonly known as: TESSALON ?Take 100 mg by mouth 3 (three) times daily as needed for cough. ?  ?citalopram 20 MG tablet ?Commonly known as: CELEXA ?Take 20 mg by mouth daily. With dinner ?  ?gabapentin 800  MG tablet ?Commonly known as: NEURONTIN ?Take 800 mg by mouth 3 (three) times daily. ?  ?HYDROcodone-acetaminophen 5-325 MG tablet ?Commonly known as: Norco ?Take 1 tablet by mouth every 4 (four) hours as needed for up to 3 days for moderate pain. ?  ?ibuprofen 200 MG tablet ?Commonly known as: ADVIL ?Take 400 mg by mouth every 6 (six) hours as needed for moderate pain. ?  ?insulin aspart 100 UNIT/ML  injection ?Commonly known as: novoLOG ?Inject 10 Units into the skin 3 (three) times daily with meals. ?  ?insulin detemir 100 UNIT/ML injection ?Commonly known as: LEVEMIR ?Inject 30 Units into the skin at bedtime. ?  ?metFORMIN 500 MG tablet ?Commonly known as: GLUCOPHAGE ?Take 500 mg by mouth 3 (three) times daily. ?  ?pantoprazole 40 MG tablet ?Commonly known as: PROTONIX ?Take 40 mg by mouth daily. ?  ?Potassium 99 MG Tabs ?Take 1 tablet by mouth daily. With breakfast ?  ?Potassium Gluconate 2.5 MEQ Tabs ?Take 2.5 mEq by mouth daily. ?  ?pravastatin 10 MG tablet ?Commonly known as: PRAVACHOL ?Take 10 mg by mouth daily. ?  ?sitaGLIPtin 100 MG tablet ?Commonly known as: JANUVIA ?Take 100 mg by mouth daily. ?  ?Vitamin D3 125 MCG (5000 UT) Caps ?Take by mouth. ?  ? ?  ? ?  ?  ? ? ?  ?Durable Medical Equipment  ?(From admission, onward)  ?  ? ? ?  ? ?  Start     Ordered  ? 03/19/22 1147  DME Oxygen  Once       ?Question Answer Comment  ?Length of Need Lifetime   ?Mode or (Route) Nasal cannula   ?Liters per Minute 1   ?Frequency Continuous (stationary and portable oxygen unit needed)   ?Oxygen delivery system Gas   ?  ? 03/19/22 1147  ? ?  ?  ? ?  ? ? Follow-up Information   ? ? Herbert Pun, MD Follow up on 04/02/2022.   ?Specialty: General Surgery ?Why: @ 8:45 am ?Contact information: ?[place] ?[place] [address] ?[phone] ? ? ?  ?  ? ? Ottie Glazier, MD. Schedule an appointment as soon as possible for a visit in 1 week(s).   ?Specialty: Pulmonary Disease ?Contact information: ?[address] ?[place] [address] ?[phone] ? ? ?  ?  ? ?  ?  ? ?  ? ?Discharge Exam: ?Filed Weights  ? 03/18/22 5056  ?Weight: 70.3 kg  ? ?GEN: NAD ?SKIN: No rash ?EYES: EOMI ?ENT: MMM ?CV: RRR ?PULM: CTA B ?ABD: soft, obese, multiple small intact incisions, mild surgical tenderness, +BS ?CNS: AAO x 3, non focal ?EXT: No edema or tenderness ? ? ?Condition at discharge: good ? ?The results of  significant diagnostics from this hospitalization (including imaging, microbiology, ancillary and laboratory) are listed below for reference.  ? ?Imaging Studies: ?CT Angio Chest Pulmonary Embolism (PE) W or WO Contrast ? ?Result Date: 03/18/2022 ?CLINICAL DATA:  Low O2 sats EXAM: CT ANGIOGRAPHY CHEST WITH CONTRAST TECHNIQUE: Multidetector CT imaging of the chest was performed using the standard protocol during bolus administration of intravenous contrast. Multiplanar CT image reconstructions and MIPs were obtained to evaluate the vascular anatomy. RADIATION DOSE REDUCTION: This exam was performed according to the departmental dose-optimization program which includes automated exposure control, adjustment of the mA and/or kV according to patient size and/or use of iterative reconstruction technique. CONTRAST:  65m OMNIPAQUE IOHEXOL 350 MG/ML SOLN COMPARISON:  None. FINDINGS: Cardiovascular: Normal heart size. No  pericardial effusion. Mild atherosclerotic disease of the thoracic aorta. Adequate contrast opacification of the pulmonary arteries. Apparent filling defects of the subsegmental pulmonary arteries of the left lower lobe seen on series 6, image 149 and series 6, image 141, demonstrate linear configuration on multiplanar reformatted imaging ends and favored to be related to streak artifact. No evidence of pulmonary embolus. Mediastinum/Nodes: Patulous esophagus. Thyroid is unremarkable. No pathologically enlarged lymph nodes seen in the chest. Lungs/Pleura: Central airways are patent. Expiratory phase imaging with bilateral mosaic attenuation which is likely due to air trapping. Bibasilar atelectasis. No consolidation, pleural effusion or pneumothorax. Upper Abdomen: Hepatic steatosis. Trace pneumoperitoneum, likely postsurgical. Musculoskeletal: No chest wall abnormality. No acute or significant osseous findings. Review of the MIP images confirms the above findings. IMPRESSION: 1.  No evidence of pulmonary  embolus. 2. Trace pneumoperitoneum, likely postsurgical. 3. Expiratory phase imaging with bilateral mosaic attenuation which is likely due to air trapping, findings can be seen in the setting of small airways disease. 4. A

## 2022-03-20 LAB — ECHOCARDIOGRAM COMPLETE
AR max vel: 2.36 cm2
AV Area VTI: 2.2 cm2
AV Area mean vel: 2.24 cm2
AV Mean grad: 5 mmHg
AV Peak grad: 9.2 mmHg
Ao pk vel: 1.52 m/s
Area-P 1/2: 5.31 cm2
Height: 60 in
MV VTI: 1.97 cm2
S' Lateral: 2.9 cm
Weight: 2480 oz

## 2022-03-21 NOTE — Telephone Encounter (Signed)
Spoke to patient.  ?She stated that she is scheduled to see Dr. Lanney Gins. ?Will close encounter as nothing further is needed.  ? ?

## 2022-04-18 DIAGNOSIS — I5032 Chronic diastolic (congestive) heart failure: Secondary | ICD-10-CM | POA: Insufficient documentation

## 2022-05-09 ENCOUNTER — Other Ambulatory Visit: Payer: Self-pay | Admitting: Pulmonary Disease

## 2022-05-09 DIAGNOSIS — R0609 Other forms of dyspnea: Secondary | ICD-10-CM

## 2022-05-09 DIAGNOSIS — J849 Interstitial pulmonary disease, unspecified: Secondary | ICD-10-CM

## 2022-05-22 ENCOUNTER — Ambulatory Visit
Admission: RE | Admit: 2022-05-22 | Discharge: 2022-05-22 | Disposition: A | Discharge status: Home / Self Care | Payer: Medicare HMO | Source: Ambulatory Visit | Attending: Pulmonary Disease | Admitting: Pulmonary Disease

## 2022-05-22 DIAGNOSIS — J849 Interstitial pulmonary disease, unspecified: Secondary | ICD-10-CM

## 2022-05-22 DIAGNOSIS — R0609 Other forms of dyspnea: Secondary | ICD-10-CM

## 2022-05-29 DIAGNOSIS — G4733 Obstructive sleep apnea (adult) (pediatric): Secondary | ICD-10-CM | POA: Insufficient documentation

## 2022-09-10 DIAGNOSIS — J9611 Chronic respiratory failure with hypoxia: Secondary | ICD-10-CM | POA: Insufficient documentation

## 2022-11-26 ENCOUNTER — Other Ambulatory Visit: Payer: Self-pay | Admitting: Pulmonary Disease

## 2022-11-26 DIAGNOSIS — R911 Solitary pulmonary nodule: Secondary | ICD-10-CM

## 2022-12-08 IMAGING — DX DG CHEST 1V PORT
1 series · 1 of 1 positions shown · non-contrast
Comparison: No available priors at the time of dictation.

CLINICAL DATA: Postop.

EXAM:
PORTABLE CHEST 1 VIEW

[chest ap]
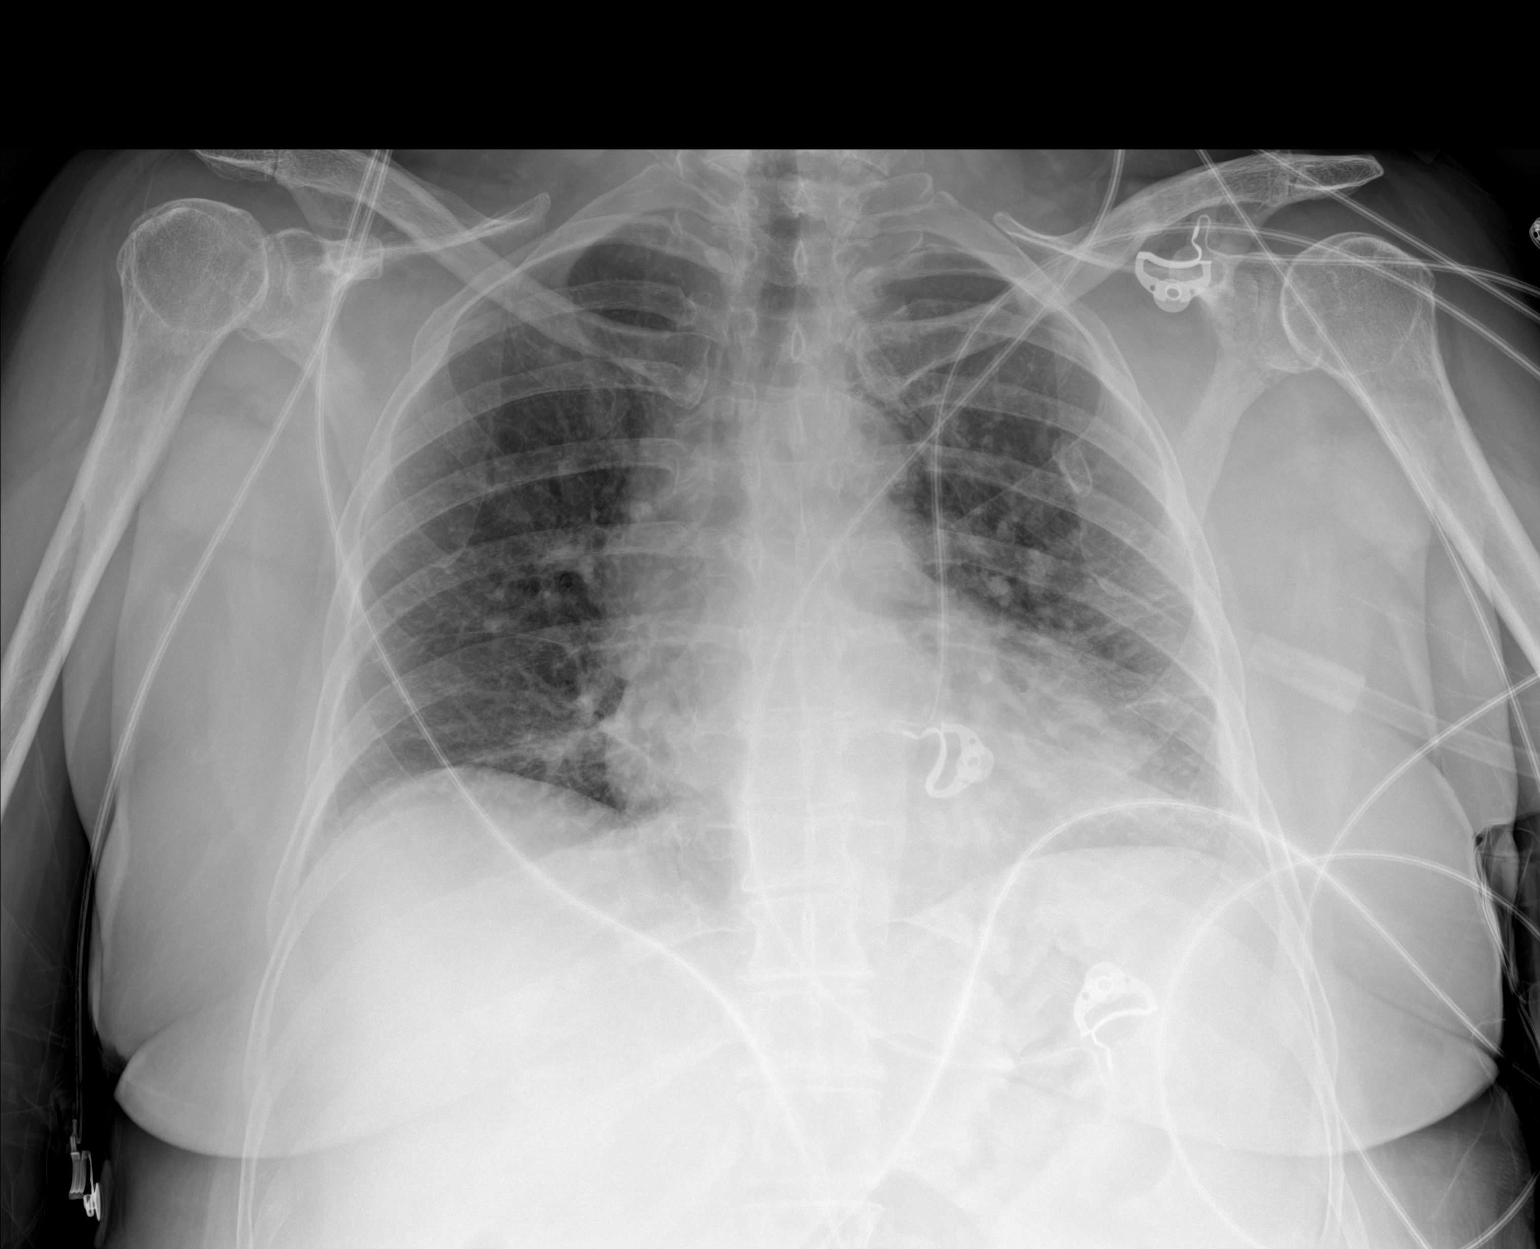

[1 of 1 positions shown; findings below may reference images not displayed]

FINDINGS: Trachea is midline. Heart size is accentuated by AP technique.
Streaky atelectasis in the lingula and lung bases. Lungs are
somewhat low in volume. No pleural fluid. Old left second rib
fracture.
IMPRESSION: Bibasilar streaky atelectasis.

## 2022-12-08 IMAGING — CT CT ANGIO CHEST
2 of 7 series · 18 of 46 positions shown · IV contrast (APPLIED)
Comparison: None.

CLINICAL DATA: Low O2 sats

EXAM:
CT ANGIOGRAPHY CHEST WITH CONTRAST
TECHNIQUE: Multidetector CT imaging of the chest was performed using the
standard protocol during bolus administration of intravenous
contrast. Multiplanar CT image reconstructions and MIPs were
obtained to evaluate the vascular anatomy.

[Series 6: thins · axial · 0.63mm/px · z∈[-290,-71]mm · 16 of 247 slices shown]
[im 14/247  lung]
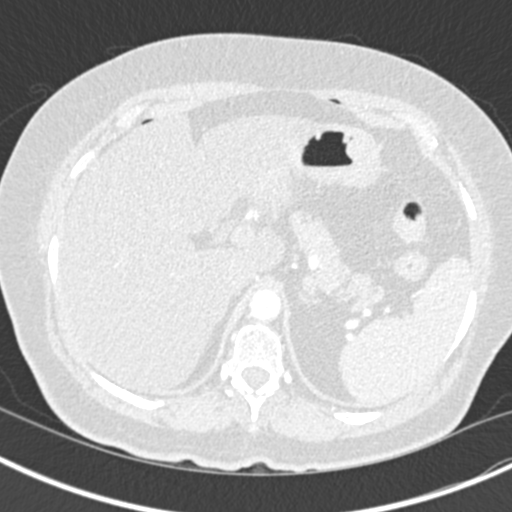
[im 28/247  soft-tissue]
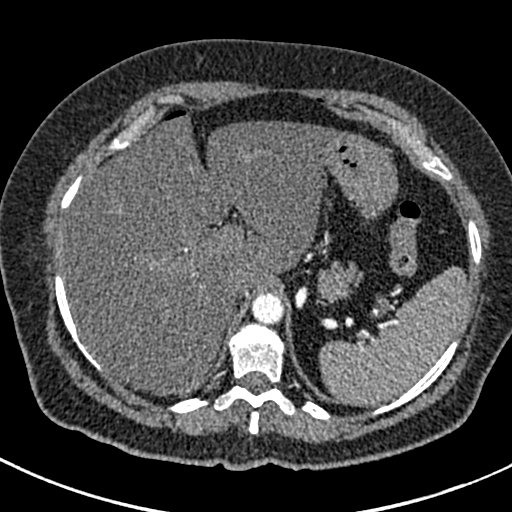
[im 42/247  lung]
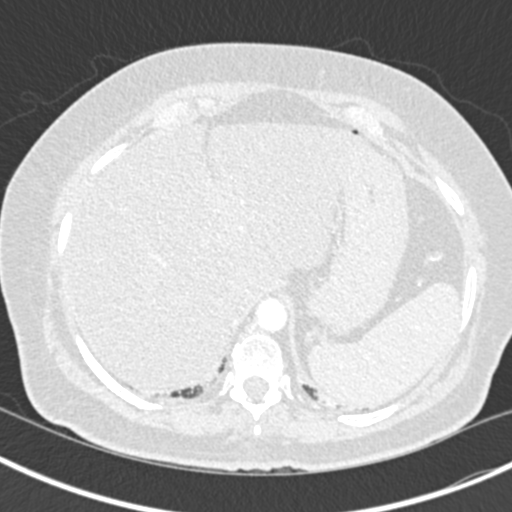
[im 55/247  soft-tissue]
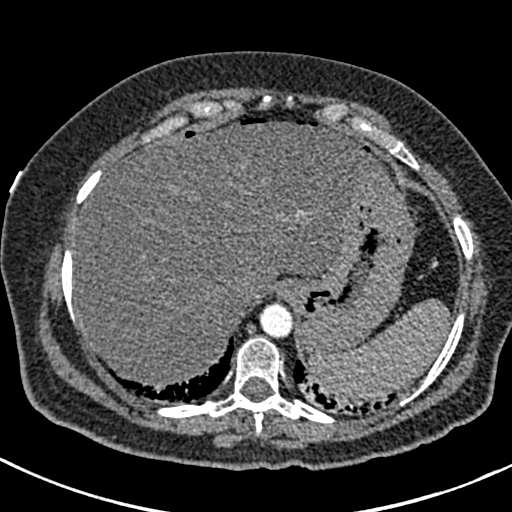
[im 69/247  lung]
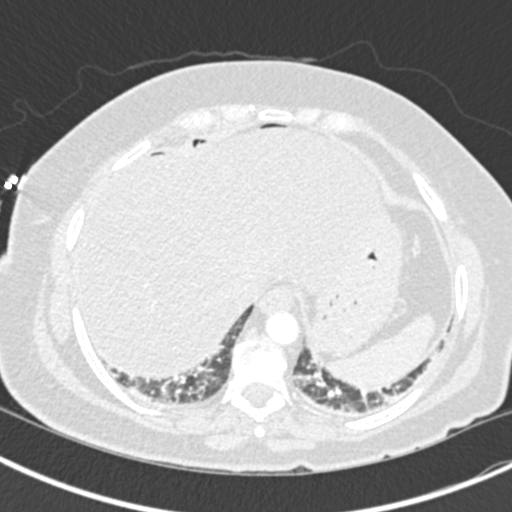
[im 83/247  soft-tissue]
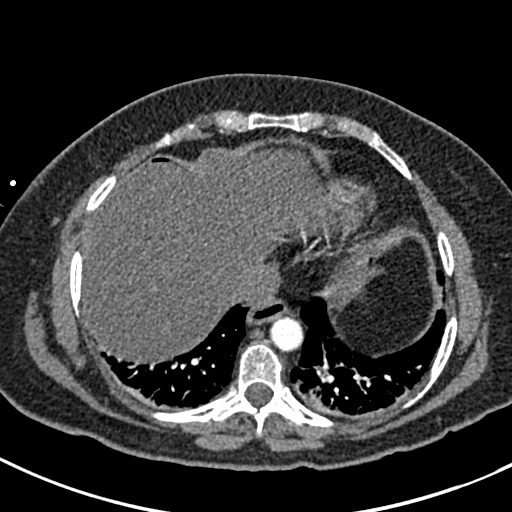
[im 96/247  lung]
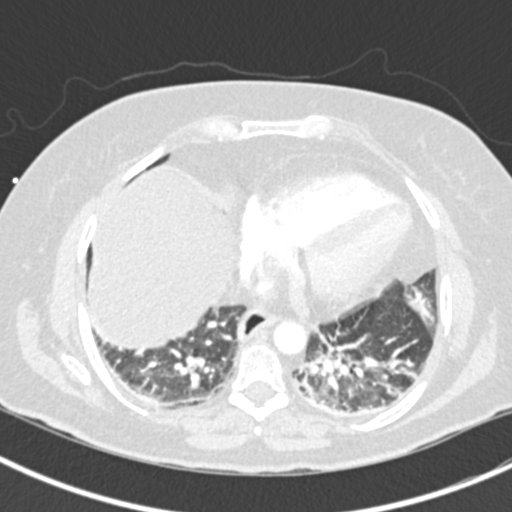
[im 110/247  soft-tissue]
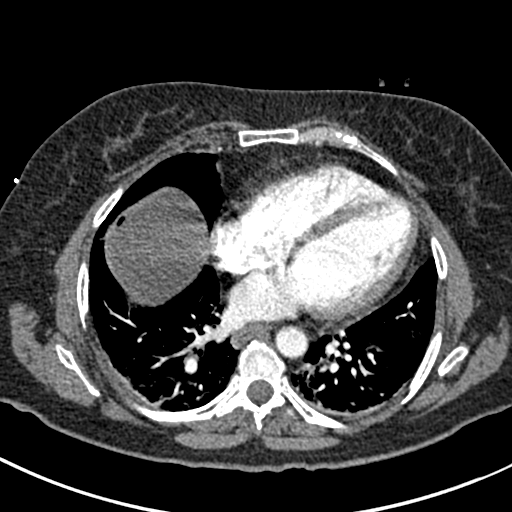
[im 137/247  lung]
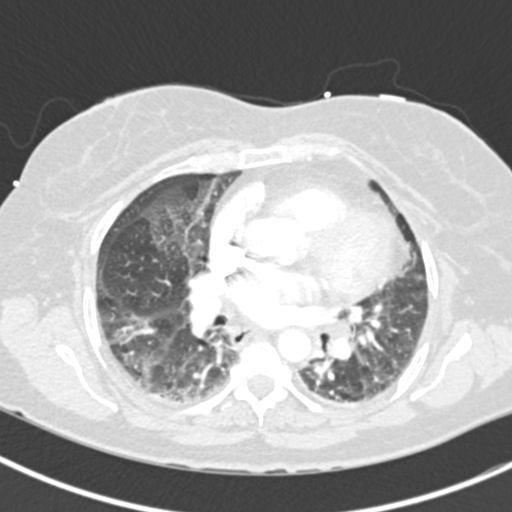
[im 151/247  soft-tissue]
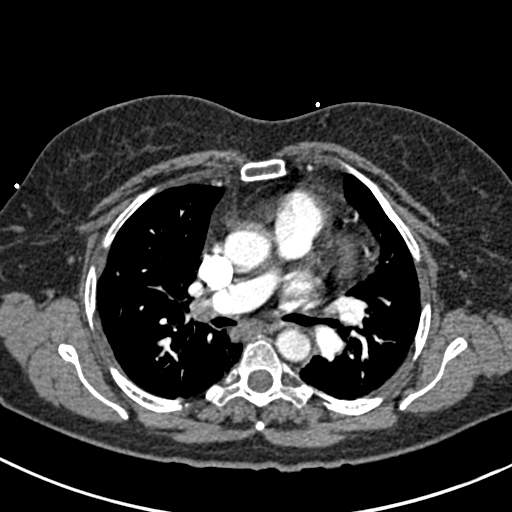
[im 165/247  lung]
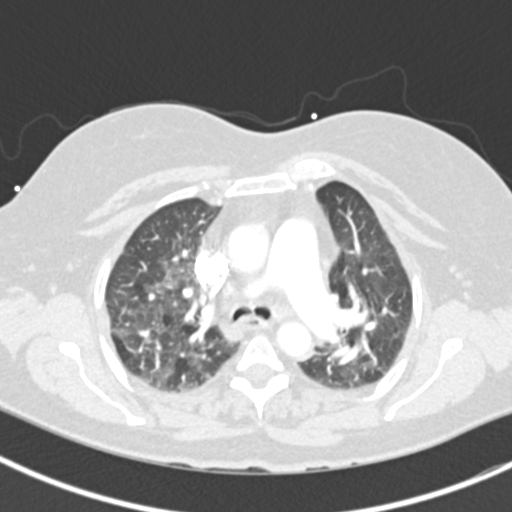
[im 178/247  soft-tissue]
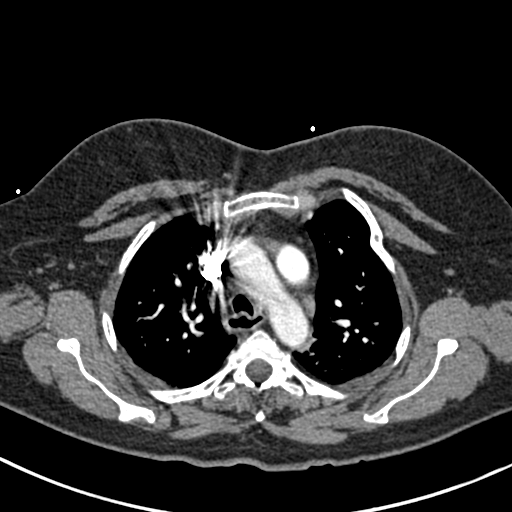
[im 192/247  lung]
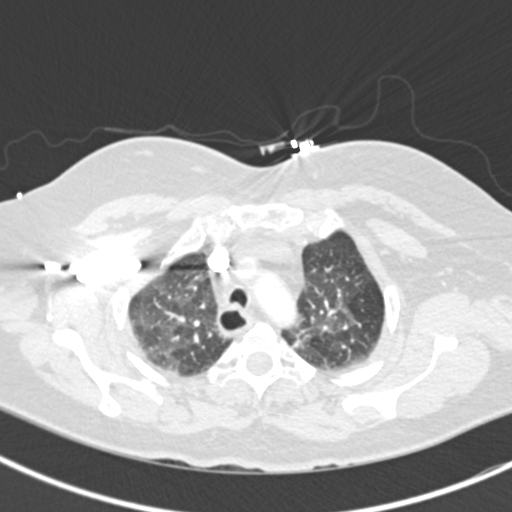
[im 206/247  soft-tissue]
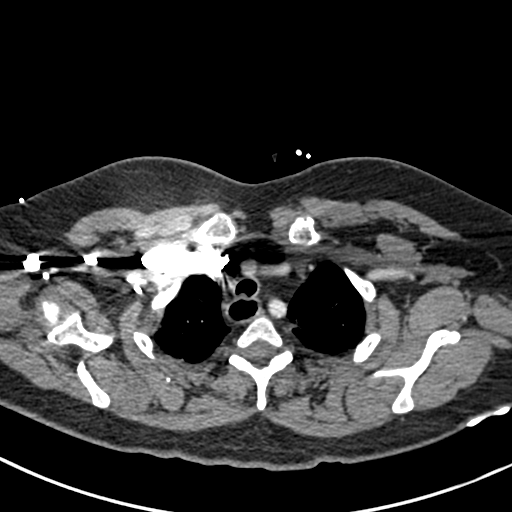
[im 219/247  lung]
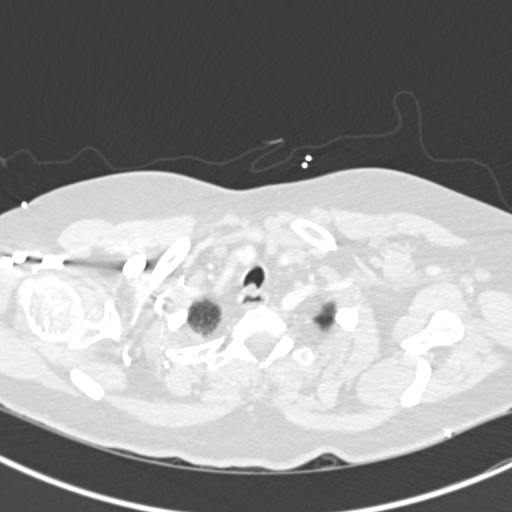
[im 233/247  soft-tissue]
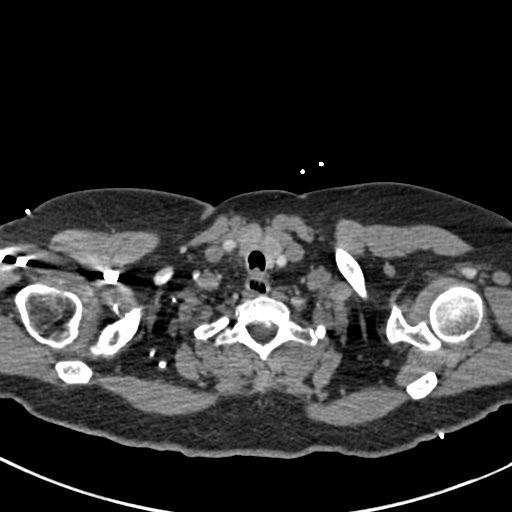

[Series 7: coronal mpr · coronal · 0.55mm/px · 2 of 87 slices shown]
[im 29/87  soft-tissue]
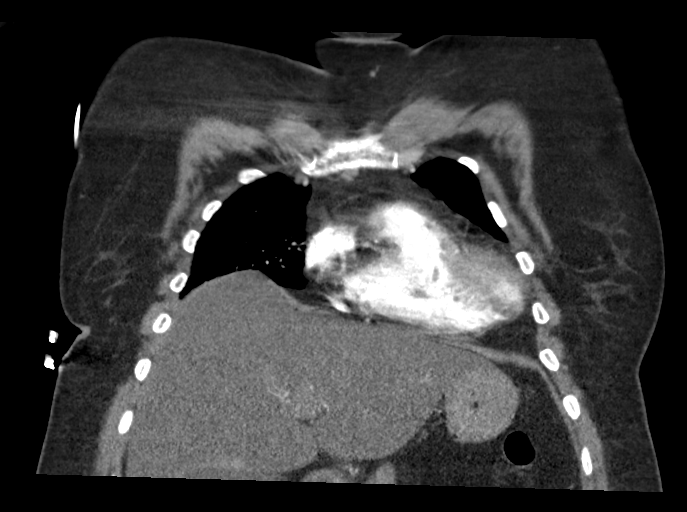
[im 58/87  soft-tissue]
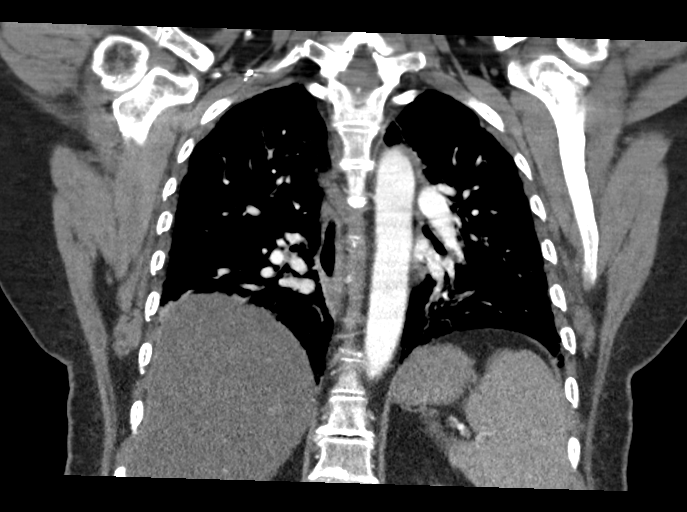

[18 of 46 positions shown; findings below may reference images not displayed]

RADIATION DOSE REDUCTION: This exam was performed according to the
departmental dose-optimization program which includes automated
exposure control, adjustment of the mA and/or kV according to
patient size and/or use of iterative reconstruction technique.

CONTRAST:  75mL OMNIPAQUE IOHEXOL 350 MG/ML SOLN
FINDINGS: Cardiovascular: Normal heart size. No pericardial effusion. Mild
atherosclerotic disease of the thoracic aorta. Adequate contrast
opacification of the pulmonary arteries. Apparent filling defects of
the subsegmental pulmonary arteries of the left lower lobe seen on
series 6, image 149 and series 6, image 141, demonstrate linear
configuration on multiplanar reformatted imaging ends and favored to
be related to streak artifact. No evidence of pulmonary embolus.

Mediastinum/Nodes: Patulous esophagus. Thyroid is unremarkable. No
pathologically enlarged lymph nodes seen in the chest.

Lungs/Pleura: Central airways are patent. Expiratory phase imaging
with bilateral mosaic attenuation which is likely due to air
trapping. Bibasilar atelectasis. No consolidation, pleural effusion
or pneumothorax.

Upper Abdomen: Hepatic steatosis. Trace pneumoperitoneum, likely
postsurgical.

Musculoskeletal: No chest wall abnormality. No acute or significant
osseous findings.

Review of the MIP images confirms the above findings.
IMPRESSION: 1.  No evidence of pulmonary embolus.
2. Trace pneumoperitoneum, likely postsurgical.
3. Expiratory phase imaging with bilateral mosaic attenuation which
is likely due to air trapping, findings can be seen in the setting
of small airways disease.
4. Aortic Atherosclerosis ([id number]).

## 2023-02-11 IMAGING — CT CT CHEST W/O CM
1 series · 15 of 34 positions shown, 19 images · non-contrast
Comparison: Chest CT dated 03/18/2022.

CLINICAL DATA: Follow-up chest CT for abnormal mosaic lung pattern.



[Series 3: chest w/o 2mm st · axial · non-contrast · 0.67mm/px · z∈[+960,+1214]mm · 15 of 151 slices shown, 19 images]
[im 12/151  mediastinal]
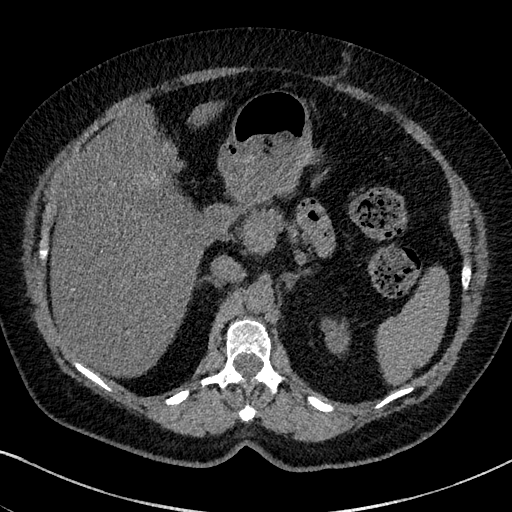
[im 12/151  lung]
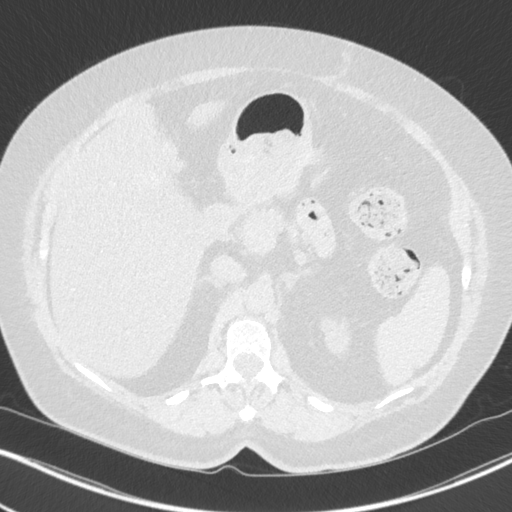
[im 23/151  lung]
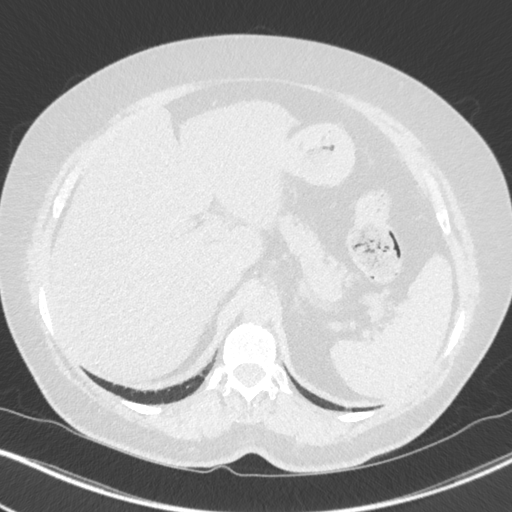
[im 31/151  lung]
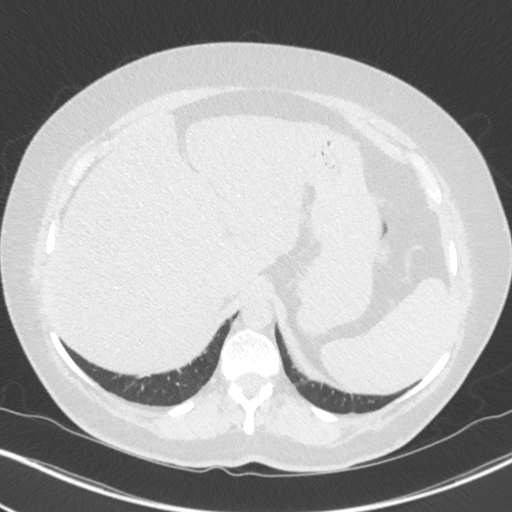
[im 39/151  lung]
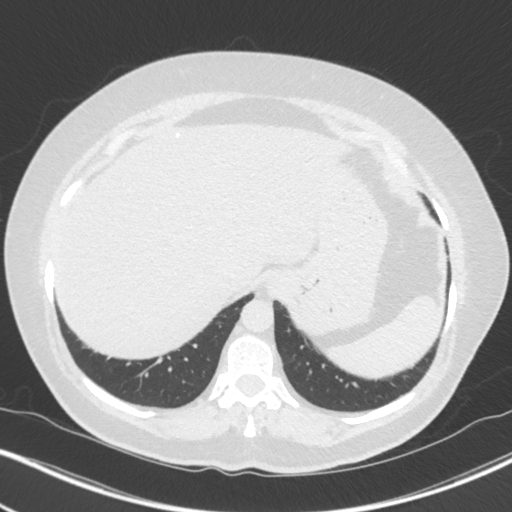
[im 51/151  mediastinal]
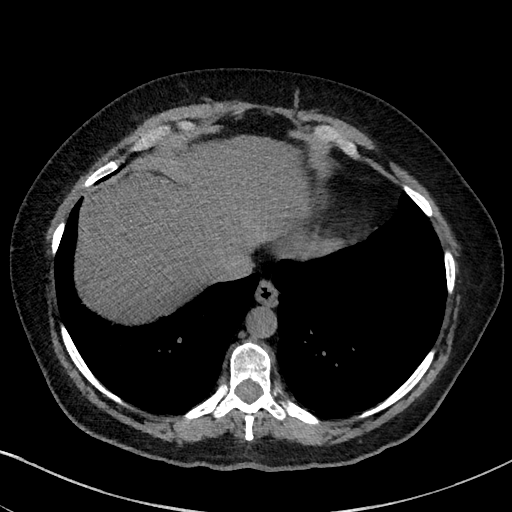
[im 51/151  lung]
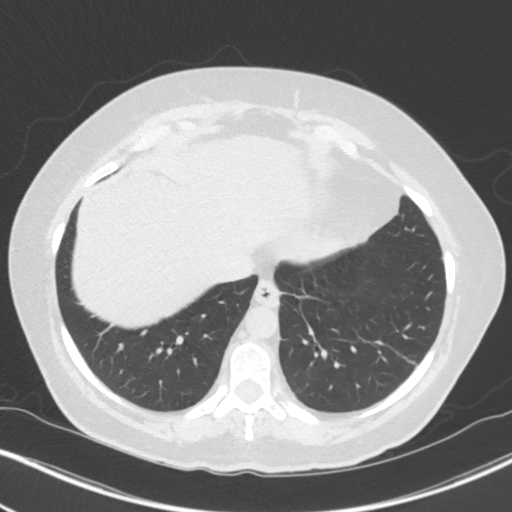
[im 61/151  lung]
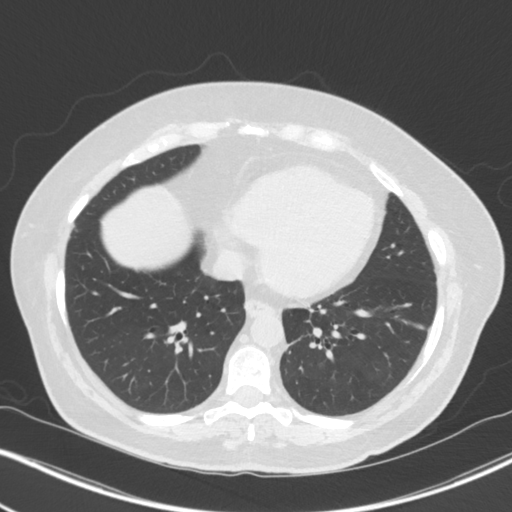
[im 67/151  lung]
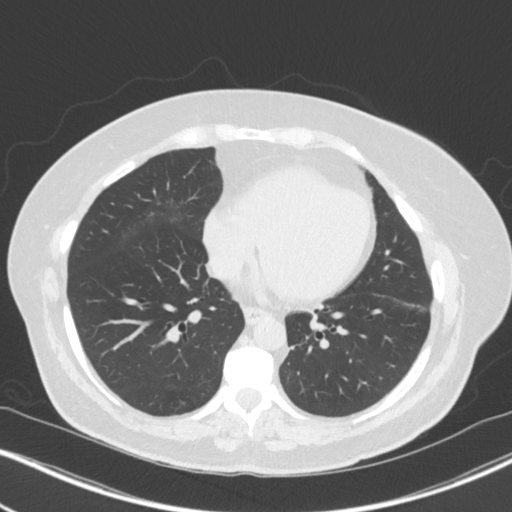
[im 78/151  lung]
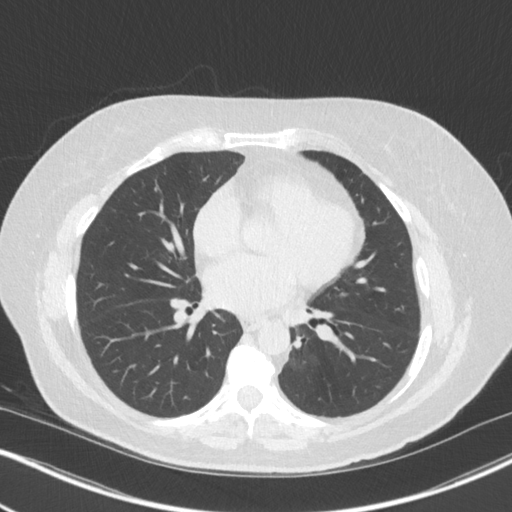
[im 84/151  mediastinal]
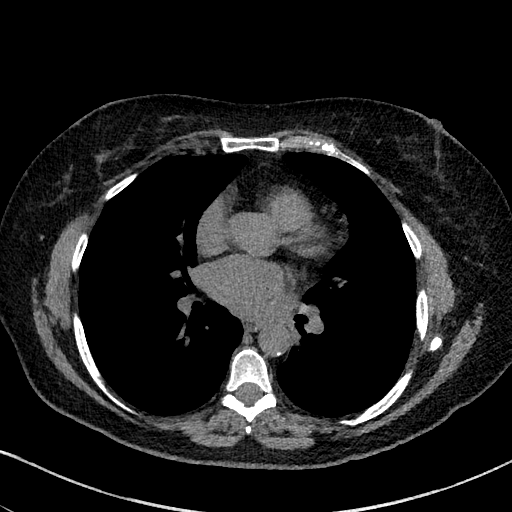
[im 84/151  lung]
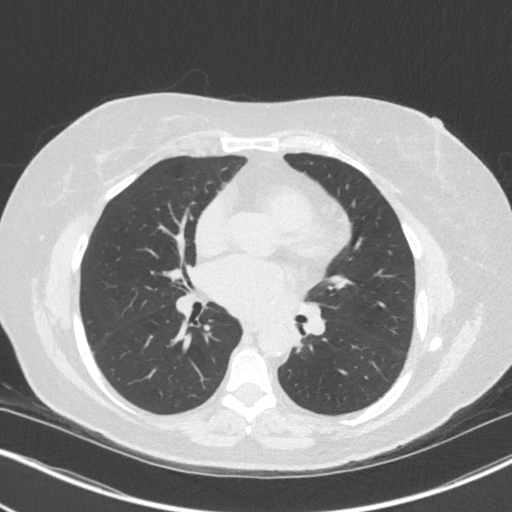
[im 91/151  lung]
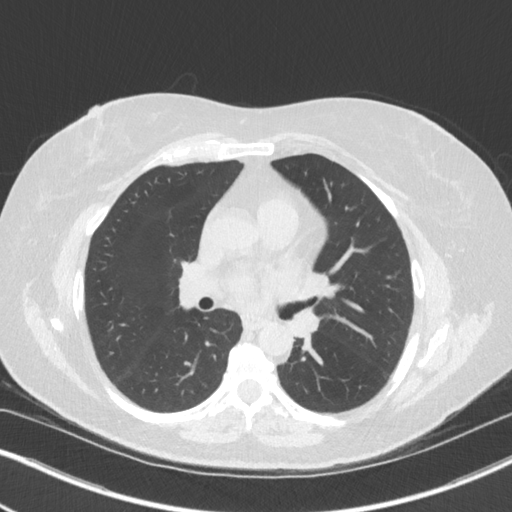
[im 101/151  lung]
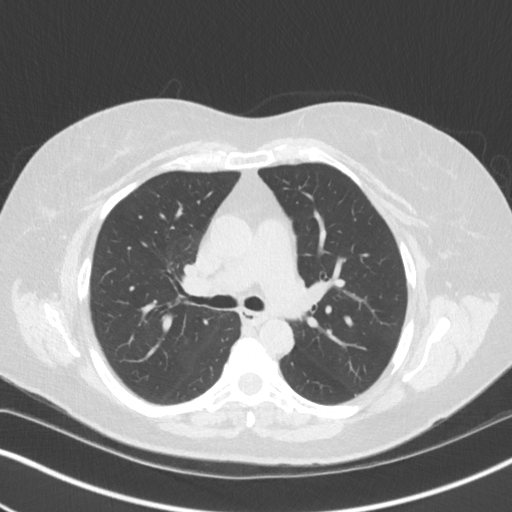
[im 112/151  lung]
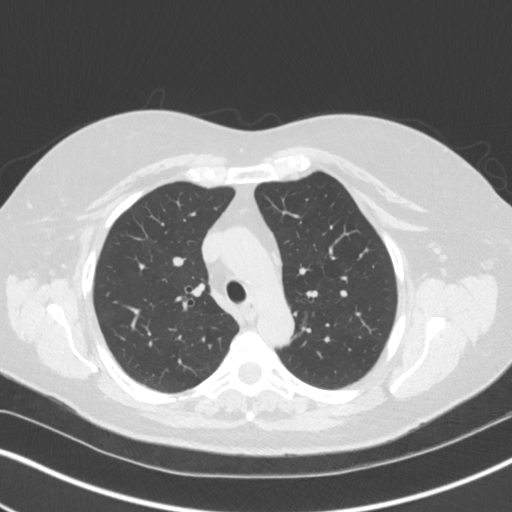
[im 121/151  mediastinal]
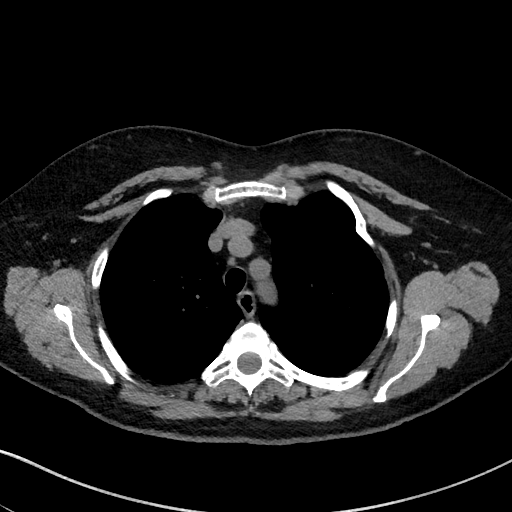
[im 121/151  lung]
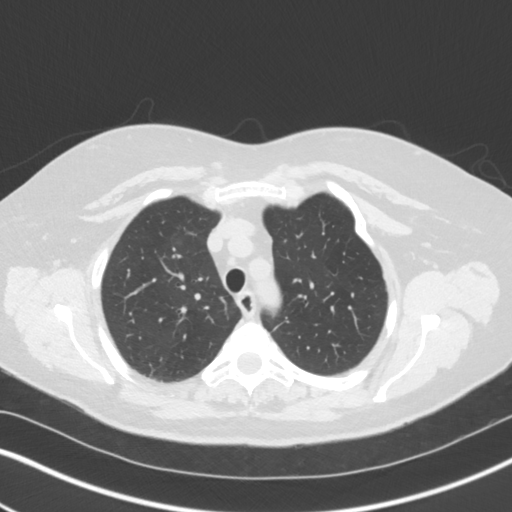
[im 128/151  lung]
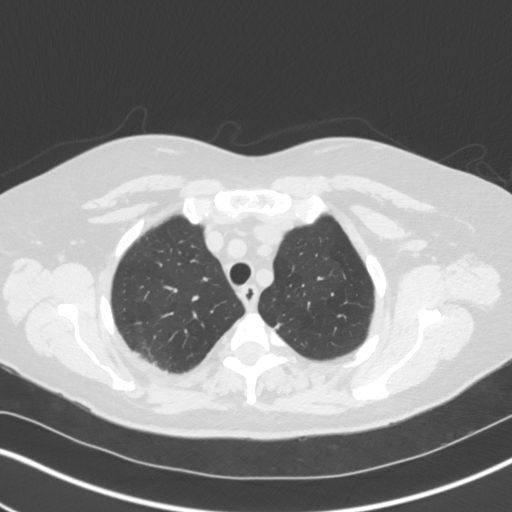
[im 139/151  lung]
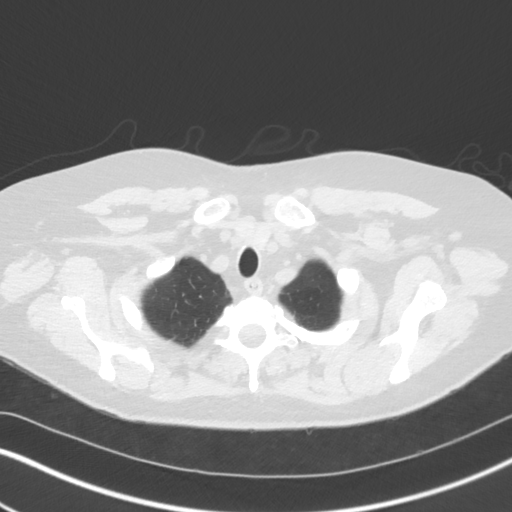

[15 of 34 positions shown; findings below may reference images not displayed]

FINDINGS: Evaluation of this exam is limited in the absence of intravenous
contrast.

Cardiovascular: There is no cardiomegaly or pericardial effusion.
Mild atherosclerotic calcification of the aortic arch. No aneurysmal
dilatation. The central pulmonary arteries are grossly unremarkable
on this noncontrast CT.

Mediastinum/Nodes: No hilar or mediastinal adenopathy. The esophagus
and thyroid gland grossly unremarkable. No mediastinal fluid
collection.

Lungs/Pleura: Left lung base linear atelectasis/scarring. Several
small scattered bilateral pulmonary nodules measuring up to 6 mm in
the left lower lobe (96/4). Interval resolution of the previously
seen mosaic attenuation of the lung parenchyma. No focal
consolidation, pleural effusion, or pneumothorax. The central
airways are patent.

Upper Abdomen: Fatty liver.

Musculoskeletal: Mild degenerative changes. No acute osseous
pathology.
IMPRESSION: 1. Interval resolution of the previously seen mosaic attenuation of
the lung parenchyma.
2. Several small scattered bilateral pulmonary nodules measuring up
to 6 mm in the left lower lobe. Non-contrast chest CT at 3-6 months
is recommended. If the nodules are stable at time of repeat CT, then
future CT at 18-24 months (from today's scan) is considered optional
for low-risk patients, but is recommended for high-risk patients.
This recommendation follows the consensus statement: Guidelines for
Management of Incidental Pulmonary Nodules Detected on CT Images:
3. Fatty liver.
4. Aortic Atherosclerosis ([id number].A).

## 2023-07-09 ENCOUNTER — Encounter: Payer: Self-pay | Admitting: Gastroenterology

## 2023-07-10 ENCOUNTER — Other Ambulatory Visit: Payer: Self-pay

## 2023-07-10 ENCOUNTER — Other Ambulatory Visit: Payer: Self-pay | Admitting: Gastroenterology

## 2023-07-10 ENCOUNTER — Ambulatory Visit
Admission: RE | Admit: 2023-07-10 | Discharge: 2023-07-10 | Disposition: A | Discharge status: Home / Self Care | Payer: Medicare HMO | Attending: Gastroenterology | Admitting: Gastroenterology

## 2023-07-10 ENCOUNTER — Ambulatory Visit: Payer: Medicare HMO | Admitting: Anesthesiology

## 2023-07-10 ENCOUNTER — Encounter
Admission: RE | Disposition: A | Discharge status: Home / Self Care | Payer: Self-pay | Source: Home / Self Care | Attending: Gastroenterology

## 2023-07-10 ENCOUNTER — Encounter: Payer: Self-pay | Admitting: Gastroenterology

## 2023-07-10 DIAGNOSIS — D124 Benign neoplasm of descending colon: Secondary | ICD-10-CM | POA: Diagnosis not present

## 2023-07-10 DIAGNOSIS — K529 Noninfective gastroenteritis and colitis, unspecified: Secondary | ICD-10-CM | POA: Diagnosis not present

## 2023-07-10 DIAGNOSIS — Z794 Long term (current) use of insulin: Secondary | ICD-10-CM | POA: Diagnosis not present

## 2023-07-10 DIAGNOSIS — I10 Essential (primary) hypertension: Secondary | ICD-10-CM | POA: Insufficient documentation

## 2023-07-10 DIAGNOSIS — E109 Type 1 diabetes mellitus without complications: Secondary | ICD-10-CM | POA: Diagnosis not present

## 2023-07-10 DIAGNOSIS — F32A Depression, unspecified: Secondary | ICD-10-CM | POA: Diagnosis not present

## 2023-07-10 DIAGNOSIS — M1990 Unspecified osteoarthritis, unspecified site: Secondary | ICD-10-CM | POA: Insufficient documentation

## 2023-07-10 DIAGNOSIS — Z79899 Other long term (current) drug therapy: Secondary | ICD-10-CM | POA: Insufficient documentation

## 2023-07-10 DIAGNOSIS — Z1211 Encounter for screening for malignant neoplasm of colon: Secondary | ICD-10-CM | POA: Insufficient documentation

## 2023-07-10 DIAGNOSIS — D127 Benign neoplasm of rectosigmoid junction: Secondary | ICD-10-CM | POA: Insufficient documentation

## 2023-07-10 DIAGNOSIS — Z7984 Long term (current) use of oral hypoglycemic drugs: Secondary | ICD-10-CM | POA: Diagnosis not present

## 2023-07-10 DIAGNOSIS — F419 Anxiety disorder, unspecified: Secondary | ICD-10-CM | POA: Insufficient documentation

## 2023-07-10 DIAGNOSIS — E785 Hyperlipidemia, unspecified: Secondary | ICD-10-CM | POA: Insufficient documentation

## 2023-07-10 DIAGNOSIS — K5730 Diverticulosis of large intestine without perforation or abscess without bleeding: Secondary | ICD-10-CM | POA: Insufficient documentation

## 2023-07-10 DIAGNOSIS — D121 Benign neoplasm of appendix: Secondary | ICD-10-CM | POA: Insufficient documentation

## 2023-07-10 DIAGNOSIS — Z86010 Personal history of colonic polyps: Secondary | ICD-10-CM | POA: Insufficient documentation

## 2023-07-10 DIAGNOSIS — Z09 Encounter for follow-up examination after completed treatment for conditions other than malignant neoplasm: Secondary | ICD-10-CM | POA: Insufficient documentation

## 2023-07-10 DIAGNOSIS — Z9049 Acquired absence of other specified parts of digestive tract: Secondary | ICD-10-CM | POA: Diagnosis not present

## 2023-07-10 DIAGNOSIS — G4730 Sleep apnea, unspecified: Secondary | ICD-10-CM | POA: Insufficient documentation

## 2023-07-10 DIAGNOSIS — Z7985 Long-term (current) use of injectable non-insulin antidiabetic drugs: Secondary | ICD-10-CM | POA: Diagnosis not present

## 2023-07-10 HISTORY — PX: BIOPSY: SHX5522

## 2023-07-10 HISTORY — PX: POLYPECTOMY: SHX5525

## 2023-07-10 HISTORY — DX: Sleep apnea, unspecified: G47.30

## 2023-07-10 HISTORY — PX: COLONOSCOPY WITH PROPOFOL: SHX5780

## 2023-07-10 LAB — GLUCOSE, CAPILLARY: Glucose-Capillary: 140 mg/dL — ABNORMAL HIGH (ref 70–99)

## 2023-07-10 SURGERY — COLONOSCOPY WITH PROPOFOL
Anesthesia: General

## 2023-07-10 MED ORDER — PROPOFOL 10 MG/ML IV BOLUS
INTRAVENOUS | Status: DC | PRN
  Administered 2023-07-10: 30 mg via INTRAVENOUS

## 2023-07-10 MED ORDER — SODIUM CHLORIDE 0.9 % IV SOLN: INTRAVENOUS | Status: DC

## 2023-07-10 MED ORDER — PROPOFOL 1000 MG/100ML IV EMUL
INTRAVENOUS | Status: AC
  Filled 2023-07-10: qty 100

## 2023-07-10 MED ORDER — LIDOCAINE HCL (CARDIAC) PF 100 MG/5ML IV SOSY
PREFILLED_SYRINGE | INTRAVENOUS | Status: DC | PRN
  Administered 2023-07-10: 60 mg via INTRAVENOUS

## 2023-07-10 MED ORDER — PROPOFOL 500 MG/50ML IV EMUL
INTRAVENOUS | Status: DC | PRN
  Administered 2023-07-10: 50 ug/kg/min via INTRAVENOUS

## 2023-07-10 MED ORDER — LIDOCAINE HCL (PF) 2 % IJ SOLN
INTRAMUSCULAR | Status: AC
  Filled 2023-07-10: qty 5

## 2023-07-10 NOTE — Op Note (Signed)
[facility] Gastroenterology Patient Name: Emma White Procedure Date: 07/10/2023 7:37 AM MRN: [id number] Account #: [id number] Date of Birth: Oct 17, 1956 Admit Type: Outpatient Age: 67 Room: [facility] ENDO ROOM 1 Gender: Female Note Status: Finalized Instrument Name: Colonoscope [id number] Procedure:             Colonoscopy Indications:           High risk colon cancer surveillance: Personal history                         of colonic polyps Providers:             Jaynie Collins DO, DO Referring MD:          No Local Md, MD (Referring MD) Medicines:             Monitored Anesthesia Care Complications:         No immediate complications. Estimated blood loss:                         Minimal. Procedure:             Pre-Anesthesia Assessment:                        - Prior to the procedure, a History and Physical was                         performed, and patient medications and allergies were                         reviewed. The patient is competent. The risks and                         benefits of the procedure and the sedation options and                         risks were discussed with the patient. All questions                         were answered and informed consent was obtained.                         Patient identification and proposed procedure were                         verified by the physician, the nurse, the anesthetist                         and the technician in the endoscopy suite. Mental                         Status Examination: alert and oriented. Airway                         Examination: normal oropharyngeal airway and neck                         mobility. Respiratory Examination: clear to  auscultation. CV Examination: RRR, no murmurs, no S3                         or S4. Prophylactic Antibiotics: The patient does not                         require prophylactic antibiotics. Prior                          Anticoagulants: The patient has taken no anticoagulant                         or antiplatelet agents. ASA Grade Assessment: III - A                         patient with severe systemic disease. After reviewing                         the risks and benefits, the patient was deemed in                         satisfactory condition to undergo the procedure. The                         anesthesia plan was to use monitored anesthesia care                         (MAC). Immediately prior to administration of                         medications, the patient was re-assessed for adequacy                         to receive sedatives. The heart rate, respiratory                         rate, oxygen saturations, blood pressure, adequacy of                         pulmonary ventilation, and response to care were                         monitored throughout the procedure. The physical                         status of the patient was re-assessed after the                         procedure.                        After obtaining informed consent, the colonoscope was                         passed under direct vision. Throughout the procedure,                         the patient's blood pressure, pulse, and oxygen  saturations were monitored continuously. The                         Colonoscope was introduced through the anus and                         advanced to the the cecum, identified by appendiceal                         orifice and ileocecal valve. The colonoscopy was                         performed without difficulty. The patient tolerated                         the procedure well. The quality of the bowel                         preparation was evaluated using the BBPS [facility] Bowel                         Preparation Scale) with scores of: Right Colon = 2                         (minor amount of residual staining, small fragments of                         stool  and/or opaque liquid, but mucosa seen well),                         Transverse Colon = 2 (minor amount of residual                         staining, small fragments of stool and/or opaque                         liquid, but mucosa seen well) and Left Colon = 2                         (minor amount of residual staining, small fragments of                         stool and/or opaque liquid, but mucosa seen well). The                         total BBPS score equals 6. The quality of the bowel                         preparation was good. The terminal ileum, ileocecal                         valve, appendiceal orifice, and rectum were                         photographed. Findings:      The perianal and digital rectal examinations were normal. Pertinent       negatives include normal sphincter tone.  Retroflexion in the right colon was performed.      Multiple small-mouthed diverticula were found in the left colon.       Estimated blood loss: none.      Localized mild inflammation characterized by erosions was found in the       descending colon. Biopsies were taken with a cold forceps for histology.       Estimated blood loss was minimal.      Two sessile polyps were found in the recto-sigmoid colon and descending       colon. The polyps were 1 to 2 mm in size. These polyps were removed with       a jumbo cold forceps. Resection and retrieval were complete. Estimated       blood loss was minimal.      A 6 to 7 mm polyp was found in the appendiceal orifice. The polyp was       sessile. The polyp was removed with a cold snare. Resection and       retrieval were complete. Estimated blood loss was minimal.      The exam was otherwise without abnormality on direct and retroflexion       views. Impression:            - Diverticulosis in the left colon.                        - Localized mild inflammation was found in the                         descending colon. Biopsied.                         - Two 1 to 2 mm polyps at the recto-sigmoid colon and                         in the descending colon, removed with a jumbo cold                         forceps. Resected and retrieved.                        - One 6 to 7 mm polyp at the appendiceal orifice,                         removed with a cold snare. Resected and retrieved.                        - The examination was otherwise normal on direct and                         retroflexion views. Recommendation:        - Patient has a contact number available for                         emergencies. The signs and symptoms of potential                         delayed complications were discussed with the patient.  Return to normal activities tomorrow. Written                         discharge instructions were provided to the patient.                        - Discharge patient to home.                        - Resume previous diet.                        - Continue present medications.                        - No ibuprofen, naproxen, or other non-steroidal                         anti-inflammatory drugs for 5 days after polyp removal.                        - Await pathology results.                        - Repeat colonoscopy for surveillance based on                         pathology results.                        - Return to GI office as previously scheduled.                        - The findings and recommendations were discussed with                         the patient.                        - The findings and recommendations were discussed with                         the patient's family. Procedure Code(s):     --- Professional ---                        [phone], Colonoscopy, flexible; with removal of                         tumor(s), polyp(s), or other lesion(s) by snare                         technique                        45380, 59, Colonoscopy, flexible; with biopsy, single                          or multiple Diagnosis Code(s):     --- Professional ---                        Z86.010, Personal history of colonic polyps  K52.9, Noninfective gastroenteritis and colitis,                         unspecified                        D12.7, Benign neoplasm of rectosigmoid junction                        D12.4, Benign neoplasm of descending colon                        D12.1, Benign neoplasm of appendix                        K57.30, Diverticulosis of large intestine without                         perforation or abscess without bleeding CPT copyright 2022 American Medical Association. All rights reserved. The codes documented in this report are preliminary and upon coder review may  be revised to meet current compliance requirements. Attending Participation:      I personally performed the entire procedure. Elfredia Nevins, DO Jaynie Collins DO, DO 07/10/2023 8:18:56 AM This report has been signed electronically. Number of Addenda: 0 Note Initiated On: 07/10/2023 7:37 AM Scope Withdrawal Time: 0 hours 19 minutes 40 seconds  Total Procedure Duration: 0 hours 28 minutes 45 seconds  Estimated Blood Loss:  Estimated blood loss was minimal.      [facility]

## 2023-07-10 NOTE — Anesthesia Postprocedure Evaluation (Signed)
Anesthesia Post Note  Patient: Berneta Sconyers Carpino  Procedure(s) Performed: COLONOSCOPY WITH PROPOFOL POLYPECTOMY BIOPSY  Patient location during evaluation: Endoscopy Anesthesia Type: General Level of consciousness: awake and alert Pain management: pain level controlled Vital Signs Assessment: post-procedure vital signs reviewed and stable Respiratory status: spontaneous breathing, nonlabored ventilation, respiratory function stable and patient connected to nasal cannula oxygen Cardiovascular status: blood pressure returned to baseline and stable Postop Assessment: no apparent nausea or vomiting Anesthetic complications: no   No notable events documented.   Last Vitals:  Vitals:   07/10/23 0817 07/10/23 0827  BP: (!) 101/53 (!) 110/54  Pulse: 60 60  Resp: 17 15  Temp: (!) 35.6 C   SpO2: 95% 93%    Last Pain:  Vitals:   07/10/23 0827  TempSrc:   PainSc: Asleep                 Corinda Gubler

## 2023-07-10 NOTE — Transfer of Care (Signed)
Immediate Anesthesia Transfer of Care Note  Patient: Emma White  Procedure(s) Performed: COLONOSCOPY WITH PROPOFOL POLYPECTOMY  Patient Location: PACU  Anesthesia Type:General  Level of Consciousness: sedated  Airway & Oxygen Therapy: Patient Spontanous Breathing  Post-op Assessment: Report given to RN and Post -op Vital signs reviewed and stable  Post vital signs: Reviewed and stable  Last Vitals:  Vitals Value Taken Time  BP    Temp    Pulse    Resp    SpO2      Last Pain:  Vitals:   07/10/23 0720  TempSrc: Temporal  PainSc: 0-No pain         Complications: No notable events documented.

## 2023-07-10 NOTE — H&P (Signed)
Pre-Procedure H&P   Patient ID: Emma White is a 67 y.o. female.  Gastroenterology Provider: Jaynie Collins, DO  Referring Provider: Tawni Pummel, PA PCP: Gildardo Pounds, PA  Date: 07/10/2023  HPI Ms. Emma White is a 67 y.o. female who presents today for Colonoscopy for phx colon polyps .  Patient with a personal history of colon polyps.  She last underwent colonoscopy in February 2023 with 3 adenomatous polyps and diverticulosis.  Appendiceal orifice was noted for granularity with polyp within inverted appendix.  She underwent laparoscopic appendectomy with partial cecal resection. TA was noted at the edge of the cecal resection margin (03/2022).  November 2022 she also had tubular adenomas as well as in July 2022 but had poor prep at that time.  Random biopsies negative for microscopic colitis.  She continued has postprandial loose bowel movements with urgency.  This is improved by Kaopectate but not Imodium..  Keratic elastase was 163 and she was started on Creon.  Brother with history of colon polyps  She is on Ozempic which been held for this procedure- last dose 7/13.   Past Medical History:  Diagnosis Date   Anxiety    Arthritis    hand   Complication of anesthesia 03/18/2022   Hypoxia in pacu. See pacu note 03/18/2022. The hospitalist was consulted for further work-up.   Depression    Diabetes mellitus without complication (HCC)    Dyspnea    DOE   Hyperlipemia    Hypertension    Neuropathy    Sleep apnea     Past Surgical History:  Procedure Laterality Date   ABDOMINAL HYSTERECTOMY  1995   CATARACT EXTRACTION W/PHACO Left 09/25/2017   Procedure: CATARACT EXTRACTION PHACO AND INTRAOCULAR LENS PLACEMENT (IOC);  Surgeon: [place] Crane, MD;  Location: [facility];  Service: Ophthalmology;  Laterality: Left;   flui pack lot # [id number]  [place] 00:32.7 AP%   12.17 CDE   3.95   CATARACT EXTRACTION W/PHACO Right 10/16/2017   Procedure: CATARACT  EXTRACTION PHACO AND INTRAOCULAR LENS PLACEMENT (IOC);  Surgeon: [place] Crane, MD;  Location: [facility];  Service: Ophthalmology;  Laterality: Right;  [place] 00:32.8 AP% 9.0 CDE 2.96 FLUID PACK LOT # [id number]   COLONOSCOPY WITH PROPOFOL N/A 05/27/2016   Procedure: COLONOSCOPY WITH PROPOFOL;  Surgeon: Scot Jun, MD;  Location: [facility];  Service: Endoscopy;  Laterality: N/A;   COLONOSCOPY WITH PROPOFOL N/A 07/10/2021   Procedure: COLONOSCOPY WITH PROPOFOL;  Surgeon: Regis Bill, MD;  Location: [facility] ENDOSCOPY;  Service: Endoscopy;  Laterality: N/A;   COLONOSCOPY WITH PROPOFOL N/A 10/30/2021   Procedure: COLONOSCOPY WITH PROPOFOL;  Surgeon: Regis Bill, MD;  Location: [facility];  Service: Endoscopy;  Laterality: N/A;   COLONOSCOPY WITH PROPOFOL N/A 02/05/2022   Procedure: COLONOSCOPY WITH PROPOFOL;  Surgeon: Regis Bill, MD;  Location: [facility] END[facility];  Service: Endoscopy;  Laterality: N/A;   ESOPHAGOGASTRODUODENOSCOPY (EGD) WITH PROPOFOL N/A 07/10/2021   Procedure: ESOPHAGOGASTRODUODENOSCOPY (EGD) WITH PROPOFOL;  Surgeon: Regis Bill, MD;  Location: [facility];  Service: Endoscopy;  Laterality: N/A;  IDDM   ESOPHAGOGASTRODUODENOSCOPY (EGD) WITH PROPOFOL N/A 10/30/2021   Procedure: ESOPHAGOGASTRODUODENOSCOPY (EGD) WITH PROPOFOL;  Surgeon: Regis Bill, MD;  Location: [facility] END[facility];  Service: Endoscopy;  Laterality: N/A;  IDDM   EYE SURGERY Left 09/25/2017   cataract extraction   HYSTERECTOMY SUPRACERVICAL ABDOMINAL W/REMOVAL TUBES &/OR OVARIES  1995   KENALOG INJECTION Right 10/16/2017   Procedure: KENALOG INJECTION;  Surgeon:  [place] Crane, MD;  Location: [facility];  Service: Ophthalmology;  Laterality: Right;   KNEE ARTHROCENTESIS Left 1981   KNEE ARTHROCENTESIS Bilateral 1992   right one in 1992   KNEE ARTHROCENTESIS Bilateral    XI ROBOTIC LAPAROSCOPIC ASSISTED APPENDECTOMY N/A 03/18/2022   Procedure: XI ROBOTIC LAPAROSCOPIC  ASSISTED APPENDECTOMY;  Surgeon: Carolan Shiver, MD;  Location: [facility];  Service: General;  Laterality: N/A;    Family History Brother- colon polyps No h/o GI disease or malignancy  Review of Systems  Constitutional:  Negative for activity change, appetite change, chills, diaphoresis, fatigue, fever and unexpected weight change.  HENT:  Negative for trouble swallowing and voice change.   Respiratory:  Negative for shortness of breath and wheezing.   Cardiovascular:  Negative for chest pain, palpitations and leg swelling.  Gastrointestinal:  Positive for diarrhea. Negative for abdominal distention, abdominal pain, anal bleeding, blood in stool, constipation, nausea, rectal pain and vomiting.  Musculoskeletal:  Negative for arthralgias and myalgias.  Skin:  Negative for color change and pallor.  Neurological:  Negative for dizziness, syncope and weakness.  Psychiatric/Behavioral:  Negative for confusion.   All other systems reviewed and are negative.    Medications No current facility-administered medications on file prior to encounter.   Current Outpatient Medications on File Prior to Encounter  Medication Sig Dispense Refill   aspirin 81 MG chewable tablet Chew 81 mg by mouth daily.     gabapentin (NEURONTIN) 800 MG tablet Take 800 mg by mouth 3 (three) times daily.     metFORMIN (GLUCOPHAGE) 500 MG tablet Take 500 mg by mouth 3 (three) times daily.     pantoprazole (PROTONIX) 40 MG tablet Take 40 mg by mouth daily.     sitaGLIPtin (JANUVIA) 100 MG tablet Take 100 mg by mouth daily.     acetaminophen (TYLENOL) 650 MG CR tablet Take 650 mg by mouth 2 (two) times daily.     amLODipine (NORVASC) 10 MG tablet Take 10 mg by mouth daily.     benzonatate (TESSALON) 100 MG capsule Take 100 mg by mouth 3 (three) times daily as needed for cough.     Cholecalciferol (VITAMIN D3) 125 MCG (5000 UT) CAPS Take by mouth.     citalopram (CELEXA) 20 MG tablet Take 20 mg by mouth daily. With  dinner     ibuprofen (ADVIL,MOTRIN) 200 MG tablet Take 400 mg by mouth every 6 (six) hours as needed for moderate pain.     insulin aspart (NOVOLOG) 100 UNIT/ML injection Inject 10 Units into the skin 3 (three) times daily with meals.     insulin detemir (LEVEMIR) 100 UNIT/ML injection Inject 30 Units into the skin at bedtime.      Potassium 99 MG TABS Take 1 tablet by mouth daily. With breakfast     Potassium Gluconate 2.5 MEQ TABS Take 2.5 mEq by mouth daily.     pravastatin (PRAVACHOL) 10 MG tablet Take 10 mg by mouth daily.     Semaglutide (OZEMPIC, 0.25 OR 0.5 MG/DOSE, [place]) Inject into the skin.      Pertinent medications related to GI and procedure were reviewed by me with the patient prior to the procedure  No current facility-administered medications for this encounter.      No Known Allergies Allergies were reviewed by me prior to the procedure  Objective   Body mass index is 28.47 kg/m. Vitals:   07/10/23 0720  BP: 134/64  Pulse: 73  Resp: 20  Temp: (!) 97.3  F (36.3 C)  TempSrc: Temporal  SpO2: 96%  Weight: 66.1 kg  Height: 5' (1.524 m)     Physical Exam Vitals and nursing note reviewed.  Constitutional:      General: She is not in acute distress.    Appearance: Normal appearance. She is not ill-appearing, toxic-appearing or diaphoretic.  HENT:     Head: Normocephalic and atraumatic.     Nose: Nose normal.     Mouth/Throat:     Mouth: Mucous membranes are moist.     Pharynx: Oropharynx is clear.  Eyes:     General: No scleral icterus.    Extraocular Movements: Extraocular movements intact.  Cardiovascular:     Rate and Rhythm: Normal rate and regular rhythm.     Heart sounds: Normal heart sounds. No murmur heard.    No friction rub. No gallop.  Pulmonary:     Effort: Pulmonary effort is normal. No respiratory distress.     Breath sounds: Normal breath sounds. No wheezing, rhonchi or rales.  Abdominal:     General: Abdomen is flat. Bowel sounds are  normal. There is no distension.     Palpations: Abdomen is soft.     Tenderness: There is no abdominal tenderness. There is no guarding or rebound.  Musculoskeletal:     Cervical back: Neck supple.     Right lower leg: No edema.     Left lower leg: No edema.  Skin:    General: Skin is warm and dry.     Coloration: Skin is not jaundiced or pale.  Neurological:     General: No focal deficit present.     Mental Status: She is alert and oriented to person, place, and time. Mental status is at baseline.  Psychiatric:        Mood and Affect: Mood normal.        Behavior: Behavior normal.        Thought Content: Thought content normal.        Judgment: Judgment normal.      Assessment:  Ms. Emma White is a 67 y.o. female  who presents today for Colonoscopy for phx colon polyps .  Plan:  Colonoscopy with possible intervention today  Colonoscopy with possible biopsy, control of bleeding, polypectomy, and interventions as necessary has been discussed with the patient/patient representative. Informed consent was obtained from the patient/patient representative after explaining the indication, nature, and risks of the procedure including but not limited to death, bleeding, perforation, missed neoplasm/lesions, cardiorespiratory compromise, and reaction to medications. Opportunity for questions was given and appropriate answers were provided. Patient/patient representative has verbalized understanding is amenable to undergoing the procedure.   Jaynie Collins, DO  [facility]  Portions of the record may have been created with voice recognition software. Occasional wrong-word or 'sound-a-like' substitutions may have occurred due to the inherent limitations of voice recognition software.  Read the chart carefully and recognize, using context, where substitutions may have occurred.

## 2023-07-10 NOTE — Anesthesia Preprocedure Evaluation (Signed)
Anesthesia Evaluation  Patient identified by MRN, date of birth, ID band Patient awake    Reviewed: Allergy & Precautions, NPO status , Patient's Chart, lab work & pertinent test results  History of Anesthesia Complications Negative for: history of anesthetic complications  Airway Mallampati: II  TM Distance: >3 FB Neck ROM: Full    Dental no notable dental hx. (+) Teeth Intact   Pulmonary sleep apnea , neg COPD, Patient abstained from smoking.Not current smoker   Pulmonary exam normal breath sounds clear to auscultation       Cardiovascular Exercise Tolerance: Good METShypertension, Pt. on medications (-) CAD and (-) Past MI (-) dysrhythmias  Rhythm:Regular Rate:Normal - Systolic murmurs    Neuro/Psych  PSYCHIATRIC DISORDERS Anxiety Depression    negative neurological ROS     GI/Hepatic ,neg GERD  ,,(+)     (-) substance abuse    Endo/Other  diabetes, Insulin Dependent  Last taken GLP1 over 7 days ago. No GI symptoms today  Renal/GU negative Renal ROS     Musculoskeletal   Abdominal   Peds  Hematology   Anesthesia Other Findings Past Medical History: No date: Anxiety No date: Arthritis     Comment:  hand 03/18/2022: Complication of anesthesia     Comment:  Hypoxia in pacu. See pacu note 03/18/2022. The hospitalist              was consulted for further work-up. No date: Depression No date: Diabetes mellitus without complication (HCC) No date: Dyspnea     Comment:  DOE No date: Hyperlipemia No date: Hypertension No date: Neuropathy No date: Sleep apnea  Reproductive/Obstetrics                             Anesthesia Physical Anesthesia Plan  ASA: 3  Anesthesia Plan: General   Post-op Pain Management: Minimal or no pain anticipated   Induction: Intravenous  PONV Risk Score and Plan: 3 and Propofol infusion, TIVA and Ondansetron  Airway Management Planned: Nasal  Cannula  Additional Equipment: None  Intra-op Plan:   Post-operative Plan:   Informed Consent: I have reviewed the patients History and Physical, chart, labs and discussed the procedure including the risks, benefits and alternatives for the proposed anesthesia with the patient or authorized representative who has indicated his/her understanding and acceptance.     Dental advisory given  Plan Discussed with: CRNA and Surgeon  Anesthesia Plan Comments: (Discussed risks of anesthesia with patient, including possibility of difficulty with spontaneous ventilation under anesthesia necessitating airway intervention, PONV, and rare risks such as cardiac or respiratory or neurological events, and allergic reactions. Discussed the role of CRNA in patient's perioperative care. Patient understands.)       Anesthesia Quick Evaluation

## 2023-07-10 NOTE — Interval H&P Note (Signed)
History and Physical Interval Note: Preprocedure H&P from 07/10/23  was reviewed and there was no interval change after seeing and examining the patient.  Written consent was obtained from the patient after discussion of risks, benefits, and alternatives. Patient has consented to proceed with Colonoscopy with possible intervention   07/10/2023 7:36 AM  [name]  has presented today for surgery, with the diagnosis of V12.72 (ICD-9-CM) - Z86.010 (ICD-10-CM) - Personal history of colonic polyps.  The various methods of treatment have been discussed with the patient and family. After consideration of risks, benefits and other options for treatment, the patient has consented to  Procedure(s): COLONOSCOPY WITH PROPOFOL (N/A) as a surgical intervention.  The patient's history has been reviewed, patient examined, no change in status, stable for surgery.  I have reviewed the patient's chart and labs.  Questions were answered to the patient's satisfaction.     Emma White

## 2023-07-11 ENCOUNTER — Encounter: Payer: Self-pay | Admitting: Gastroenterology

## 2023-11-12 ENCOUNTER — Other Ambulatory Visit: Payer: Self-pay | Admitting: Emergency Medicine

## 2023-11-12 DIAGNOSIS — R911 Solitary pulmonary nodule: Secondary | ICD-10-CM

## 2023-11-12 DIAGNOSIS — G4733 Obstructive sleep apnea (adult) (pediatric): Secondary | ICD-10-CM

## 2023-11-25 ENCOUNTER — Ambulatory Visit
Admission: RE | Admit: 2023-11-25 | Discharge: 2023-11-25 | Disposition: A | Discharge status: Home / Self Care | Payer: Medicare HMO | Source: Ambulatory Visit | Attending: Emergency Medicine | Admitting: Emergency Medicine

## 2023-11-25 DIAGNOSIS — R911 Solitary pulmonary nodule: Secondary | ICD-10-CM | POA: Insufficient documentation

## 2023-11-25 DIAGNOSIS — G4733 Obstructive sleep apnea (adult) (pediatric): Secondary | ICD-10-CM | POA: Diagnosis present

## 2024-03-07 DIAGNOSIS — M5126 Other intervertebral disc displacement, lumbar region: Secondary | ICD-10-CM | POA: Insufficient documentation

## 2024-04-22 ENCOUNTER — Ambulatory Visit: Attending: Neurological Surgery

## 2024-04-22 DIAGNOSIS — R262 Difficulty in walking, not elsewhere classified: Secondary | ICD-10-CM | POA: Insufficient documentation

## 2024-04-22 DIAGNOSIS — M5459 Other low back pain: Secondary | ICD-10-CM | POA: Diagnosis present

## 2024-04-22 DIAGNOSIS — M5416 Radiculopathy, lumbar region: Secondary | ICD-10-CM | POA: Insufficient documentation

## 2024-04-22 NOTE — Therapy (Signed)
 OUTPATIENT PHYSICAL THERAPY THORACOLUMBAR EVALUATION   Patient Name: Emma White MRN: [id number] DOB:04/07/1956, 68 y.o., female Today's Date: 04/22/2024  END OF SESSION:  PT End of Session - 04/22/24 1434     Visit Number 1    Number of Visits 17    Date for PT Re-Evaluation 06/18/24    PT Start Time 1434    PT Stop Time 1516    PT Time Calculation (min) 42 min    Activity Tolerance Patient tolerated treatment well    Behavior During Therapy [facility] for tasks assessed/performed             Past Medical History:  Diagnosis Date   Anxiety    Arthritis    hand   Complication of anesthesia 03/18/2022   Hypoxia in pacu. See pacu note 03/18/2022. The hospitalist was consulted for further work-up.   Depression    Diabetes mellitus without complication (HCC)    Dyspnea    DOE   Hyperlipemia    Hypertension    Neuropathy    Sleep apnea    Past Surgical History:  Procedure Laterality Date   ABDOMINAL HYSTERECTOMY  1995   BIOPSY  07/10/2023   Procedure: BIOPSY;  Surgeon: Quintin Buckle, DO;  Location: [facility] ENDOSCOPY;  Service: Gastroenterology;;   CATARACT EXTRACTION W/PHACO Left 09/25/2017   Procedure: CATARACT EXTRACTION PHACO AND INTRAOCULAR LENS PLACEMENT (IOC);  Surgeon: Rosa College, MD;  Location: [facility] ORS;  Service: Ophthalmology;  Laterality: Left;   flui pack lot # [id number] H  US  00:32.7 AP%   12.17 CDE   3.95   CATARACT EXTRACTION W/PHACO Right 10/16/2017   Procedure: CATARACT EXTRACTION PHACO AND INTRAOCULAR LENS PLACEMENT (IOC);  Surgeon: Rosa College, MD;  Location: [facility] ORS;  Service: Ophthalmology;  Laterality: Right;  US  00:32.8 AP% 9.0 CDE 2.96 FLUID PACK LOT # [id number]   COLONOSCOPY WITH PROPOFOL  N/A 05/27/2016   Procedure: COLONOSCOPY WITH PROPOFOL ;  Surgeon: Cassie Click, MD;  Location: [facility] ENDOSCOPY;  Service: Endoscopy;  Laterality: N/A;   COLONOSCOPY WITH PROPOFOL  N/A 07/10/2021   Procedure: COLONOSCOPY WITH PROPOFOL ;   Surgeon: Shane Darling, MD;  Location: [facility] ENDOSCOPY;  Service: Endoscopy;  Laterality: N/A;   COLONOSCOPY WITH PROPOFOL  N/A 10/30/2021   Procedure: COLONOSCOPY WITH PROPOFOL ;  Surgeon: Shane Darling, MD;  Location: [facility] ENDOSCOPY;  Service: Endoscopy;  Laterality: N/A;   COLONOSCOPY WITH PROPOFOL  N/A 02/05/2022   Procedure: COLONOSCOPY WITH PROPOFOL ;  Surgeon: Shane Darling, MD;  Location: [facility] ENDOSCOPY;  Service: Endoscopy;  Laterality: N/A;   COLONOSCOPY WITH PROPOFOL  N/A 07/10/2023   Procedure: COLONOSCOPY WITH PROPOFOL ;  Surgeon: Quintin Buckle, DO;  Location: [facility] ENDOSCOPY;  Service: Gastroenterology;  Laterality: N/A;   ESOPHAGOGASTRODUODENOSCOPY (EGD) WITH PROPOFOL  N/A 07/10/2021   Procedure: ESOPHAGOGASTRODUODENOSCOPY (EGD) WITH PROPOFOL ;  Surgeon: Shane Darling, MD;  Location: [facility] ENDOSCOPY;  Service: Endoscopy;  Laterality: N/A;  IDDM   ESOPHAGOGASTRODUODENOSCOPY (EGD) WITH PROPOFOL  N/A 10/30/2021   Procedure: ESOPHAGOGASTRODUODENOSCOPY (EGD) WITH PROPOFOL ;  Surgeon: Shane Darling, MD;  Location: [facility] ENDOSCOPY;  Service: Endoscopy;  Laterality: N/A;  IDDM   EYE SURGERY Left 09/25/2017   cataract extraction   HYSTERECTOMY SUPRACERVICAL ABDOMINAL W/REMOVAL TUBES &/OR OVARIES  1995   KENALOG  INJECTION Right 10/16/2017   Procedure: KENALOG  INJECTION;  Surgeon: Rosa College, MD;  Location: [facility] ORS;  Service: Ophthalmology;  Laterality: Right;   KNEE ARTHROCENTESIS Left 1981   KNEE ARTHROCENTESIS Bilateral 1992   right one in 1992   KNEE  ARTHROCENTESIS Bilateral    POLYPECTOMY  07/10/2023   Procedure: POLYPECTOMY;  Surgeon: Quintin Buckle, DO;  Location: [facility] ENDOSCOPY;  Service: Gastroenterology;;   XI ROBOTIC LAPAROSCOPIC ASSISTED APPENDECTOMY N/A 03/18/2022   Procedure: XI ROBOTIC LAPAROSCOPIC ASSISTED APPENDECTOMY;  Surgeon: Eldred Grego, MD;  Location: [facility] ORS;  Service: General;  Laterality: N/A;   Patient Active  Problem List   Diagnosis Date Noted   Hypoxia 03/18/2022   Hypertension    Diabetes mellitus without complication (HCC)    Depression    Anemia    History of laparoscopic appendectomy     PCP: Jefferey Minerva, PA   REFERRING PROVIDER: Tommas Fragmin Dipakkumar, MD  REFERRING DIAG: [phone] (ICD-10-CM) - Other intervertebral disc degeneration, lumbar region without mention of lumbar back pain or lower extremity pain  Rationale for Evaluation and Treatment: Rehabilitation  THERAPY DIAG:  Other low back pain - Plan: PT plan of care cert/re-cert  Radiculopathy, lumbar region - Plan: PT plan of care cert/re-cert  Difficulty in walking, not elsewhere classified - Plan: PT plan of care cert/re-cert  ONSET DATE: December 2024  SUBJECTIVE:                                                                                                                                                                                           SUBJECTIVE STATEMENT: 8/10 low back pain at worst for the past 3 months.  9/10 R LE pain for the past 3 months.   (No R LE pain currently, pt sitting on a chair leaning to the L)  PERTINENT HISTORY:  Low back pain. Currently wearing a back brace which is not helping. Pt states having a bulging disc. Pain started December 2024, sudden onset, unknown mechanism of injury. Pt states having R Sciatic nerve in R posterior hip pain initially. Had x-ryas done, then and MRI which revealed bulging disc. Pain radiates along the R L5 dermatome as well as sciatic nerve (to superficial peroneal nerve distribution) to dorsal foot. R anterior ankle hurts. Pain has worsened since January 2025. Surgeon wants pt to do PT.      No latex allergies   Had L eye surgery 03/17/2024, pt states she is clear to participate in PT. 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  Blood pressure is controlled per pt.  DM is controlled per pt.  Has a CPAP machine.     PAIN:  Are you having pain? Yes:  NPRS scale: 8/10 Pain location: Low back and R L5 dermatome and R sciatic nerve distribution to R dorsal foot.  Pain description: Low back: pressure; R LE: ache, tight Aggravating factors:  Sleeping on her R side, standing and walking Relieving factors: leaning to the L, sleeping on her L side, leaning forward  PRECAUTIONS: Cannot see with L eye.     RED FLAGS: Bowel or bladder incontinence: No and Cauda equina syndrome: No   WEIGHT BEARING RESTRICTIONS: No  FALLS:  Has patient fallen in last 6 months? No  LIVING ENVIRONMENT: Lives with: lives with their spouse Lives in: House/apartment Stairs: Yes: Internal: 2 steps; none and External: 0 steps; none Has following equipment at home: Single point cane  OCCUPATION: Housewife  PLOF: Independent  PATIENT GOALS: decrease pain  NEXT MD VISIT: none at the moment  OBJECTIVE:  Note: Objective measures were completed at Evaluation unless otherwise noted.  DIAGNOSTIC FINDINGS:    PATIENT SURVEYS:  Modified Oswestry Low Back Pain Disability Questionnaire 62% (04/22/2024)  COGNITION: Overall cognitive status: Within functional limits for tasks assessed     SENSATION:   MUSCLE LENGTH:   POSTURE: forward neck, B protracted shoulders, L trunk side bend, movement preference around L4/L5 area, L weight shifting.   PALPATION: TTP R low back, increased B lumbar paraspinal muscle tension R > L  TTP R L5 > L4 > L3 TTP R low back > L side     LUMBAR ROM:   AROM eval  Flexion WFL, R posterior hip pain when returning to the upright position  Extension Very limited with R posterior hip pain  Right lateral flexion Limited with R lateral hip joint pain (worse than L lateral flexion)  Left lateral flexion Limited with R lateral hip pain  Right rotation [facility] with R posterior lateral hip pain worse than L rotation  Left rotation WFL with R posterior lateral hip pain.    (Blank rows = not tested)  LOWER EXTREMITY ROM:      Passive  Right eval Left eval  Hip flexion    Hip extension    Hip abduction    Hip adduction    Hip internal rotation    Hip external rotation    Knee flexion    Knee extension    Ankle dorsiflexion    Ankle plantarflexion    Ankle inversion    Ankle eversion     (Blank rows = not tested)  LOWER EXTREMITY MMT:    MMT Right eval Left eval  Hip flexion    Hip extension (seated manually resisted) 3+ 3+  Hip abduction (seated manually resisted) 4- 4-  Hip adduction    Hip internal rotation    Hip external rotation    Knee flexion 4 4  Knee extension 4+ 4+  Ankle dorsiflexion    Ankle plantarflexion    Ankle inversion    Ankle eversion     (Blank rows = not tested)  LUMBAR SPECIAL TESTS:    FUNCTIONAL TESTS:    GAIT: Distance walked: 30 ft Assistive device utilized: None Level of assistance: SBA Comments: Decreased stance R LE, antalgic with R trunk side bend during R LE stance phase, decreased B knee extension during stance phase, decreased foot clearance and gait velocity.   TREATMENT DATE: 04/22/2024  Neuromuscular Re-education   Seated transversus abdominis contraction 10x5 seconds   Seated glute max squeeze 10x5 seconds, then 5x5 seconds    Decreased back pain in sitting reported.   Improved exercise technique, movement at target joints, use of target muscles after mod verbal, visual, tactile cues.     PATIENT EDUCATION:  Education details: there-ex, HEP, POC Person educated: Patient Education method: Explanation, Demonstration, Tactile cues, Verbal cues, and Handouts Education comprehension: verbalized understanding and returned demonstration  HOME EXERCISE PROGRAM: Access Code: [email or web address] URL: [email or web address] Date: 04/22/2024 Prepared by: Suzzane Estes  Exercises - Seated Transversus Abdominis  Bracing  - 3 x daily - 7 x weekly - 3 sets - 10 reps - 5 seconds hold - Seated Gluteal Sets  - 3 x daily - 7 x weekly - 3 sets - 10 reps - 5 seconds hold     ASSESSMENT:  CLINICAL IMPRESSION: Patient is a 68 y.o. female who was seen today for physical therapy evaluation and treatment for low back pain. She also presents with altered gait pattern and posture, R LE radiating symptoms, TTP R > L low back, decreased trunk and B hip strength, reproduction of symptoms with lumbar AROM, and difficulty performing tasks which involve standing and walking and difficulty tolerating R S/L secondary to pain. Pt will benefit from skilled physical therapy services to address the aforementioned deficits.       OBJECTIVE IMPAIRMENTS: Abnormal gait, decreased balance, difficulty walking, decreased strength, improper body mechanics, postural dysfunction, and pain.   ACTIVITY LIMITATIONS: carrying, lifting, bending, sitting, standing, squatting, sleeping, stairs, transfers, bed mobility, dressing, hygiene/grooming, and locomotion level  PARTICIPATION LIMITATIONS:   PERSONAL FACTORS: Age, Fitness, Time since onset of injury/illness/exacerbation, and 3+ comorbidities: anxiety, arthritis, depression, DM, HTN, sleep apnea are also affecting patient's functional outcome.   REHAB POTENTIAL: Fair    CLINICAL DECISION MAKING: Evolving/moderate complexity Pain has worsened since onset per pt.   EVALUATION COMPLEXITY: Moderate   GOALS: Goals reviewed with patient? Yes  SHORT TERM GOALS: Target date: 05/07/2024  Pt will be independent with her initial HEP to decrease pain, improve strength, function, and ability to perform standing tasks more comfortably.  Baseline: Pt has started her initial HEP (04/22/2024) Goal status: INITIAL     LONG TERM GOALS: Target date: 06/18/2024  Pt will have a decrease in low back pain to 4/10 or less at worst to promote ability to perform standing tasks more comfortably.   Baseline:8/10 low back pain at worst for the past 3 months (04/22/2024)  Goal status: INITIAL  2.  Pt will have a decrease in R LE pain to 4/10 or less at worst to promote ability to ambulate and perform standing tasks more comfortably.  Baseline: 9/10 R LE pain at worst for the past 3 months. (04/22/2024) Goal status: INITIAL  3.  Pt will improve her Modified Oswestry Low Back Pain Disability Questionnaire by at least 20% as a demonstration of improved function.  Baseline: Modified Oswestry Low Back Pain Disability Questionnaire 62% (04/22/2024) Goal status: INITIAL  4.  Pt will improve her B hip extension and abduction strength by at least 1/2 MMT grade to promote ability to ambulate and perform standing tasks more comfortably.  Baseline:  MMT Right eval Left eval  Hip extension (seated manually resisted) 3+ 3+  Hip abduction (seated manually resisted) 4- 4-   (04/22/2024)  Goal status: INITIAL   PLAN:  PT FREQUENCY: 1-2x/week  PT DURATION: 8 weeks  PLANNED INTERVENTIONS: 97110-Therapeutic exercises, 97530- Therapeutic  activity, [id number]- Neuromuscular re-education, [phone]- Self Care, [id number]- Manual therapy, [phone]- Gait training, [phone]- Aquatic Therapy, [phone]- Electrical stimulation (unattended), [phone]- Traction (mechanical), [phone]- Ionotophoresis 4mg /ml Dexamethasone , Patient/Family education, Balance training, Stair training, Dry Needling, Joint mobilization, and Spinal mobilization.  PLAN FOR NEXT SESSION: Posture, trunk and glute strengthening, manual techniques, modalities PRN   Kaleia Longhi, PT, DPT 04/22/2024, 4:58 PM

## 2024-04-28 ENCOUNTER — Ambulatory Visit

## 2024-04-28 DIAGNOSIS — M5459 Other low back pain: Secondary | ICD-10-CM

## 2024-04-28 DIAGNOSIS — M5416 Radiculopathy, lumbar region: Secondary | ICD-10-CM

## 2024-04-28 DIAGNOSIS — R262 Difficulty in walking, not elsewhere classified: Secondary | ICD-10-CM

## 2024-04-28 NOTE — Therapy (Signed)
 OUTPATIENT PHYSICAL THERAPY TREATMENT   Patient Name: NEWELL HOFFART MRN: [id number] DOB:[birth year], 68 y.o., female Today's Date: 04/28/2024  END OF SESSION:  PT End of Session - 04/28/24 0820     Visit Number 2    Number of Visits 17    Date for PT Re-Evaluation 06/18/24    PT Start Time 0820    PT Stop Time 0902    PT Time Calculation (min) 42 min    Activity Tolerance Patient tolerated treatment well    Behavior During Therapy [facility] for tasks assessed/performed              Past Medical History:  Diagnosis Date   Anxiety    Arthritis    hand   Complication of anesthesia 03/18/2022   Hypoxia in pacu. See pacu note 03/18/2022. The hospitalist was consulted for further work-up.   Depression    Diabetes mellitus without complication (HCC)    Dyspnea    DOE   Hyperlipemia    Hypertension    Neuropathy    Sleep apnea    Past Surgical History:  Procedure Laterality Date   ABDOMINAL HYSTERECTOMY  1995   BIOPSY  07/10/2023   Procedure: BIOPSY;  Surgeon: Quintin Buckle, DO;  Location: [facility] ENDOSCOPY;  Service: Gastroenterology;;   CATARACT EXTRACTION W/PHACO Left 09/25/2017   Procedure: CATARACT EXTRACTION PHACO AND INTRAOCULAR LENS PLACEMENT (IOC);  Surgeon: Rosa College, MD;  Location: [facility] ORS;  Service: Ophthalmology;  Laterality: Left;   flui pack lot # [id number]  US  00:32.7 AP%   12.17 CDE   3.95   CATARACT EXTRACTION W/PHACO Right 10/16/2017   Procedure: CATARACT EXTRACTION PHACO AND INTRAOCULAR LENS PLACEMENT (IOC);  Surgeon: Rosa College, MD;  Location: [facility] ORS;  Service: Ophthalmology;  Laterality: Right;  US  00:32.8 AP% 9.0 CDE 2.96 FLUID PACK LOT # [id number]   COLONOSCOPY WITH PROPOFOL  N/A 05/27/2016   Procedure: COLONOSCOPY WITH PROPOFOL ;  Surgeon: Cassie Click, MD;  Location: [facility] ENDOSCOPY;  Service: Endoscopy;  Laterality: N/A;   COLONOSCOPY WITH PROPOFOL  N/A 07/10/2021   Procedure: COLONOSCOPY WITH PROPOFOL ;  Surgeon: Shane Darling, MD;  Location: [facility] ENDOSCOPY;  Service: Endoscopy;  Laterality: N/A;   COLONOSCOPY WITH PROPOFOL  N/A 10/30/2021   Procedure: COLONOSCOPY WITH PROPOFOL ;  Surgeon: Shane Darling, MD;  Location: [facility] ENDOSCOPY;  Service: Endoscopy;  Laterality: N/A;   COLONOSCOPY WITH PROPOFOL  N/A 02/05/2022   Procedure: COLONOSCOPY WITH PROPOFOL ;  Surgeon: Shane Darling, MD;  Location: [facility] ENDOSCOPY;  Service: Endoscopy;  Laterality: N/A;   COLONOSCOPY WITH PROPOFOL  N/A 07/10/2023   Procedure: COLONOSCOPY WITH PROPOFOL ;  Surgeon: Quintin Buckle, DO;  Location: [facility] ENDOSCOPY;  Service: Gastroenterology;  Laterality: N/A;   ESOPHAGOGASTRODUODENOSCOPY (EGD) WITH PROPOFOL  N/A 07/10/2021   Procedure: ESOPHAGOGASTRODUODENOSCOPY (EGD) WITH PROPOFOL ;  Surgeon: Shane Darling, MD;  Location: [facility] ENDOSCOPY;  Service: Endoscopy;  Laterality: N/A;  IDDM   ESOPHAGOGASTRODUODENOSCOPY (EGD) WITH PROPOFOL  N/A 10/30/2021   Procedure: ESOPHAGOGASTRODUODENOSCOPY (EGD) WITH PROPOFOL ;  Surgeon: Shane Darling, MD;  Location: [facility] ENDOSCOPY;  Service: Endoscopy;  Laterality: N/A;  IDDM   EYE SURGERY Left 09/25/2017   cataract extraction   HYSTERECTOMY SUPRACERVICAL ABDOMINAL W/REMOVAL TUBES &/OR OVARIES  1995   KENALOG  INJECTION Right 10/16/2017   Procedure: KENALOG  INJECTION;  Surgeon: Rosa College, MD;  Location: [facility] ORS;  Service: Ophthalmology;  Laterality: Right;   KNEE ARTHROCENTESIS Left 1981   KNEE ARTHROCENTESIS Bilateral 1992   right one in 1992   KNEE  ARTHROCENTESIS Bilateral    POLYPECTOMY  07/10/2023   Procedure: POLYPECTOMY;  Surgeon: Quintin Buckle, DO;  Location: [facility] ENDOSCOPY;  Service: Gastroenterology;;   XI ROBOTIC LAPAROSCOPIC ASSISTED APPENDECTOMY N/A 03/18/2022   Procedure: XI ROBOTIC LAPAROSCOPIC ASSISTED APPENDECTOMY;  Surgeon: Eldred Grego, MD;  Location: [facility] ORS;  Service: General;  Laterality: N/A;   Patient Active Problem List   Diagnosis  Date Noted   Hypoxia 03/18/2022   Hypertension    Diabetes mellitus without complication (HCC)    Depression    Anemia    History of laparoscopic appendectomy     PCP: Jefferey Minerva, PA   REFERRING PROVIDER: Tommas Fragmin Dipakkumar, MD  REFERRING DIAG: [phone] (ICD-10-CM) - Other intervertebral disc degeneration, lumbar region without mention of lumbar back pain or lower extremity pain  Rationale for Evaluation and Treatment: Rehabilitation  THERAPY DIAG:  Other low back pain  Radiculopathy, lumbar region  Difficulty in walking, not elsewhere classified  ONSET DATE: December 2024  SUBJECTIVE:                                                                                                                                                                                           SUBJECTIVE STATEMENT: Fell off the bed the morning after her eval at 1 am. Thinks she got a bruise in her back which is tender. Pt his her R low back onto the step stool for her dogs to get to her bed. 8/10 low back pain currently. Has been doing her exercises. Does not feel like she broke anything. Just feels bruised.    PERTINENT HISTORY:  Low back pain. Currently wearing a back brace which is not helping. Pt states having a bulging disc. Pain started December 2024, sudden onset, unknown mechanism of injury. Pt states having R Sciatic nerve in R posterior hip pain initially. Had x-ryas done, then and MRI which revealed bulging disc. Pain radiates along the R L5 dermatome as well as sciatic nerve (to superficial peroneal nerve distribution) to dorsal foot. R anterior ankle hurts. Pain has worsened since January 2025. Surgeon wants pt to do PT.      No latex allergies   Had L eye surgery 03/17/2024, pt states she is clear to participate in PT. 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  Blood pressure is controlled per pt.  DM is controlled per pt.  Has a CPAP machine.     PAIN:  Are you having pain?  Yes: NPRS scale: 8/10 Pain location: Low back and R L5 dermatome and R sciatic nerve distribution to R dorsal foot.  Pain description: Low back: pressure; R  LE: ache, tight Aggravating factors: Sleeping on her R side, standing and walking Relieving factors: leaning to the L, sleeping on her L side, leaning forward  PRECAUTIONS: Cannot see with L eye.     RED FLAGS: Bowel or bladder incontinence: No and Cauda equina syndrome: No   WEIGHT BEARING RESTRICTIONS: No  FALLS:  Has patient fallen in last 6 months? No  LIVING ENVIRONMENT: Lives with: lives with their spouse Lives in: House/apartment Stairs: Yes: Internal: 2 steps; none and External: 0 steps; none Has following equipment at home: Single point cane  OCCUPATION: Housewife  PLOF: Independent  PATIENT GOALS: decrease pain  NEXT MD VISIT: none at the moment  OBJECTIVE:  Note: Objective measures were completed at Evaluation unless otherwise noted.  DIAGNOSTIC FINDINGS:    PATIENT SURVEYS:  Modified Oswestry Low Back Pain Disability Questionnaire 62% (04/22/2024)  COGNITION: Overall cognitive status: Within functional limits for tasks assessed     SENSATION:   MUSCLE LENGTH:   POSTURE: forward neck, B protracted shoulders, L trunk side bend, movement preference around L4/L5 area, L weight shifting.   PALPATION: TTP R low back, increased B lumbar paraspinal muscle tension R > L  TTP R L5 > L4 > L3 TTP R low back > L side     LUMBAR ROM:   AROM eval  Flexion WFL, R posterior hip pain when returning to the upright position  Extension Very limited with R posterior hip pain  Right lateral flexion Limited with R lateral hip joint pain (worse than L lateral flexion)  Left lateral flexion Limited with R lateral hip pain  Right rotation [facility] with R posterior lateral hip pain worse than L rotation  Left rotation WFL with R posterior lateral hip pain.    (Blank rows = not tested)  LOWER EXTREMITY ROM:      Passive  Right eval Left eval  Hip flexion    Hip extension    Hip abduction    Hip adduction    Hip internal rotation    Hip external rotation    Knee flexion    Knee extension    Ankle dorsiflexion    Ankle plantarflexion    Ankle inversion    Ankle eversion     (Blank rows = not tested)  LOWER EXTREMITY MMT:    MMT Right eval Left eval  Hip flexion    Hip extension (seated manually resisted) 3+ 3+  Hip abduction (seated manually resisted) 4- 4-  Hip adduction    Hip internal rotation    Hip external rotation    Knee flexion 4 4  Knee extension 4+ 4+  Ankle dorsiflexion    Ankle plantarflexion    Ankle inversion    Ankle eversion     (Blank rows = not tested)  LUMBAR SPECIAL TESTS:    FUNCTIONAL TESTS:    GAIT: Distance walked: 30 ft Assistive device utilized: None Level of assistance: SBA Comments: Decreased stance R LE, antalgic with R trunk side bend during R LE stance phase, decreased B knee extension during stance phase, decreased foot clearance and gait velocity.   TREATMENT DATE: 04/28/2024  Neuromuscular Re-education   Bruise R low back observed   Reclined   Hooklying       transversus abdominis contraction 10x5 seconds for 2 sets      B scapular retraction 10x3 with 5 second holds to promote thoracic extension and decrease stress to low back      Chin tuck 10x5 seconds for 2 sets to promote  thoracic extension and decrease stress to low back    Glute max squeeze 10x5 seconds for 2 sets    B shoulder flexion yellow band 7x5 seconds     Middle low back discomfort, eases with rest.   Seated hip adduction folded pillow squeeze 4x5 seconds. R low back discomfort.   Seated B ankle DF/PF 10x2 to promote LE neural flossing  Improved exercise technique, movement at target joints, use of target muscles after mod  verbal, visual, tactile cues.     PATIENT EDUCATION:  Education details: there-ex, HEP, POC Person educated: Patient Education method: Explanation, Demonstration, Tactile cues, Verbal cues, and Handouts Education comprehension: verbalized understanding and returned demonstration  HOME EXERCISE PROGRAM: Access Code: [email or web address] URL: [email or web address] Date: 04/22/2024 Prepared by: Suzzane Estes  Exercises - Seated Transversus Abdominis Bracing  - 3 x daily - 7 x weekly - 3 sets - 10 reps - 5 seconds hold - Seated Gluteal Sets  - 3 x daily - 7 x weekly - 3 sets - 10 reps - 5 seconds hold     ASSESSMENT:  CLINICAL IMPRESSION: Worked on gentle thoracic extension, and gentle trunk strengthening today to help decrease stress to low back. Decreased back pain reported after session. Recommended pt to use ice at home to help with comfort level and bruising to low back. If R low back pain worsens pt was recommended to contact her doctor. Pt verbalized understanding. Pt will benefit from continued skilled physical therapy services to decrease pain, improve strength and function.       OBJECTIVE IMPAIRMENTS: Abnormal gait, decreased balance, difficulty walking, decreased strength, improper body mechanics, postural dysfunction, and pain.   ACTIVITY LIMITATIONS: carrying, lifting, bending, sitting, standing, squatting, sleeping, stairs, transfers, bed mobility, dressing, hygiene/grooming, and locomotion level  PARTICIPATION LIMITATIONS:   PERSONAL FACTORS: Age, Fitness, Time since onset of injury/illness/exacerbation, and 3+ comorbidities: anxiety, arthritis, depression, DM, HTN, sleep apnea are also affecting patient's functional outcome.   REHAB POTENTIAL: Fair    CLINICAL DECISION MAKING: Evolving/moderate complexity Pain has worsened since onset per pt.   EVALUATION COMPLEXITY: Moderate   GOALS: Goals reviewed with patient? Yes  SHORT TERM GOALS: Target date:  05/07/2024  Pt will be independent with her initial HEP to decrease pain, improve strength, function, and ability to perform standing tasks more comfortably.  Baseline: Pt has started her initial HEP (04/22/2024) Goal status: INITIAL     LONG TERM GOALS: Target date: 06/18/2024  Pt will have a decrease in low back pain to 4/10 or less at worst to promote ability to perform standing tasks more comfortably.  Baseline:8/10 low back pain at worst for the past 3 months (04/22/2024)  Goal status: INITIAL  2.  Pt will have a decrease in R LE pain to 4/10 or less at worst to promote ability to ambulate and perform standing tasks more comfortably.  Baseline: 9/10 R LE pain at worst for the past 3 months. (04/22/2024) Goal status: INITIAL  3.  Pt will improve her Modified Oswestry Low Back Pain Disability Questionnaire by at least 20% as a demonstration of improved function.  Baseline: Modified Oswestry Low Back Pain Disability Questionnaire 62% (04/22/2024) Goal status: INITIAL  4.  Pt will improve her B hip extension and abduction strength by at least 1/2 MMT grade to promote ability to ambulate and perform standing tasks more comfortably.  Baseline:  MMT Right eval Left eval  Hip extension (seated manually resisted) 3+ 3+  Hip abduction (seated manually resisted) 4- 4-   (04/22/2024)  Goal status: INITIAL   PLAN:  PT FREQUENCY: 1-2x/week  PT DURATION: 8 weeks  PLANNED INTERVENTIONS: 97110-Therapeutic exercises, 97530- Therapeutic activity, 97112- Neuromuscular re-education, 97535- Self Care, 16109- Manual therapy, [phone]- Gait training, [phone]- Aquatic Therapy, [phone]- Electrical stimulation (unattended), [phone]- Traction (mechanical), [id number]- Ionotophoresis 4mg /ml Dexamethasone , Patient/Family education, Balance training, Stair training, Dry Needling, Joint mobilization, and Spinal mobilization.  PLAN FOR NEXT SESSION: Posture, trunk and glute strengthening, manual techniques, modalities  PRN   Damien Batty, PT, DPT 04/28/2024, 9:06 AM

## 2024-05-05 ENCOUNTER — Ambulatory Visit

## 2024-05-05 DIAGNOSIS — M5459 Other low back pain: Secondary | ICD-10-CM

## 2024-05-05 DIAGNOSIS — M5416 Radiculopathy, lumbar region: Secondary | ICD-10-CM

## 2024-05-05 DIAGNOSIS — R262 Difficulty in walking, not elsewhere classified: Secondary | ICD-10-CM

## 2024-05-05 NOTE — Therapy (Signed)
 OUTPATIENT PHYSICAL THERAPY TREATMENT   Patient Name: Emma White MRN: [id number] DOB:1956/03/02, 68 y.o., female Today's Date: 05/05/2024  END OF SESSION:  PT End of Session - 05/05/24 0955     Visit Number 3    Number of Visits 17    Date for PT Re-Evaluation 06/18/24    PT Start Time 0955    PT Stop Time 1038    PT Time Calculation (min) 43 min    Activity Tolerance Patient tolerated treatment well    Behavior During Therapy [facility] for tasks assessed/performed               Past Medical History:  Diagnosis Date   Anxiety    Arthritis    hand   Complication of anesthesia 03/18/2022   Hypoxia in pacu. See pacu note 03/18/2022. The hospitalist was consulted for further work-up.   Depression    Diabetes mellitus without complication (HCC)    Dyspnea    DOE   Hyperlipemia    Hypertension    Neuropathy    Sleep apnea    Past Surgical History:  Procedure Laterality Date   ABDOMINAL HYSTERECTOMY  1995   BIOPSY  07/10/2023   Procedure: BIOPSY;  Surgeon: Quintin Buckle, DO;  Location: [facility] ENDOSCOPY;  Service: Gastroenterology;;   CATARACT EXTRACTION W/PHACO Left 09/25/2017   Procedure: CATARACT EXTRACTION PHACO AND INTRAOCULAR LENS PLACEMENT (IOC);  Surgeon: Rosa College, MD;  Location: [facility] ORS;  Service: Ophthalmology;  Laterality: Left;   flui pack lot # [id number]  US  00:32.7 AP%   12.17 CDE   3.95   CATARACT EXTRACTION W/PHACO Right 10/16/2017   Procedure: CATARACT EXTRACTION PHACO AND INTRAOCULAR LENS PLACEMENT (IOC);  Surgeon: Rosa College, MD;  Location: [facility] ORS;  Service: Ophthalmology;  Laterality: Right;  US  00:32.8 AP% 9.0 CDE 2.96 FLUID PACK LOT # [id number]   COLONOSCOPY WITH PROPOFOL  N/A 05/27/2016   Procedure: COLONOSCOPY WITH PROPOFOL ;  Surgeon: Cassie Click, MD;  Location: [facility] ENDOSCOPY;  Service: Endoscopy;  Laterality: N/A;   COLONOSCOPY WITH PROPOFOL  N/A 07/10/2021   Procedure: COLONOSCOPY WITH PROPOFOL ;  Surgeon:  Shane Darling, MD;  Location: [facility] ENDOSCOPY;  Service: Endoscopy;  Laterality: N/A;   COLONOSCOPY WITH PROPOFOL  N/A 10/30/2021   Procedure: COLONOSCOPY WITH PROPOFOL ;  Surgeon: Shane Darling, MD;  Location: [facility] ENDOSCOPY;  Service: Endoscopy;  Laterality: N/A;   COLONOSCOPY WITH PROPOFOL  N/A 02/05/2022   Procedure: COLONOSCOPY WITH PROPOFOL ;  Surgeon: Shane Darling, MD;  Location: [facility] ENDOSCOPY;  Service: Endoscopy;  Laterality: N/A;   COLONOSCOPY WITH PROPOFOL  N/A 07/10/2023   Procedure: COLONOSCOPY WITH PROPOFOL ;  Surgeon: Quintin Buckle, DO;  Location: [facility] ENDOSCOPY;  Service: Gastroenterology;  Laterality: N/A;   ESOPHAGOGASTRODUODENOSCOPY (EGD) WITH PROPOFOL  N/A 07/10/2021   Procedure: ESOPHAGOGASTRODUODENOSCOPY (EGD) WITH PROPOFOL ;  Surgeon: Shane Darling, MD;  Location: [facility] ENDOSCOPY;  Service: Endoscopy;  Laterality: N/A;  IDDM   ESOPHAGOGASTRODUODENOSCOPY (EGD) WITH PROPOFOL  N/A 10/30/2021   Procedure: ESOPHAGOGASTRODUODENOSCOPY (EGD) WITH PROPOFOL ;  Surgeon: Shane Darling, MD;  Location: [facility] ENDOSCOPY;  Service: Endoscopy;  Laterality: N/A;  IDDM   EYE SURGERY Left 09/25/2017   cataract extraction   HYSTERECTOMY SUPRACERVICAL ABDOMINAL W/REMOVAL TUBES &/OR OVARIES  1995   KENALOG  INJECTION Right 10/16/2017   Procedure: KENALOG  INJECTION;  Surgeon: Rosa College, MD;  Location: [facility] ORS;  Service: Ophthalmology;  Laterality: Right;   KNEE ARTHROCENTESIS Left 1981   KNEE ARTHROCENTESIS Bilateral 1992   right one in 1992  KNEE ARTHROCENTESIS Bilateral    POLYPECTOMY  07/10/2023   Procedure: POLYPECTOMY;  Surgeon: Quintin Buckle, DO;  Location: [facility] ENDOSCOPY;  Service: Gastroenterology;;   XI ROBOTIC LAPAROSCOPIC ASSISTED APPENDECTOMY N/A 03/18/2022   Procedure: XI ROBOTIC LAPAROSCOPIC ASSISTED APPENDECTOMY;  Surgeon: Eldred Grego, MD;  Location: [facility] ORS;  Service: General;  Laterality: N/A;   Patient Active Problem List    Diagnosis Date Noted   Hypoxia 03/18/2022   Hypertension    Diabetes mellitus without complication (HCC)    Depression    Anemia    History of laparoscopic appendectomy     PCP: Jefferey Minerva, PA   REFERRING PROVIDER: Tommas Fragmin Dipakkumar, MD  REFERRING DIAG: [phone] (ICD-10-CM) - Other intervertebral disc degeneration, lumbar region without mention of lumbar back pain or lower extremity pain  Rationale for Evaluation and Treatment: Rehabilitation  THERAPY DIAG:  Other low back pain  Radiculopathy, lumbar region  Difficulty in walking, not elsewhere classified  ONSET DATE: December 2024  SUBJECTIVE:                                                                                                                                                                                           SUBJECTIVE STATEMENT: Back is still the same. Had 5 teeth pulled this Monday from the dentist. Did not do anything since then. Just rested. Did not get sleep last night. Also had pain R anterior shin and dorsal foot. Has to sleep on L side. 7/10 low back pain currently. Back feels fine after the fall. Still hurts in her R butt cheek from the sciatic nerve.      PERTINENT HISTORY:  Low back pain. Currently wearing a back brace which is not helping. Pt states having a bulging disc. Pain started December 2024, sudden onset, unknown mechanism of injury. Pt states having R Sciatic nerve in R posterior hip pain initially. Had x-ryas done, then and MRI which revealed bulging disc. Pain radiates along the R L5 dermatome as well as sciatic nerve (to superficial peroneal nerve distribution) to dorsal foot. R anterior ankle hurts. Pain has worsened since January 2025. Surgeon wants pt to do PT.      No latex allergies   Had L eye surgery 03/17/2024, pt states she is clear to participate in PT. 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  Blood pressure is controlled per pt.  DM is controlled per pt.  Has a  CPAP machine.     PAIN:  Are you having pain? Yes: NPRS scale: 8/10 Pain location: Low back and R L5 dermatome and R sciatic nerve distribution to R dorsal foot.  Pain description: Low back: pressure; R LE: ache, tight Aggravating factors: Sleeping on her R side, standing and walking Relieving factors: leaning to the L, sleeping on her L side, leaning forward  PRECAUTIONS: Cannot see with L eye.     RED FLAGS: Bowel or bladder incontinence: No and Cauda equina syndrome: No   WEIGHT BEARING RESTRICTIONS: No  FALLS:  Has patient fallen in last 6 months? No  LIVING ENVIRONMENT: Lives with: lives with their spouse Lives in: House/apartment Stairs: Yes: Internal: 2 steps; none and External: 0 steps; none Has following equipment at home: Single point cane  OCCUPATION: Housewife  PLOF: Independent  PATIENT GOALS: decrease pain  NEXT MD VISIT: none at the moment  OBJECTIVE:  Note: Objective measures were completed at Evaluation unless otherwise noted.  DIAGNOSTIC FINDINGS:    PATIENT SURVEYS:  Modified Oswestry Low Back Pain Disability Questionnaire 62% (04/22/2024)  COGNITION: Overall cognitive status: Within functional limits for tasks assessed     SENSATION:   MUSCLE LENGTH:   POSTURE: forward neck, B protracted shoulders, L trunk side bend, movement preference around L4/L5 area, L weight shifting.   PALPATION: TTP R low back, increased B lumbar paraspinal muscle tension R > L  TTP R L5 > L4 > L3 TTP R low back > L side     LUMBAR ROM:   AROM eval  Flexion WFL, R posterior hip pain when returning to the upright position  Extension Very limited with R posterior hip pain  Right lateral flexion Limited with R lateral hip joint pain (worse than L lateral flexion)  Left lateral flexion Limited with R lateral hip pain  Right rotation [facility] with R posterior lateral hip pain worse than L rotation  Left rotation WFL with R posterior lateral hip pain.    (Blank  rows = not tested)  LOWER EXTREMITY ROM:     Passive  Right eval Left eval  Hip flexion    Hip extension    Hip abduction    Hip adduction    Hip internal rotation    Hip external rotation    Knee flexion    Knee extension    Ankle dorsiflexion    Ankle plantarflexion    Ankle inversion    Ankle eversion     (Blank rows = not tested)  LOWER EXTREMITY MMT:    MMT Right eval Left eval  Hip flexion    Hip extension (seated manually resisted) 3+ 3+  Hip abduction (seated manually resisted) 4- 4-  Hip adduction    Hip internal rotation    Hip external rotation    Knee flexion 4 4  Knee extension 4+ 4+  Ankle dorsiflexion    Ankle plantarflexion    Ankle inversion    Ankle eversion     (Blank rows = not tested)  LUMBAR SPECIAL TESTS:    FUNCTIONAL TESTS:    GAIT: Distance walked: 30 ft Assistive device utilized: None Level of assistance: SBA Comments: Decreased stance R LE, antalgic with R trunk side bend during R LE stance phase, decreased B knee extension during stance phase, decreased foot clearance and gait velocity.   TREATMENT DATE: 05/05/2024  Neuromuscular Re-education   Hooklying (comfortable for her posterior hips)      transversus abdominis contraction 10x5 seconds for 2 sets   Posterior pelvic tilt 10x5 seconds    Increased central low back symptoms, eases with rest.    B scapular retraction 10x with 5 second holds to promote thoracic extension and decrease stress to low back   Then with chin tucks 10x5 seconds for 2 sets   SKTC    R 30 seconds x 3   Hip extension isometrics with leg straight   R 10x5 seconds     Then in slight reclined positoin for R posterior hip comfort 10x5 seconds for 2 set     L 4x5 seconds     R posterior hip discomfort  Back feels good in supine and sitting after aforementioned  exercises, no pain reported.   Decreased low back pain, but still has R posterior hip discomfort during gait after session.     Improved technique, movement at target joints, use of target muscles after mod verbal, visual, tactile cues.     PATIENT EDUCATION:  Education details: there-ex, HEP, POC Person educated: Patient Education method: Explanation, Demonstration, Tactile cues, Verbal cues, and Handouts Education comprehension: verbalized understanding and returned demonstration  HOME EXERCISE PROGRAM: Access Code: [email or web address] URL: [email or web address] Date: 04/22/2024 Prepared by: Suzzane Estes  Exercises - Seated Transversus Abdominis Bracing  - 3 x daily - 7 x weekly - 3 sets - 10 reps - 5 seconds hold - Seated Gluteal Sets  - 3 x daily - 7 x weekly - 3 sets - 10 reps - 5 seconds hold   Reclined, hooklying hip extension isometrics with leg straight  R 10x5 seconds for 3 sets daily.  Drawing provided.       ASSESSMENT:  CLINICAL IMPRESSION: Continued working on trunk and glute strength to decrease stress to R low back. Decreased low back and posterior hip discomfort in supine and sitting after R glute max strengthening and R SKTC. [name] Pt will benefit from continued skilled physical therapy services to decrease pain, improve strength and function.       OBJECTIVE IMPAIRMENTS: Abnormal gait, decreased balance, difficulty walking, decreased strength, improper body mechanics, postural dysfunction, and pain.   ACTIVITY LIMITATIONS: carrying, lifting, bending, sitting, standing, squatting, sleeping, stairs, transfers, bed mobility, dressing, hygiene/grooming, and locomotion level  PARTICIPATION LIMITATIONS:   PERSONAL FACTORS: Age, Fitness, Time since onset of injury/illness/exacerbation, and 3+ comorbidities: anxiety, arthritis, depression, DM, HTN, sleep apnea are also affecting patient's functional outcome.   REHAB POTENTIAL: Fair    CLINICAL DECISION  MAKING: Evolving/moderate complexity Pain has worsened since onset per pt.   EVALUATION COMPLEXITY: Moderate   GOALS: Goals reviewed with patient? Yes  SHORT TERM GOALS: Target date: 05/07/2024  Pt will be independent with her initial HEP to decrease pain, improve strength, function, and ability to perform standing tasks more comfortably.  Baseline: Pt has started her initial HEP (04/22/2024) Goal status: INITIAL     LONG TERM GOALS: Target date: 06/18/2024  Pt will have a decrease in low back pain to 4/10 or less at worst to promote ability to perform standing tasks more comfortably.  Baseline:8/10 low back pain at worst for the past 3 months (04/22/2024)  Goal status: INITIAL  2.  Pt will have a decrease in R LE pain to 4/10 or less at worst to promote ability to ambulate and perform standing tasks more comfortably.  Baseline: 9/10 R LE pain at worst for the  past 3 months. (04/22/2024) Goal status: INITIAL  3.  Pt will improve her Modified Oswestry Low Back Pain Disability Questionnaire by at least 20% as a demonstration of improved function.  Baseline: Modified Oswestry Low Back Pain Disability Questionnaire 62% (04/22/2024) Goal status: INITIAL  4.  Pt will improve her B hip extension and abduction strength by at least 1/2 MMT grade to promote ability to ambulate and perform standing tasks more comfortably.  Baseline:  MMT Right eval Left eval  Hip extension (seated manually resisted) 3+ 3+  Hip abduction (seated manually resisted) 4- 4-   (04/22/2024)  Goal status: INITIAL   PLAN:  PT FREQUENCY: 1-2x/week  PT DURATION: 8 weeks  PLANNED INTERVENTIONS: 97110-Therapeutic exercises, 97530- Therapeutic activity, 97112- Neuromuscular re-education, 97535- Self Care, 40981- Manual therapy, [phone]- Gait training, [phone]- Aquatic Therapy, [phone]- Electrical stimulation (unattended), [phone]- Traction (mechanical), [id number]- Ionotophoresis 4mg /ml Dexamethasone , Patient/Family education,  Balance training, Stair training, Dry Needling, Joint mobilization, and Spinal mobilization.  PLAN FOR NEXT SESSION: Posture, trunk and glute strengthening, manual techniques, modalities PRN   Seniyah Esker, PT, DPT 05/05/2024, 10:47 AM

## 2024-05-18 ENCOUNTER — Telehealth: Payer: Self-pay

## 2024-05-18 ENCOUNTER — Ambulatory Visit

## 2024-05-18 NOTE — Telephone Encounter (Signed)
 No show. Called pt phone number which was her daughter's who said that pt never answers the phone and she put her number there for pt. Daughter said that pt thought her appointment was for 1:15 pm. Appointment was rescheduled for tomorrow, June 4 at 9:45 am.

## 2024-05-19 ENCOUNTER — Ambulatory Visit: Attending: Neurological Surgery

## 2024-05-19 DIAGNOSIS — M5459 Other low back pain: Secondary | ICD-10-CM | POA: Diagnosis present

## 2024-05-19 DIAGNOSIS — R262 Difficulty in walking, not elsewhere classified: Secondary | ICD-10-CM | POA: Insufficient documentation

## 2024-05-19 DIAGNOSIS — M5416 Radiculopathy, lumbar region: Secondary | ICD-10-CM | POA: Diagnosis present

## 2024-05-19 NOTE — Therapy (Signed)
 OUTPATIENT PHYSICAL THERAPY TREATMENT   Patient Name: Emma White MRN: [id number] DOB:Feb 07, 1956, 68 y.o., female Today's Date: 05/19/2024  END OF SESSION:  PT End of Session - 05/19/24 0945     Visit Number 4    Number of Visits 17    Date for PT Re-Evaluation 06/18/24    PT Start Time 0945    PT Stop Time 1027    PT Time Calculation (min) 42 min    Activity Tolerance Patient tolerated treatment well    Behavior During Therapy [facility] for tasks assessed/performed                Past Medical History:  Diagnosis Date   Anxiety    Arthritis    hand   Complication of anesthesia 03/18/2022   Hypoxia in pacu. See pacu note 03/18/2022. The hospitalist was consulted for further work-up.   Depression    Diabetes mellitus without complication (HCC)    Dyspnea    DOE   Hyperlipemia    Hypertension    Neuropathy    Sleep apnea    Past Surgical History:  Procedure Laterality Date   ABDOMINAL HYSTERECTOMY  1995   BIOPSY  07/10/2023   Procedure: BIOPSY;  Surgeon: Quintin Buckle, DO;  Location: [facility] ENDOSCOPY;  Service: Gastroenterology;;   CATARACT EXTRACTION W/PHACO Left 09/25/2017   Procedure: CATARACT EXTRACTION PHACO AND INTRAOCULAR LENS PLACEMENT (IOC);  Surgeon: Rosa College, MD;  Location: [facility] ORS;  Service: Ophthalmology;  Laterality: Left;   flui pack lot # [id number]  US  00:32.7 AP%   12.17 CDE   3.95   CATARACT EXTRACTION W/PHACO Right 10/16/2017   Procedure: CATARACT EXTRACTION PHACO AND INTRAOCULAR LENS PLACEMENT (IOC);  Surgeon: Rosa College, MD;  Location: [facility] ORS;  Service: Ophthalmology;  Laterality: Right;  US  00:32.8 AP% 9.0 CDE 2.96 FLUID PACK LOT # [id number]   COLONOSCOPY WITH PROPOFOL  N/A 05/27/2016   Procedure: COLONOSCOPY WITH PROPOFOL ;  Surgeon: Cassie Click, MD;  Location: [facility] ENDOSCOPY;  Service: Endoscopy;  Laterality: N/A;   COLONOSCOPY WITH PROPOFOL  N/A 07/10/2021   Procedure: COLONOSCOPY WITH PROPOFOL ;  Surgeon:  Shane Darling, MD;  Location: [facility] ENDOSCOPY;  Service: Endoscopy;  Laterality: N/A;   COLONOSCOPY WITH PROPOFOL  N/A 10/30/2021   Procedure: COLONOSCOPY WITH PROPOFOL ;  Surgeon: Shane Darling, MD;  Location: [facility] ENDOSCOPY;  Service: Endoscopy;  Laterality: N/A;   COLONOSCOPY WITH PROPOFOL  N/A 02/05/2022   Procedure: COLONOSCOPY WITH PROPOFOL ;  Surgeon: Shane Darling, MD;  Location: [facility] ENDOSCOPY;  Service: Endoscopy;  Laterality: N/A;   COLONOSCOPY WITH PROPOFOL  N/A 07/10/2023   Procedure: COLONOSCOPY WITH PROPOFOL ;  Surgeon: Quintin Buckle, DO;  Location: [facility] ENDOSCOPY;  Service: Gastroenterology;  Laterality: N/A;   ESOPHAGOGASTRODUODENOSCOPY (EGD) WITH PROPOFOL  N/A 07/10/2021   Procedure: ESOPHAGOGASTRODUODENOSCOPY (EGD) WITH PROPOFOL ;  Surgeon: Shane Darling, MD;  Location: [facility] ENDOSCOPY;  Service: Endoscopy;  Laterality: N/A;  IDDM   ESOPHAGOGASTRODUODENOSCOPY (EGD) WITH PROPOFOL  N/A 10/30/2021   Procedure: ESOPHAGOGASTRODUODENOSCOPY (EGD) WITH PROPOFOL ;  Surgeon: Shane Darling, MD;  Location: [facility] ENDOSCOPY;  Service: Endoscopy;  Laterality: N/A;  IDDM   EYE SURGERY Left 09/25/2017   cataract extraction   HYSTERECTOMY SUPRACERVICAL ABDOMINAL W/REMOVAL TUBES &/OR OVARIES  1995   KENALOG  INJECTION Right 10/16/2017   Procedure: KENALOG  INJECTION;  Surgeon: Rosa College, MD;  Location: [facility] ORS;  Service: Ophthalmology;  Laterality: Right;   KNEE ARTHROCENTESIS Left 1981   KNEE ARTHROCENTESIS Bilateral 1992   right one in 1992  KNEE ARTHROCENTESIS Bilateral    POLYPECTOMY  07/10/2023   Procedure: POLYPECTOMY;  Surgeon: Quintin Buckle, DO;  Location: [facility] ENDOSCOPY;  Service: Gastroenterology;;   XI ROBOTIC LAPAROSCOPIC ASSISTED APPENDECTOMY N/A 03/18/2022   Procedure: XI ROBOTIC LAPAROSCOPIC ASSISTED APPENDECTOMY;  Surgeon: Eldred Grego, MD;  Location: [facility] ORS;  Service: General;  Laterality: N/A;   Patient Active Problem List    Diagnosis Date Noted   Hypoxia 03/18/2022   Hypertension    Diabetes mellitus without complication (HCC)    Depression    Anemia    History of laparoscopic appendectomy     PCP: Jefferey Minerva, PA   REFERRING PROVIDER: Tommas Fragmin Dipakkumar, MD  REFERRING DIAG: [phone] (ICD-10-CM) - Other intervertebral disc degeneration, lumbar region without mention of lumbar back pain or lower extremity pain  Rationale for Evaluation and Treatment: Rehabilitation  THERAPY DIAG:  Other low back pain  Radiculopathy, lumbar region  Difficulty in walking, not elsewhere classified  ONSET DATE: December 2024  SUBJECTIVE:                                                                                                                                                                                           SUBJECTIVE STATEMENT:  Low back was doing pretty good until pt vacuumed her floor yesterday and hurt her back including the L low back and R low back. Can't stand and wash dishes due to back pain. 7/10 R low back pain currently. Her back brace helps.     PERTINENT HISTORY:  Low back pain. Currently wearing a back brace which is not helping. Pt states having a bulging disc. Pain started December 2024, sudden onset, unknown mechanism of injury. Pt states having R Sciatic nerve in R posterior hip pain initially. Had x-ryas done, then and MRI which revealed bulging disc. Pain radiates along the R L5 dermatome as well as sciatic nerve (to superficial peroneal nerve distribution) to dorsal foot. R anterior ankle hurts. Pain has worsened since January 2025. Surgeon wants pt to do PT.      No latex allergies   Had L eye surgery 03/17/2024, pt states she is clear to participate in PT. 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  Blood pressure is controlled per pt.  DM is controlled per pt.  Has a CPAP machine.     PAIN:  Are you having pain? Yes: NPRS scale: 8/10 Pain location: Low back and R L5  dermatome and R sciatic nerve distribution to R dorsal foot.  Pain description: Low back: pressure; R LE: ache, tight Aggravating factors: Sleeping on her R side, standing and walking Relieving  factors: leaning to the L, sleeping on her L side, leaning forward, wearing her back brace  PRECAUTIONS: Cannot see with L eye.     RED FLAGS: Bowel or bladder incontinence: No and Cauda equina syndrome: No   WEIGHT BEARING RESTRICTIONS: No  FALLS:  Has patient fallen in last 6 months? No  LIVING ENVIRONMENT: Lives with: lives with their spouse Lives in: House/apartment Stairs: Yes: Internal: 2 steps; none and External: 0 steps; none Has following equipment at home: Single point cane  OCCUPATION: Housewife  PLOF: Independent  PATIENT GOALS: decrease pain  NEXT MD VISIT: none at the moment  OBJECTIVE:  Note: Objective measures were completed at Evaluation unless otherwise noted.  DIAGNOSTIC FINDINGS:    PATIENT SURVEYS:  Modified Oswestry Low Back Pain Disability Questionnaire 62% (04/22/2024)  COGNITION: Overall cognitive status: Within functional limits for tasks assessed     SENSATION:   MUSCLE LENGTH:   POSTURE: forward neck, B protracted shoulders, L trunk side bend, movement preference around L4/L5 area, L weight shifting.   PALPATION: TTP R low back, increased B lumbar paraspinal muscle tension R > L  TTP R L5 > L4 > L3 TTP R low back > L side     LUMBAR ROM:   AROM eval  Flexion WFL, R posterior hip pain when returning to the upright position  Extension Very limited with R posterior hip pain  Right lateral flexion Limited with R lateral hip joint pain (worse than L lateral flexion)  Left lateral flexion Limited with R lateral hip pain  Right rotation [facility] with R posterior lateral hip pain worse than L rotation  Left rotation WFL with R posterior lateral hip pain.    (Blank rows = not tested)  LOWER EXTREMITY ROM:     Passive  Right eval Left eval   Hip flexion    Hip extension    Hip abduction    Hip adduction    Hip internal rotation    Hip external rotation    Knee flexion    Knee extension    Ankle dorsiflexion    Ankle plantarflexion    Ankle inversion    Ankle eversion     (Blank rows = not tested)  LOWER EXTREMITY MMT:    MMT Right eval Left eval  Hip flexion    Hip extension (seated manually resisted) 3+ 3+  Hip abduction (seated manually resisted) 4- 4-  Hip adduction    Hip internal rotation    Hip external rotation    Knee flexion 4 4  Knee extension 4+ 4+  Ankle dorsiflexion    Ankle plantarflexion    Ankle inversion    Ankle eversion     (Blank rows = not tested)  LUMBAR SPECIAL TESTS:    FUNCTIONAL TESTS:    GAIT: Distance walked: 30 ft Assistive device utilized: None Level of assistance: SBA Comments: Decreased stance R LE, antalgic with R trunk side bend during R LE stance phase, decreased B knee extension during stance phase, decreased foot clearance and gait velocity.   TREATMENT DATE: 05/19/2024  Neuromuscular Re-education   Sitting with upright posture and transversus abdominis contraction    Manually resisted R upper trunk rotation isometrics in neutral. 5 seconds x 2. Low back pain reported.    Manually resisted lateral shift isometrics in neutral    R 10x5 seconds for 3 sets   L 10x5 seconds for 3 sets   Manually resisted trunk flexion isometrics in neutral 10x5 seconds for 3 sets   Rest breaks provided secondary to fatigue   Improved exercise technique, movement at target joints, use of target muscles after mod verbal, visual, tactile cues.    Manual therapy  Seated STM to R lumbar paraspinal muscles to decrease tension   Decreased back pain to 6/10 afterwards.     PATIENT EDUCATION:  Education details: there-ex, HEP, POC Person educated:  Patient Education method: Explanation, Demonstration, Tactile cues, Verbal cues, and Handouts Education comprehension: verbalized understanding and returned demonstration  HOME EXERCISE PROGRAM: Access Code: [email or web address] URL: [email or web address] Date: 04/22/2024 Prepared by: Suzzane Estes  Exercises - Seated Transversus Abdominis Bracing  - 3 x daily - 7 x weekly - 3 sets - 10 reps - 5 seconds hold - Seated Gluteal Sets  - 3 x daily - 7 x weekly - 3 sets - 10 reps - 5 seconds hold   Reclined, hooklying hip extension isometrics with leg straight  R 10x5 seconds for 3 sets daily.  Drawing provided.       ASSESSMENT:  CLINICAL IMPRESSION: Low trunk muscle endurance and strength observed. Worked on strengthening and endurance to help address and decrease compensation with lumbar extensor muscles. Decreased low back pain with manual techniques to decrease R lumbar paraspinal muscle tension.  Pt will benefit from continued skilled physical therapy services to decrease pain, improve strength and function.       OBJECTIVE IMPAIRMENTS: Abnormal gait, decreased balance, difficulty walking, decreased strength, improper body mechanics, postural dysfunction, and pain.   ACTIVITY LIMITATIONS: carrying, lifting, bending, sitting, standing, squatting, sleeping, stairs, transfers, bed mobility, dressing, hygiene/grooming, and locomotion level  PARTICIPATION LIMITATIONS:   PERSONAL FACTORS: Age, Fitness, Time since onset of injury/illness/exacerbation, and 3+ comorbidities: anxiety, arthritis, depression, DM, HTN, sleep apnea are also affecting patient's functional outcome.   REHAB POTENTIAL: Fair    CLINICAL DECISION MAKING: Evolving/moderate complexity Pain has worsened since onset per pt.   EVALUATION COMPLEXITY: Moderate   GOALS: Goals reviewed with patient? Yes  SHORT TERM GOALS: Target date: 05/07/2024  Pt will be independent with her initial HEP to decrease pain,  improve strength, function, and ability to perform standing tasks more comfortably.  Baseline: Pt has started her initial HEP (04/22/2024) Goal status: INITIAL     LONG TERM GOALS: Target date: 06/18/2024  Pt will have a decrease in low back pain to 4/10 or less at worst to promote ability to perform standing tasks more comfortably.  Baseline:8/10 low back pain at worst for the past 3 months (04/22/2024)  Goal status: INITIAL  2.  Pt will have a decrease in R LE pain to 4/10 or less at worst to promote ability to ambulate and perform standing tasks more comfortably.  Baseline: 9/10 R LE pain at worst for the past 3 months. (04/22/2024) Goal status: INITIAL  3.  Pt will improve her Modified Oswestry Low Back Pain Disability Questionnaire by at least 20% as a demonstration of improved function.  Baseline: Modified Oswestry Low Back Pain Disability Questionnaire 62% (04/22/2024) Goal status: INITIAL  4.  Pt will improve her B hip extension  and abduction strength by at least 1/2 MMT grade to promote ability to ambulate and perform standing tasks more comfortably.  Baseline:  MMT Right eval Left eval  Hip extension (seated manually resisted) 3+ 3+  Hip abduction (seated manually resisted) 4- 4-   (04/22/2024)  Goal status: INITIAL   PLAN:  PT FREQUENCY: 1-2x/week  PT DURATION: 8 weeks  PLANNED INTERVENTIONS: 97110-Therapeutic exercises, 97530- Therapeutic activity, 97112- Neuromuscular re-education, 97535- Self Care, [id number]- Manual therapy, [phone]- Gait training, [phone]- Aquatic Therapy, [phone]- Electrical stimulation (unattended), [phone]- Traction (mechanical), [id number]- Ionotophoresis 4mg /ml Dexamethasone , Patient/Family education, Balance training, Stair training, Dry Needling, Joint mobilization, and Spinal mobilization.  PLAN FOR NEXT SESSION: Posture, trunk and glute strengthening, manual techniques, modalities PRN   Sebron Mcmahill, PT, DPT 05/19/2024, 10:49 AM

## 2024-05-21 ENCOUNTER — Ambulatory Visit

## 2024-05-21 DIAGNOSIS — M5416 Radiculopathy, lumbar region: Secondary | ICD-10-CM

## 2024-05-21 DIAGNOSIS — M5459 Other low back pain: Secondary | ICD-10-CM

## 2024-05-21 DIAGNOSIS — R262 Difficulty in walking, not elsewhere classified: Secondary | ICD-10-CM

## 2024-05-21 NOTE — Therapy (Signed)
 OUTPATIENT PHYSICAL THERAPY TREATMENT   Patient Name: Emma White MRN: [id number] DOB:[birth year], 68 y.o., female Today's Date: 05/21/2024  END OF SESSION:  PT End of Session - 05/21/24 0803     Visit Number 5    Number of Visits 17    Date for PT Re-Evaluation 06/18/24    PT Start Time 0804    PT Stop Time 0845    PT Time Calculation (min) 41 min    Activity Tolerance Patient tolerated treatment well    Behavior During Therapy [facility] for tasks assessed/performed                 Past Medical History:  Diagnosis Date   Anxiety    Arthritis    hand   Complication of anesthesia 03/18/2022   Hypoxia in pacu. See pacu note 03/18/2022. The hospitalist was consulted for further work-up.   Depression    Diabetes mellitus without complication (HCC)    Dyspnea    DOE   Hyperlipemia    Hypertension    Neuropathy    Sleep apnea    Past Surgical History:  Procedure Laterality Date   ABDOMINAL HYSTERECTOMY  1995   BIOPSY  07/10/2023   Procedure: BIOPSY;  Surgeon: Quintin Buckle, DO;  Location: [facility] ENDOSCOPY;  Service: Gastroenterology;;   CATARACT EXTRACTION W/PHACO Left 09/25/2017   Procedure: CATARACT EXTRACTION PHACO AND INTRAOCULAR LENS PLACEMENT (IOC);  Surgeon: Rosa College, MD;  Location: [facility] ORS;  Service: Ophthalmology;  Laterality: Left;   flui pack lot # [id number] H  US  00:32.7 AP%   12.17 CDE   3.95   CATARACT EXTRACTION W/PHACO Right 10/16/2017   Procedure: CATARACT EXTRACTION PHACO AND INTRAOCULAR LENS PLACEMENT (IOC);  Surgeon: Rosa College, MD;  Location: [facility] ORS;  Service: Ophthalmology;  Laterality: Right;  US  00:32.8 AP% 9.0 CDE 2.96 FLUID PACK LOT # [id number]   COLONOSCOPY WITH PROPOFOL  N/A 05/27/2016   Procedure: COLONOSCOPY WITH PROPOFOL ;  Surgeon: Cassie Click, MD;  Location: [facility] ENDOSCOPY;  Service: Endoscopy;  Laterality: N/A;   COLONOSCOPY WITH PROPOFOL  N/A 07/10/2021   Procedure: COLONOSCOPY WITH PROPOFOL ;  Surgeon:  Shane Darling, MD;  Location: [facility] ENDOSCOPY;  Service: Endoscopy;  Laterality: N/A;   COLONOSCOPY WITH PROPOFOL  N/A 10/30/2021   Procedure: COLONOSCOPY WITH PROPOFOL ;  Surgeon: Shane Darling, MD;  Location: [facility] ENDOSCOPY;  Service: Endoscopy;  Laterality: N/A;   COLONOSCOPY WITH PROPOFOL  N/A 02/05/2022   Procedure: COLONOSCOPY WITH PROPOFOL ;  Surgeon: Shane Darling, MD;  Location: [facility] ENDOSCOPY;  Service: Endoscopy;  Laterality: N/A;   COLONOSCOPY WITH PROPOFOL  N/A 07/10/2023   Procedure: COLONOSCOPY WITH PROPOFOL ;  Surgeon: Quintin Buckle, DO;  Location: [facility] ENDOSCOPY;  Service: Gastroenterology;  Laterality: N/A;   ESOPHAGOGASTRODUODENOSCOPY (EGD) WITH PROPOFOL  N/A 07/10/2021   Procedure: ESOPHAGOGASTRODUODENOSCOPY (EGD) WITH PROPOFOL ;  Surgeon: Shane Darling, MD;  Location: [facility] ENDOSCOPY;  Service: Endoscopy;  Laterality: N/A;  IDDM   ESOPHAGOGASTRODUODENOSCOPY (EGD) WITH PROPOFOL  N/A 10/30/2021   Procedure: ESOPHAGOGASTRODUODENOSCOPY (EGD) WITH PROPOFOL ;  Surgeon: Shane Darling, MD;  Location: [facility] ENDOSCOPY;  Service: Endoscopy;  Laterality: N/A;  IDDM   EYE SURGERY Left 09/25/2017   cataract extraction   HYSTERECTOMY SUPRACERVICAL ABDOMINAL W/REMOVAL TUBES &/OR OVARIES  1995   KENALOG  INJECTION Right 10/16/2017   Procedure: KENALOG  INJECTION;  Surgeon: Rosa College, MD;  Location: [facility] ORS;  Service: Ophthalmology;  Laterality: Right;   KNEE ARTHROCENTESIS Left 1981   KNEE ARTHROCENTESIS Bilateral 1992   right one in 1992  KNEE ARTHROCENTESIS Bilateral    POLYPECTOMY  07/10/2023   Procedure: POLYPECTOMY;  Surgeon: Quintin Buckle, DO;  Location: [facility] ENDOSCOPY;  Service: Gastroenterology;;   XI ROBOTIC LAPAROSCOPIC ASSISTED APPENDECTOMY N/A 03/18/2022   Procedure: XI ROBOTIC LAPAROSCOPIC ASSISTED APPENDECTOMY;  Surgeon: Eldred Grego, MD;  Location: [facility] ORS;  Service: General;  Laterality: N/A;   Patient Active Problem List    Diagnosis Date Noted   Hypoxia 03/18/2022   Hypertension    Diabetes mellitus without complication (HCC)    Depression    Anemia    History of laparoscopic appendectomy     PCP: Jefferey Minerva, PA   REFERRING PROVIDER: Tommas Fragmin Dipakkumar, MD  REFERRING DIAG: [phone] (ICD-10-CM) - Other intervertebral disc degeneration, lumbar region without mention of lumbar back pain or lower extremity pain  Rationale for Evaluation and Treatment: Rehabilitation  THERAPY DIAG:  Other low back pain  Radiculopathy, lumbar region  Difficulty in walking, not elsewhere classified  ONSET DATE: December 2024  SUBJECTIVE:                                                                                                                                                                                           SUBJECTIVE STATEMENT: Could not sleep. Got up yesterday morning and back hurt so bad. 8/10 R low back and 8/10 R L4 dermatome pain currently to the top of her foot.      PERTINENT HISTORY:  Low back pain. Currently wearing a back brace which is not helping. Pt states having a bulging disc. Pain started December 2024, sudden onset, unknown mechanism of injury. Pt states having R Sciatic nerve in R posterior hip pain initially. Had x-ryas done, then and MRI which revealed bulging disc. Pain radiates along the R L5 dermatome as well as sciatic nerve (to superficial peroneal nerve distribution) to dorsal foot. R anterior ankle hurts. Pain has worsened since January 2025. Surgeon wants pt to do PT.      No latex allergies   Had L eye surgery 03/17/2024, pt states she is clear to participate in PT. 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  Blood pressure is controlled per pt.  DM is controlled per pt.  Has a CPAP machine.     PAIN:  Are you having pain? Yes: NPRS scale: 8/10 Pain location: Low back and R L5 dermatome and R sciatic nerve distribution to R dorsal foot.  Pain description: Low  back: pressure; R LE: ache, tight Aggravating factors: Sleeping on her R side, standing and walking Relieving factors: leaning to the L, sleeping on her L side, leaning forward, wearing her back  brace  PRECAUTIONS: Cannot see with L eye.     RED FLAGS: Bowel or bladder incontinence: No and Cauda equina syndrome: No   WEIGHT BEARING RESTRICTIONS: No  FALLS:  Has patient fallen in last 6 months? No  LIVING ENVIRONMENT: Lives with: lives with their spouse Lives in: House/apartment Stairs: Yes: Internal: 2 steps; none and External: 0 steps; none Has following equipment at home: Single point cane  OCCUPATION: Housewife  PLOF: Independent  PATIENT GOALS: decrease pain  NEXT MD VISIT: none at the moment  OBJECTIVE:  Note: Objective measures were completed at Evaluation unless otherwise noted.  DIAGNOSTIC FINDINGS:    PATIENT SURVEYS:  Modified Oswestry Low Back Pain Disability Questionnaire 62% (04/22/2024)  COGNITION: Overall cognitive status: Within functional limits for tasks assessed     SENSATION:   MUSCLE LENGTH:   POSTURE: forward neck, B protracted shoulders, L trunk side bend, movement preference around L4/L5 area, L weight shifting.   PALPATION: TTP R low back, increased B lumbar paraspinal muscle tension R > L  TTP R L5 > L4 > L3 TTP R low back > L side     LUMBAR ROM:   AROM eval  Flexion WFL, R posterior hip pain when returning to the upright position  Extension Very limited with R posterior hip pain  Right lateral flexion Limited with R lateral hip joint pain (worse than L lateral flexion)  Left lateral flexion Limited with R lateral hip pain  Right rotation [facility] with R posterior lateral hip pain worse than L rotation  Left rotation WFL with R posterior lateral hip pain.    (Blank rows = not tested)  LOWER EXTREMITY ROM:     Passive  Right eval Left eval  Hip flexion    Hip extension    Hip abduction    Hip adduction    Hip internal  rotation    Hip external rotation    Knee flexion    Knee extension    Ankle dorsiflexion    Ankle plantarflexion    Ankle inversion    Ankle eversion     (Blank rows = not tested)  LOWER EXTREMITY MMT:    MMT Right eval Left eval  Hip flexion    Hip extension (seated manually resisted) 3+ 3+  Hip abduction (seated manually resisted) 4- 4-  Hip adduction    Hip internal rotation    Hip external rotation    Knee flexion 4 4  Knee extension 4+ 4+  Ankle dorsiflexion    Ankle plantarflexion    Ankle inversion    Ankle eversion     (Blank rows = not tested)  LUMBAR SPECIAL TESTS:    FUNCTIONAL TESTS:    GAIT: Distance walked: 30 ft Assistive device utilized: None Level of assistance: SBA Comments: Decreased stance R LE, antalgic with R trunk side bend during R LE stance phase, decreased B knee extension during stance phase, decreased foot clearance and gait velocity.   TREATMENT DATE: 05/21/2024  Neuromuscular Re-education   Hooklying   Transversus abdominis contraction 10x3 with 5 second holds   Decreased low back pain reported.    Posterior pelvic tilt 10x3 with 5 second holds   SKTC    R 10x10 second holds      L 10x10 second holds    Decreased R LE symptoms reported '   Bent knee fallout 5x each LE   Increased time secondary to emphasis on proper technique.   Log roll for supine to sit on R side. 1x with PT min A.    Improved exercise technique, movement at target joints, use of target muscles after mod verbal, visual, tactile cues.     PATIENT EDUCATION:  Education details: there-ex, HEP, POC Person educated: Patient Education method: Explanation, Demonstration, Tactile cues, Verbal cues, and Handouts Education comprehension: verbalized understanding and returned demonstration  HOME EXERCISE PROGRAM: Access Code:  [email or web address] URL: [email or web address] Date: 04/22/2024 Prepared by: Suzzane Estes  Exercises - Seated Transversus Abdominis Bracing  - 3 x daily - 7 x weekly - 3 sets - 10 reps - 5 seconds hold - Seated Gluteal Sets  - 3 x daily - 7 x weekly - 3 sets - 10 reps - 5 seconds hold   Reclined, hooklying hip extension isometrics with leg straight  R 10x5 seconds for 3 sets daily.  Drawing provided.      - Supine Transversus Abdominis Bracing - Hands on Stomach  - 5 x daily - 7 x weekly - 3 sets - 10 reps - 5 seconds hold   ASSESSMENT:  CLINICAL IMPRESSION:  Worked on core strengthening to decrease extension stress to low back. Decreased low back and no R LE pain in sitting afterwards. Symptoms returned in standing and walking. Pt will benefit from continued skilled physical therapy services to decrease pain, improve strength and function.       OBJECTIVE IMPAIRMENTS: Abnormal gait, decreased balance, difficulty walking, decreased strength, improper body mechanics, postural dysfunction, and pain.   ACTIVITY LIMITATIONS: carrying, lifting, bending, sitting, standing, squatting, sleeping, stairs, transfers, bed mobility, dressing, hygiene/grooming, and locomotion level  PARTICIPATION LIMITATIONS:   PERSONAL FACTORS: Age, Fitness, Time since onset of injury/illness/exacerbation, and 3+ comorbidities: anxiety, arthritis, depression, DM, HTN, sleep apnea are also affecting patient's functional outcome.   REHAB POTENTIAL: Fair    CLINICAL DECISION MAKING: Evolving/moderate complexity Pain has worsened since onset per pt.   EVALUATION COMPLEXITY: Moderate   GOALS: Goals reviewed with patient? Yes  SHORT TERM GOALS: Target date: 05/07/2024  Pt will be independent with her initial HEP to decrease pain, improve strength, function, and ability to perform standing tasks more comfortably.  Baseline: Pt has started her initial HEP (04/22/2024) Goal status: INITIAL     LONG  TERM GOALS: Target date: 06/18/2024  Pt will have a decrease in low back pain to 4/10 or less at worst to promote ability to perform standing tasks more comfortably.  Baseline:8/10 low back pain at worst for the past 3 months (04/22/2024)  Goal status: INITIAL  2.  Pt will have a decrease in R LE pain to 4/10 or less at worst to promote ability to ambulate and perform standing tasks more comfortably.  Baseline: 9/10 R LE pain at worst for the past 3 months. (04/22/2024) Goal status: INITIAL  3.  Pt will improve her Modified Oswestry Low Back Pain Disability Questionnaire by at least 20% as a demonstration of improved function.  Baseline: Modified Oswestry Low Back Pain Disability Questionnaire 62% (04/22/2024) Goal  status: INITIAL  4.  Pt will improve her B hip extension and abduction strength by at least 1/2 MMT grade to promote ability to ambulate and perform standing tasks more comfortably.  Baseline:  MMT Right eval Left eval  Hip extension (seated manually resisted) 3+ 3+  Hip abduction (seated manually resisted) 4- 4-   (04/22/2024)  Goal status: INITIAL   PLAN:  PT FREQUENCY: 1-2x/week  PT DURATION: 8 weeks  PLANNED INTERVENTIONS: 97110-Therapeutic exercises, 97530- Therapeutic activity, 97112- Neuromuscular re-education, 97535- Self Care, 16109- Manual therapy, [phone]- Gait training, [phone]- Aquatic Therapy, [phone]- Electrical stimulation (unattended), [phone]- Traction (mechanical), [id number]- Ionotophoresis 4mg /ml Dexamethasone , Patient/Family education, Balance training, Stair training, Dry Needling, Joint mobilization, and Spinal mobilization.  PLAN FOR NEXT SESSION: Posture, trunk and glute strengthening, manual techniques, modalities PRN   Strummer Canipe, PT, DPT 05/21/2024, 8:51 AM

## 2024-05-24 DIAGNOSIS — R010 Benign and innocent cardiac murmurs: Secondary | ICD-10-CM | POA: Insufficient documentation

## 2024-05-25 ENCOUNTER — Ambulatory Visit

## 2024-05-25 DIAGNOSIS — M5459 Other low back pain: Secondary | ICD-10-CM

## 2024-05-25 DIAGNOSIS — R262 Difficulty in walking, not elsewhere classified: Secondary | ICD-10-CM

## 2024-05-25 DIAGNOSIS — M5416 Radiculopathy, lumbar region: Secondary | ICD-10-CM

## 2024-05-25 NOTE — Therapy (Signed)
 OUTPATIENT PHYSICAL THERAPY TREATMENT   Patient Name: Emma White MRN: [id number] DOB:15-Jun-1956, 68 y.o., female Today's Date: 05/25/2024  END OF SESSION:  PT End of Session - 05/25/24 1346     Visit Number 6    Number of Visits 17    Date for PT Re-Evaluation 06/18/24    PT Start Time 1347    PT Stop Time 1426    PT Time Calculation (min) 39 min    Activity Tolerance Patient tolerated treatment well    Behavior During Therapy [facility] for tasks assessed/performed                  Past Medical History:  Diagnosis Date   Anxiety    Arthritis    hand   Complication of anesthesia 03/18/2022   Hypoxia in pacu. See pacu note 03/18/2022. The hospitalist was consulted for further work-up.   Depression    Diabetes mellitus without complication (HCC)    Dyspnea    DOE   Hyperlipemia    Hypertension    Neuropathy    Sleep apnea    Past Surgical History:  Procedure Laterality Date   ABDOMINAL HYSTERECTOMY  1995   BIOPSY  07/10/2023   Procedure: BIOPSY;  Surgeon: Quintin Buckle, DO;  Location: [facility] ENDOSCOPY;  Service: Gastroenterology;;   CATARACT EXTRACTION W/PHACO Left 09/25/2017   Procedure: CATARACT EXTRACTION PHACO AND INTRAOCULAR LENS PLACEMENT (IOC);  Surgeon: Rosa College, MD;  Location: [facility] ORS;  Service: Ophthalmology;  Laterality: Left;   flui pack lot # [id number]  US  00:32.7 AP%   12.17 CDE   3.95   CATARACT EXTRACTION W/PHACO Right 10/16/2017   Procedure: CATARACT EXTRACTION PHACO AND INTRAOCULAR LENS PLACEMENT (IOC);  Surgeon: Rosa College, MD;  Location: [facility] ORS;  Service: Ophthalmology;  Laterality: Right;  US  00:32.8 AP% 9.0 CDE 2.96 FLUID PACK LOT # [id number]   COLONOSCOPY WITH PROPOFOL  N/A 05/27/2016   Procedure: COLONOSCOPY WITH PROPOFOL ;  Surgeon: Cassie Click, MD;  Location: [facility] ENDOSCOPY;  Service: Endoscopy;  Laterality: N/A;   COLONOSCOPY WITH PROPOFOL  N/A 07/10/2021   Procedure: COLONOSCOPY WITH PROPOFOL ;  Surgeon:  Shane Darling, MD;  Location: [facility] ENDOSCOPY;  Service: Endoscopy;  Laterality: N/A;   COLONOSCOPY WITH PROPOFOL  N/A 10/30/2021   Procedure: COLONOSCOPY WITH PROPOFOL ;  Surgeon: Shane Darling, MD;  Location: [facility] ENDOSCOPY;  Service: Endoscopy;  Laterality: N/A;   COLONOSCOPY WITH PROPOFOL  N/A 02/05/2022   Procedure: COLONOSCOPY WITH PROPOFOL ;  Surgeon: Shane Darling, MD;  Location: [facility] ENDOSCOPY;  Service: Endoscopy;  Laterality: N/A;   COLONOSCOPY WITH PROPOFOL  N/A 07/10/2023   Procedure: COLONOSCOPY WITH PROPOFOL ;  Surgeon: Quintin Buckle, DO;  Location: [facility] ENDOSCOPY;  Service: Gastroenterology;  Laterality: N/A;   ESOPHAGOGASTRODUODENOSCOPY (EGD) WITH PROPOFOL  N/A 07/10/2021   Procedure: ESOPHAGOGASTRODUODENOSCOPY (EGD) WITH PROPOFOL ;  Surgeon: Shane Darling, MD;  Location: [facility] ENDOSCOPY;  Service: Endoscopy;  Laterality: N/A;  IDDM   ESOPHAGOGASTRODUODENOSCOPY (EGD) WITH PROPOFOL  N/A 10/30/2021   Procedure: ESOPHAGOGASTRODUODENOSCOPY (EGD) WITH PROPOFOL ;  Surgeon: Shane Darling, MD;  Location: [facility] ENDOSCOPY;  Service: Endoscopy;  Laterality: N/A;  IDDM   EYE SURGERY Left 09/25/2017   cataract extraction   HYSTERECTOMY SUPRACERVICAL ABDOMINAL W/REMOVAL TUBES &/OR OVARIES  1995   KENALOG  INJECTION Right 10/16/2017   Procedure: KENALOG  INJECTION;  Surgeon: Rosa College, MD;  Location: [facility] ORS;  Service: Ophthalmology;  Laterality: Right;   KNEE ARTHROCENTESIS Left 1981   KNEE ARTHROCENTESIS Bilateral 1992   right one in  1992   KNEE ARTHROCENTESIS Bilateral    POLYPECTOMY  07/10/2023   Procedure: POLYPECTOMY;  Surgeon: Quintin Buckle, DO;  Location: [facility] ENDOSCOPY;  Service: Gastroenterology;;   XI ROBOTIC LAPAROSCOPIC ASSISTED APPENDECTOMY N/A 03/18/2022   Procedure: XI ROBOTIC LAPAROSCOPIC ASSISTED APPENDECTOMY;  Surgeon: Eldred Grego, MD;  Location: [facility] ORS;  Service: General;  Laterality: N/A;   Patient Active Problem List    Diagnosis Date Noted   Hypoxia 03/18/2022   Hypertension    Diabetes mellitus without complication (HCC)    Depression    Anemia    History of laparoscopic appendectomy     PCP: Jefferey Minerva, PA   REFERRING PROVIDER: Tommas Fragmin Dipakkumar, MD  REFERRING DIAG: [phone] (ICD-10-CM) - Other intervertebral disc degeneration, lumbar region without mention of lumbar back pain or lower extremity pain  Rationale for Evaluation and Treatment: Rehabilitation  THERAPY DIAG:  Other low back pain  Radiculopathy, lumbar region  Difficulty in walking, not elsewhere classified  ONSET DATE: December 2024  SUBJECTIVE:                                                                                                                                                                                           SUBJECTIVE STATEMENT: Back is doing a little bit better. R butt still hurts, 6/10 currently. Was prescribed percocet last Friday.         PERTINENT HISTORY:  Low back pain. Currently wearing a back brace which is not helping. Pt states having a bulging disc. Pain started December 2024, sudden onset, unknown mechanism of injury. Pt states having R Sciatic nerve in R posterior hip pain initially. Had x-ryas done, then and MRI which revealed bulging disc. Pain radiates along the R L5 dermatome as well as sciatic nerve (to superficial peroneal nerve distribution) to dorsal foot. R anterior ankle hurts. Pain has worsened since January 2025. Surgeon wants pt to do PT.      No latex allergies   Had L eye surgery 03/17/2024, pt states she is clear to participate in PT. 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  Blood pressure is controlled per pt.  DM is controlled per pt.  Has a CPAP machine.     PAIN:  Are you having pain? Yes: NPRS scale: 8/10 Pain location: Low back and R L5 dermatome and R sciatic nerve distribution to R dorsal foot.  Pain description: Low back: pressure; R LE: ache,  tight Aggravating factors: Sleeping on her R side, standing and walking Relieving factors: leaning to the L, sleeping on her L side, leaning forward, wearing her back brace  PRECAUTIONS: Cannot see  with L eye.     RED FLAGS: Bowel or bladder incontinence: No and Cauda equina syndrome: No   WEIGHT BEARING RESTRICTIONS: No  FALLS:  Has patient fallen in last 6 months? No  LIVING ENVIRONMENT: Lives with: lives with their spouse Lives in: House/apartment Stairs: Yes: Internal: 2 steps; none and External: 0 steps; none Has following equipment at home: Single point cane  OCCUPATION: Housewife  PLOF: Independent  PATIENT GOALS: decrease pain  NEXT MD VISIT: none at the moment  OBJECTIVE:  Note: Objective measures were completed at Evaluation unless otherwise noted.  DIAGNOSTIC FINDINGS:    PATIENT SURVEYS:  Modified Oswestry Low Back Pain Disability Questionnaire 62% (04/22/2024)  COGNITION: Overall cognitive status: Within functional limits for tasks assessed     SENSATION:   MUSCLE LENGTH:   POSTURE: forward neck, B protracted shoulders, L trunk side bend, movement preference around L4/L5 area, L weight shifting.   PALPATION: TTP R low back, increased B lumbar paraspinal muscle tension R > L  TTP R L5 > L4 > L3 TTP R low back > L side     LUMBAR ROM:   AROM eval  Flexion WFL, R posterior hip pain when returning to the upright position  Extension Very limited with R posterior hip pain  Right lateral flexion Limited with R lateral hip joint pain (worse than L lateral flexion)  Left lateral flexion Limited with R lateral hip pain  Right rotation [facility] with R posterior lateral hip pain worse than L rotation  Left rotation WFL with R posterior lateral hip pain.    (Blank rows = not tested)  LOWER EXTREMITY ROM:     Passive  Right eval Left eval  Hip flexion    Hip extension    Hip abduction    Hip adduction    Hip internal rotation    Hip external  rotation    Knee flexion    Knee extension    Ankle dorsiflexion    Ankle plantarflexion    Ankle inversion    Ankle eversion     (Blank rows = not tested)  LOWER EXTREMITY MMT:    MMT Right eval Left eval  Hip flexion    Hip extension (seated manually resisted) 3+ 3+  Hip abduction (seated manually resisted) 4- 4-  Hip adduction    Hip internal rotation    Hip external rotation    Knee flexion 4 4  Knee extension 4+ 4+  Ankle dorsiflexion    Ankle plantarflexion    Ankle inversion    Ankle eversion     (Blank rows = not tested)  LUMBAR SPECIAL TESTS:    FUNCTIONAL TESTS:    GAIT: Distance walked: 30 ft Assistive device utilized: None Level of assistance: SBA Comments: Decreased stance R LE, antalgic with R trunk side bend during R LE stance phase, decreased B knee extension during stance phase, decreased foot clearance and gait velocity.   TREATMENT DATE: 05/25/2024  Neuromuscular Re-education   Hooklying   Posterior pelvic tilt 10x3 with 5 second holds    Crunch 5x4   SKTC    L 10x10 second holds for 2 sets       Bent knee fallout 5x each LE   Increased time secondary to emphasis on proper technique.   Mini bridge 1x   Low back pain, eases with rest.   Hooklying  Bent knee fallout 5x each LE   Increased time secondary to emphasis on proper technique.    L pelvic rotation with L hip fallout observed.   Standing bend over onto elevated low mat table   Hip extension    R 10x2   L 10x    Low back discomfort when performing for L side.        Improved exercise technique, movement at target joints, use of target muscles after mod verbal, visual, tactile cues.     PATIENT EDUCATION:  Education details: there-ex, HEP, POC Person educated: Patient Education method: Explanation, Demonstration, Tactile cues, Verbal  cues, and Handouts Education comprehension: verbalized understanding and returned demonstration  HOME EXERCISE PROGRAM: Access Code: [email or web address] URL: [email or web address] Date: 04/22/2024 Prepared by: Suzzane Estes  Exercises - Seated Transversus Abdominis Bracing  - 3 x daily - 7 x weekly - 3 sets - 10 reps - 5 seconds hold - Seated Gluteal Sets  - 3 x daily - 7 x weekly - 3 sets - 10 reps - 5 seconds hold   Reclined, hooklying hip extension isometrics with leg straight  R 10x5 seconds for 3 sets daily.  Drawing provided.      - Supine Transversus Abdominis Bracing - Hands on Stomach  - 5 x daily - 7 x weekly - 3 sets - 10 reps - 5 seconds hold   ASSESSMENT:  CLINICAL IMPRESSION:  Continued working on core and glute strengthening to decrease extension stress to low back. Decreased low back and R posterior hip pain reported after session.  Pt will benefit from continued skilled physical therapy services to decrease pain, improve strength and function.       OBJECTIVE IMPAIRMENTS: Abnormal gait, decreased balance, difficulty walking, decreased strength, improper body mechanics, postural dysfunction, and pain.   ACTIVITY LIMITATIONS: carrying, lifting, bending, sitting, standing, squatting, sleeping, stairs, transfers, bed mobility, dressing, hygiene/grooming, and locomotion level  PARTICIPATION LIMITATIONS:   PERSONAL FACTORS: Age, Fitness, Time since onset of injury/illness/exacerbation, and 3+ comorbidities: anxiety, arthritis, depression, DM, HTN, sleep apnea are also affecting patient's functional outcome.   REHAB POTENTIAL: Fair    CLINICAL DECISION MAKING: Evolving/moderate complexity Pain has worsened since onset per pt.   EVALUATION COMPLEXITY: Moderate   GOALS: Goals reviewed with patient? Yes  SHORT TERM GOALS: Target date: 05/07/2024  Pt will be independent with her initial HEP to decrease pain, improve strength, function, and ability to perform  standing tasks more comfortably.  Baseline: Pt has started her initial HEP (04/22/2024) Goal status: INITIAL     LONG TERM GOALS: Target date: 06/18/2024  Pt will have a decrease in low back pain to 4/10 or less at worst to promote ability to perform standing tasks more comfortably.  Baseline:8/10 low back pain at worst for the past 3 months (04/22/2024)  Goal status: INITIAL  2.  Pt will have a decrease in R LE pain to 4/10 or less at worst to promote ability to ambulate and perform standing tasks more comfortably.  Baseline: 9/10 R LE pain at worst for the past 3 months. (  04/22/2024) Goal status: INITIAL  3.  Pt will improve her Modified Oswestry Low Back Pain Disability Questionnaire by at least 20% as a demonstration of improved function.  Baseline: Modified Oswestry Low Back Pain Disability Questionnaire 62% (04/22/2024) Goal status: INITIAL  4.  Pt will improve her B hip extension and abduction strength by at least 1/2 MMT grade to promote ability to ambulate and perform standing tasks more comfortably.  Baseline:  MMT Right eval Left eval  Hip extension (seated manually resisted) 3+ 3+  Hip abduction (seated manually resisted) 4- 4-   (04/22/2024)  Goal status: INITIAL   PLAN:  PT FREQUENCY: 1-2x/week  PT DURATION: 8 weeks  PLANNED INTERVENTIONS: 97110-Therapeutic exercises, 97530- Therapeutic activity, 97112- Neuromuscular re-education, 97535- Self Care, 16109- Manual therapy, [phone]- Gait training, [phone]- Aquatic Therapy, [phone]- Electrical stimulation (unattended), [phone]- Traction (mechanical), [id number]- Ionotophoresis 4mg /ml Dexamethasone , Patient/Family education, Balance training, Stair training, Dry Needling, Joint mobilization, and Spinal mobilization.  PLAN FOR NEXT SESSION: Posture, trunk and glute strengthening, manual techniques, modalities PRN   Dearion Huot, PT, DPT 05/25/2024, 2:27 PM

## 2024-05-26 ENCOUNTER — Other Ambulatory Visit: Payer: Self-pay | Admitting: Physician Assistant

## 2024-05-26 DIAGNOSIS — Z780 Asymptomatic menopausal state: Secondary | ICD-10-CM

## 2024-05-27 ENCOUNTER — Ambulatory Visit

## 2024-05-27 DIAGNOSIS — M5459 Other low back pain: Secondary | ICD-10-CM | POA: Diagnosis not present

## 2024-05-27 DIAGNOSIS — R262 Difficulty in walking, not elsewhere classified: Secondary | ICD-10-CM

## 2024-05-27 DIAGNOSIS — M5416 Radiculopathy, lumbar region: Secondary | ICD-10-CM

## 2024-05-27 NOTE — Therapy (Signed)
 OUTPATIENT PHYSICAL THERAPY TREATMENT   Patient Name: Emma White MRN: [id number] DOB:1956-02-25, 68 y.o., female Today's Date: 05/27/2024  END OF SESSION:  PT End of Session - 05/27/24 1516     Visit Number 7    Number of Visits 17    Date for PT Re-Evaluation 06/18/24    PT Start Time 1516    PT Stop Time 1555    PT Time Calculation (min) 39 min    Activity Tolerance Patient tolerated treatment well    Behavior During Therapy [facility] for tasks assessed/performed                Past Medical History:  Diagnosis Date   Anxiety    Arthritis    hand   Complication of anesthesia 03/18/2022   Hypoxia in pacu. See pacu note 03/18/2022. The hospitalist was consulted for further work-up.   Depression    Diabetes mellitus without complication (HCC)    Dyspnea    DOE   Hyperlipemia    Hypertension    Neuropathy    Sleep apnea    Past Surgical History:  Procedure Laterality Date   ABDOMINAL HYSTERECTOMY  1995   BIOPSY  07/10/2023   Procedure: BIOPSY;  Surgeon: Quintin Buckle, DO;  Location: [facility] ENDOSCOPY;  Service: Gastroenterology;;   CATARACT EXTRACTION W/PHACO Left 09/25/2017   Procedure: CATARACT EXTRACTION PHACO AND INTRAOCULAR LENS PLACEMENT (IOC);  Surgeon: Rosa College, MD;  Location: [facility] ORS;  Service: Ophthalmology;  Laterality: Left;   flui pack lot # [id number]  US  00:32.7 AP%   12.17 CDE   3.95   CATARACT EXTRACTION W/PHACO Right 10/16/2017   Procedure: CATARACT EXTRACTION PHACO AND INTRAOCULAR LENS PLACEMENT (IOC);  Surgeon: Rosa College, MD;  Location: [facility] ORS;  Service: Ophthalmology;  Laterality: Right;  US  00:32.8 AP% 9.0 CDE 2.96 FLUID PACK LOT # [id number]   COLONOSCOPY WITH PROPOFOL  N/A 05/27/2016   Procedure: COLONOSCOPY WITH PROPOFOL ;  Surgeon: Cassie Click, MD;  Location: [facility] ENDOSCOPY;  Service: Endoscopy;  Laterality: N/A;   COLONOSCOPY WITH PROPOFOL  N/A 07/10/2021   Procedure: COLONOSCOPY WITH PROPOFOL ;  Surgeon:  Shane Darling, MD;  Location: [facility] ENDOSCOPY;  Service: Endoscopy;  Laterality: N/A;   COLONOSCOPY WITH PROPOFOL  N/A 10/30/2021   Procedure: COLONOSCOPY WITH PROPOFOL ;  Surgeon: Shane Darling, MD;  Location: [facility] ENDOSCOPY;  Service: Endoscopy;  Laterality: N/A;   COLONOSCOPY WITH PROPOFOL  N/A 02/05/2022   Procedure: COLONOSCOPY WITH PROPOFOL ;  Surgeon: Shane Darling, MD;  Location: [facility] ENDOSCOPY;  Service: Endoscopy;  Laterality: N/A;   COLONOSCOPY WITH PROPOFOL  N/A 07/10/2023   Procedure: COLONOSCOPY WITH PROPOFOL ;  Surgeon: Quintin Buckle, DO;  Location: [facility] ENDOSCOPY;  Service: Gastroenterology;  Laterality: N/A;   ESOPHAGOGASTRODUODENOSCOPY (EGD) WITH PROPOFOL  N/A 07/10/2021   Procedure: ESOPHAGOGASTRODUODENOSCOPY (EGD) WITH PROPOFOL ;  Surgeon: Shane Darling, MD;  Location: [facility] ENDOSCOPY;  Service: Endoscopy;  Laterality: N/A;  IDDM   ESOPHAGOGASTRODUODENOSCOPY (EGD) WITH PROPOFOL  N/A 10/30/2021   Procedure: ESOPHAGOGASTRODUODENOSCOPY (EGD) WITH PROPOFOL ;  Surgeon: Shane Darling, MD;  Location: [facility] ENDOSCOPY;  Service: Endoscopy;  Laterality: N/A;  IDDM   EYE SURGERY Left 09/25/2017   cataract extraction   HYSTERECTOMY SUPRACERVICAL ABDOMINAL W/REMOVAL TUBES &/OR OVARIES  1995   KENALOG  INJECTION Right 10/16/2017   Procedure: KENALOG  INJECTION;  Surgeon: Rosa College, MD;  Location: [facility] ORS;  Service: Ophthalmology;  Laterality: Right;   KNEE ARTHROCENTESIS Left 1981   KNEE ARTHROCENTESIS Bilateral 1992   right one in 1992  KNEE ARTHROCENTESIS Bilateral    POLYPECTOMY  07/10/2023   Procedure: POLYPECTOMY;  Surgeon: Quintin Buckle, DO;  Location: [facility] ENDOSCOPY;  Service: Gastroenterology;;   XI ROBOTIC LAPAROSCOPIC ASSISTED APPENDECTOMY N/A 03/18/2022   Procedure: XI ROBOTIC LAPAROSCOPIC ASSISTED APPENDECTOMY;  Surgeon: Eldred Grego, MD;  Location: [facility] ORS;  Service: General;  Laterality: N/A;   Patient Active Problem List    Diagnosis Date Noted   Hypoxia 03/18/2022   Hypertension    Diabetes mellitus without complication (HCC)    Depression    Anemia    History of laparoscopic appendectomy     PCP: Jefferey Minerva, PA   REFERRING PROVIDER: Tommas Fragmin Dipakkumar, MD  REFERRING DIAG: [phone] (ICD-10-CM) - Other intervertebral disc degeneration, lumbar region without mention of lumbar back pain or lower extremity pain  Rationale for Evaluation and Treatment: Rehabilitation  THERAPY DIAG:  Other low back pain  Radiculopathy, lumbar region  Difficulty in walking, not elsewhere classified  ONSET DATE: December 2024  SUBJECTIVE:                                                                                                                                                                                           SUBJECTIVE STATEMENT: Did not wear her back brace all day today. Back is better. Not hurting as bad if she does not walk as long or bend over. 5/10 low back and R butt pain currently.           PERTINENT HISTORY:  Low back pain. Currently wearing a back brace which is not helping. Pt states having a bulging disc. Pain started December 2024, sudden onset, unknown mechanism of injury. Pt states having R Sciatic nerve in R posterior hip pain initially. Had x-ryas done, then and MRI which revealed bulging disc. Pain radiates along the R L5 dermatome as well as sciatic nerve (to superficial peroneal nerve distribution) to dorsal foot. R anterior ankle hurts. Pain has worsened since January 2025. Surgeon wants pt to do PT.      No latex allergies   Had L eye surgery 03/17/2024, pt states she is clear to participate in PT. 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  Blood pressure is controlled per pt.  DM is controlled per pt.  Has a CPAP machine.     PAIN:  Are you having pain? Yes: NPRS scale: 8/10 Pain location: Low back and R L5 dermatome and R sciatic nerve distribution to R dorsal  foot.  Pain description: Low back: pressure; R LE: ache, tight Aggravating factors: Sleeping on her R side, standing and walking Relieving factors: leaning to the L,  sleeping on her L side, leaning forward, wearing her back brace  PRECAUTIONS: Cannot see with L eye.     RED FLAGS: Bowel or bladder incontinence: No and Cauda equina syndrome: No   WEIGHT BEARING RESTRICTIONS: No  FALLS:  Has patient fallen in last 6 months? No  LIVING ENVIRONMENT: Lives with: lives with their spouse Lives in: House/apartment Stairs: Yes: Internal: 2 steps; none and External: 0 steps; none Has following equipment at home: Single point cane  OCCUPATION: Housewife  PLOF: Independent  PATIENT GOALS: decrease pain  NEXT MD VISIT: none at the moment  OBJECTIVE:  Note: Objective measures were completed at Evaluation unless otherwise noted.  DIAGNOSTIC FINDINGS:    PATIENT SURVEYS:  Modified Oswestry Low Back Pain Disability Questionnaire 62% (04/22/2024)  COGNITION: Overall cognitive status: Within functional limits for tasks assessed     SENSATION:   MUSCLE LENGTH:   POSTURE: forward neck, B protracted shoulders, L trunk side bend, movement preference around L4/L5 area, L weight shifting.   PALPATION: TTP R low back, increased B lumbar paraspinal muscle tension R > L  TTP R L5 > L4 > L3 TTP R low back > L side     LUMBAR ROM:   AROM eval  Flexion WFL, R posterior hip pain when returning to the upright position  Extension Very limited with R posterior hip pain  Right lateral flexion Limited with R lateral hip joint pain (worse than L lateral flexion)  Left lateral flexion Limited with R lateral hip pain  Right rotation [facility] with R posterior lateral hip pain worse than L rotation  Left rotation WFL with R posterior lateral hip pain.    (Blank rows = not tested)  LOWER EXTREMITY ROM:     Passive  Right eval Left eval  Hip flexion    Hip extension    Hip abduction     Hip adduction    Hip internal rotation    Hip external rotation    Knee flexion    Knee extension    Ankle dorsiflexion    Ankle plantarflexion    Ankle inversion    Ankle eversion     (Blank rows = not tested)  LOWER EXTREMITY MMT:    MMT Right eval Left eval  Hip flexion    Hip extension (seated manually resisted) 3+ 3+  Hip abduction (seated manually resisted) 4- 4-  Hip adduction    Hip internal rotation    Hip external rotation    Knee flexion 4 4  Knee extension 4+ 4+  Ankle dorsiflexion    Ankle plantarflexion    Ankle inversion    Ankle eversion     (Blank rows = not tested)  LUMBAR SPECIAL TESTS:    FUNCTIONAL TESTS:    GAIT: Distance walked: 30 ft Assistive device utilized: None Level of assistance: SBA Comments: Decreased stance R LE, antalgic with R trunk side bend during R LE stance phase, decreased B knee extension during stance phase, decreased foot clearance and gait velocity.   TREATMENT DATE: 05/27/2024  Neuromuscular Re-education   Hooklying   Posterior pelvic tilt 5x3 with 15 second holds    Crunch 5x5   SKTC    L 10x10 second holds for 2 sets       Bent knee fallout 5x2 each LE   Increased time secondary to emphasis on proper technique.  L pelvic rotation with L hip fallout observed.    Posterior pelvic tilt with march 10x3 each LE alternating   Clamshell green band 10x  Pt states feeling the work in her low back    Then performed in the relined position 5x. Feels work more at the hips but still feels it in the the back.     Standing bent over onto elevated low mat table   Hip extension    R 10x, then 6x     Improved exercise technique, movement at target joints, use of target muscles after mod verbal, visual, tactile cues.     PATIENT EDUCATION:  Education details: there-ex, HEP, POC Person  educated: Patient Education method: Explanation, Demonstration, Tactile cues, Verbal cues, and Handouts Education comprehension: verbalized understanding and returned demonstration  HOME EXERCISE PROGRAM: Access Code: [email or web address] URL: [email or web address] Date: 04/22/2024 Prepared by: Suzzane Estes  Exercises - Seated Transversus Abdominis Bracing  - 3 x daily - 7 x weekly - 3 sets - 10 reps - 5 seconds hold - Seated Gluteal Sets  - 3 x daily - 7 x weekly - 3 sets - 10 reps - 5 seconds hold   Reclined, hooklying hip extension isometrics with leg straight  R 10x5 seconds for 3 sets daily.  Drawing provided.      - Supine Transversus Abdominis Bracing - Hands on Stomach  - 5 x daily - 7 x weekly - 3 sets - 10 reps - 5 seconds hold - Supine Posterior Pelvic Tilt  - 2 x daily - 7 x weekly - 3 sets - 5 reps - 15 seconds hold  - Bent Knee Fallouts with Alternating Legs  - 1 x daily - 7 x weekly - 3 sets - 5 reps  ASSESSMENT:  CLINICAL IMPRESSION: Improving low back pain based on subjective reports. Continued working on core and glute strengthening to decrease extension stress to low back. Back feels better after session reported. Pt will benefit from continued skilled physical therapy services to decrease pain, improve strength and function.       OBJECTIVE IMPAIRMENTS: Abnormal gait, decreased balance, difficulty walking, decreased strength, improper body mechanics, postural dysfunction, and pain.   ACTIVITY LIMITATIONS: carrying, lifting, bending, sitting, standing, squatting, sleeping, stairs, transfers, bed mobility, dressing, hygiene/grooming, and locomotion level  PARTICIPATION LIMITATIONS:   PERSONAL FACTORS: Age, Fitness, Time since onset of injury/illness/exacerbation, and 3+ comorbidities: anxiety, arthritis, depression, DM, HTN, sleep apnea are also affecting patient's functional outcome.   REHAB POTENTIAL: Fair    CLINICAL DECISION MAKING: Evolving/moderate  complexity Pain has worsened since onset per pt.   EVALUATION COMPLEXITY: Moderate   GOALS: Goals reviewed with patient? Yes  SHORT TERM GOALS: Target date: 05/07/2024  Pt will be independent with her initial HEP to decrease pain, improve strength, function, and ability to perform standing tasks more comfortably.  Baseline: Pt has started her initial HEP (04/22/2024) Goal status: INITIAL     LONG TERM GOALS: Target date: 06/18/2024  Pt will have a decrease in low back pain to 4/10 or less at worst to promote ability to perform standing tasks more comfortably.  Baseline:8/10 low back pain at worst for the  past 3 months (04/22/2024)  Goal status: INITIAL  2.  Pt will have a decrease in R LE pain to 4/10 or less at worst to promote ability to ambulate and perform standing tasks more comfortably.  Baseline: 9/10 R LE pain at worst for the past 3 months. (04/22/2024) Goal status: INITIAL  3.  Pt will improve her Modified Oswestry Low Back Pain Disability Questionnaire by at least 20% as a demonstration of improved function.  Baseline: Modified Oswestry Low Back Pain Disability Questionnaire 62% (04/22/2024) Goal status: INITIAL  4.  Pt will improve her B hip extension and abduction strength by at least 1/2 MMT grade to promote ability to ambulate and perform standing tasks more comfortably.  Baseline:  MMT Right eval Left eval  Hip extension (seated manually resisted) 3+ 3+  Hip abduction (seated manually resisted) 4- 4-   (04/22/2024)  Goal status: INITIAL   PLAN:  PT FREQUENCY: 1-2x/week  PT DURATION: 8 weeks  PLANNED INTERVENTIONS: 97110-Therapeutic exercises, 97530- Therapeutic activity, 97112- Neuromuscular re-education, 97535- Self Care, 16109- Manual therapy, [phone]- Gait training, [phone]- Aquatic Therapy, [phone]- Electrical stimulation (unattended), [phone]- Traction (mechanical), [id number]- Ionotophoresis 4mg /ml Dexamethasone , Patient/Family education, Balance training, Stair  training, Dry Needling, Joint mobilization, and Spinal mobilization.  PLAN FOR NEXT SESSION: Posture, trunk and glute strengthening, manual techniques, modalities PRN   Ravi Tuccillo, PT, DPT 05/27/2024, 3:57 PM

## 2024-05-31 ENCOUNTER — Ambulatory Visit

## 2024-05-31 DIAGNOSIS — M5416 Radiculopathy, lumbar region: Secondary | ICD-10-CM

## 2024-05-31 DIAGNOSIS — M5459 Other low back pain: Secondary | ICD-10-CM

## 2024-05-31 DIAGNOSIS — R262 Difficulty in walking, not elsewhere classified: Secondary | ICD-10-CM

## 2024-05-31 NOTE — Therapy (Signed)
 OUTPATIENT PHYSICAL THERAPY TREATMENT   Patient Name: Emma White MRN: [id number] DOB:12/06/1956, 68 y.o., female Today's Date: 05/31/2024  END OF SESSION:  PT End of Session - 05/31/24 0904     Visit Number 8    Number of Visits 17    Date for PT Re-Evaluation 06/18/24    PT Start Time 0904    PT Stop Time 0945    PT Time Calculation (min) 41 min    Activity Tolerance Patient tolerated treatment well    Behavior During Therapy [facility] for tasks assessed/performed                 Past Medical History:  Diagnosis Date   Anxiety    Arthritis    hand   Complication of anesthesia 03/18/2022   Hypoxia in pacu. See pacu note 03/18/2022. The hospitalist was consulted for further work-up.   Depression    Diabetes mellitus without complication (HCC)    Dyspnea    DOE   Hyperlipemia    Hypertension    Neuropathy    Sleep apnea    Past Surgical History:  Procedure Laterality Date   ABDOMINAL HYSTERECTOMY  1995   BIOPSY  07/10/2023   Procedure: BIOPSY;  Surgeon: Emma Buckle, DO;  Location: [facility] ENDOSCOPY;  Service: Gastroenterology;;   CATARACT EXTRACTION W/PHACO Left 09/25/2017   Procedure: CATARACT EXTRACTION PHACO AND INTRAOCULAR LENS PLACEMENT (IOC);  Surgeon: Emma College, MD;  Location: [facility] ORS;  Service: Ophthalmology;  Laterality: Left;   flui pack lot # [id number]  US  00:32.7 AP%   12.17 CDE   3.95   CATARACT EXTRACTION W/PHACO Right 10/16/2017   Procedure: CATARACT EXTRACTION PHACO AND INTRAOCULAR LENS PLACEMENT (IOC);  Surgeon: Emma College, MD;  Location: [facility] ORS;  Service: Ophthalmology;  Laterality: Right;  US  00:32.8 AP% 9.0 CDE 2.96 FLUID PACK LOT # [id number]   COLONOSCOPY WITH PROPOFOL  N/A 05/27/2016   Procedure: COLONOSCOPY WITH PROPOFOL ;  Surgeon: Emma Click, MD;  Location: [facility] ENDOSCOPY;  Service: Endoscopy;  Laterality: N/A;   COLONOSCOPY WITH PROPOFOL  N/A 07/10/2021   Procedure: COLONOSCOPY WITH PROPOFOL ;  Surgeon:  Emma Darling, MD;  Location: [facility] ENDOSCOPY;  Service: Endoscopy;  Laterality: N/A;   COLONOSCOPY WITH PROPOFOL  N/A 10/30/2021   Procedure: COLONOSCOPY WITH PROPOFOL ;  Surgeon: Emma Darling, MD;  Location: [facility] ENDOSCOPY;  Service: Endoscopy;  Laterality: N/A;   COLONOSCOPY WITH PROPOFOL  N/A 02/05/2022   Procedure: COLONOSCOPY WITH PROPOFOL ;  Surgeon: Emma Darling, MD;  Location: [facility] ENDOSCOPY;  Service: Endoscopy;  Laterality: N/A;   COLONOSCOPY WITH PROPOFOL  N/A 07/10/2023   Procedure: COLONOSCOPY WITH PROPOFOL ;  Surgeon: Emma Buckle, DO;  Location: [facility] ENDOSCOPY;  Service: Gastroenterology;  Laterality: N/A;   ESOPHAGOGASTRODUODENOSCOPY (EGD) WITH PROPOFOL  N/A 07/10/2021   Procedure: ESOPHAGOGASTRODUODENOSCOPY (EGD) WITH PROPOFOL ;  Surgeon: Emma Darling, MD;  Location: [facility] ENDOSCOPY;  Service: Endoscopy;  Laterality: N/A;  IDDM   ESOPHAGOGASTRODUODENOSCOPY (EGD) WITH PROPOFOL  N/A 10/30/2021   Procedure: ESOPHAGOGASTRODUODENOSCOPY (EGD) WITH PROPOFOL ;  Surgeon: Emma Darling, MD;  Location: [facility] ENDOSCOPY;  Service: Endoscopy;  Laterality: N/A;  IDDM   EYE SURGERY Left 09/25/2017   cataract extraction   HYSTERECTOMY SUPRACERVICAL ABDOMINAL W/REMOVAL TUBES &/OR OVARIES  1995   KENALOG  INJECTION Right 10/16/2017   Procedure: KENALOG  INJECTION;  Surgeon: Emma College, MD;  Location: [facility] ORS;  Service: Ophthalmology;  Laterality: Right;   KNEE ARTHROCENTESIS Left 1981   KNEE ARTHROCENTESIS Bilateral 1992   right one in 1992  KNEE ARTHROCENTESIS Bilateral    POLYPECTOMY  07/10/2023   Procedure: POLYPECTOMY;  Surgeon: Emma Buckle, DO;  Location: [facility] ENDOSCOPY;  Service: Gastroenterology;;   XI ROBOTIC LAPAROSCOPIC ASSISTED APPENDECTOMY N/A 03/18/2022   Procedure: XI ROBOTIC LAPAROSCOPIC ASSISTED APPENDECTOMY;  Surgeon: Emma Grego, MD;  Location: [facility] ORS;  Service: General;  Laterality: N/A;   Patient Active Problem List    Diagnosis Date Noted   Hypoxia 03/18/2022   Hypertension    Diabetes mellitus without complication (HCC)    Depression    Anemia    History of laparoscopic appendectomy     PCP: Emma Minerva, PA   REFERRING PROVIDER: Tommas Fragmin Dipakkumar, MD  REFERRING DIAG: [phone] (ICD-10-CM) - Other intervertebral disc degeneration, lumbar region without mention of lumbar back pain or lower extremity pain  Rationale for Evaluation and Treatment: Rehabilitation  THERAPY DIAG:  Other low back pain  Radiculopathy, lumbar region  Difficulty in walking, not elsewhere classified  ONSET DATE: December 2024  SUBJECTIVE:                                                                                                                                                                                           SUBJECTIVE STATEMENT: Back is a 5/10 currently. Its good. Was busy all day yesterday, standing up. Has R LE symptoms (L5 dermatome to leg and foot as well as posterior thigh), 5/10 currently. Was good after last session.  Emma White          PERTINENT HISTORY:  Low back pain. Currently wearing a back brace which is not helping. Pt states having a bulging disc. Pain started December 2024, sudden onset, unknown mechanism of injury. Pt states having R Sciatic nerve in R posterior hip pain initially. Had x-ryas done, then and MRI which revealed bulging disc. Pain radiates along the R L5 dermatome as well as sciatic nerve (to superficial peroneal nerve distribution) to dorsal foot. R anterior ankle hurts. Pain has worsened since January 2025. Surgeon wants pt to do PT.      No latex allergies   Had L eye surgery 03/17/2024, pt states she is clear to participate in PT. 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  Blood pressure is controlled per pt.  DM is controlled per pt.  Has a CPAP machine.     PAIN:  Are you having pain? Yes: NPRS scale: 8/10 Pain location: Low back and R L5 dermatome and R  sciatic nerve distribution to R dorsal foot.  Pain description: Low back: pressure; R LE: ache, tight Aggravating factors: Sleeping on her R side, standing and walking Relieving factors: leaning  to the L, sleeping on her L side, leaning forward, wearing her back brace  PRECAUTIONS: Cannot see with L eye.     RED FLAGS: Bowel or bladder incontinence: No and Cauda equina syndrome: No   WEIGHT BEARING RESTRICTIONS: No  FALLS:  Has patient fallen in last 6 months? No  LIVING ENVIRONMENT: Lives with: lives with their spouse Lives in: House/apartment Stairs: Yes: Internal: 2 steps; none and External: 0 steps; none Has following equipment at home: Single point cane  OCCUPATION: Housewife  PLOF: Independent  PATIENT GOALS: decrease pain  NEXT MD VISIT: none at the moment  OBJECTIVE:  Note: Objective measures were completed at Evaluation unless otherwise noted.  DIAGNOSTIC FINDINGS:    PATIENT SURVEYS:  Modified Oswestry Low Back Pain Disability Questionnaire 62% (04/22/2024)  COGNITION: Overall cognitive status: Within functional limits for tasks assessed     SENSATION:   MUSCLE LENGTH:   POSTURE: forward neck, B protracted shoulders, L trunk side bend, movement preference around L4/L5 area, L weight shifting.   PALPATION: TTP R low back, increased B lumbar paraspinal muscle tension R > L  TTP R L5 > L4 > L3 TTP R low back > L side     LUMBAR ROM:   AROM eval  Flexion WFL, R posterior hip pain when returning to the upright position  Extension Very limited with R posterior hip pain  Right lateral flexion Limited with R lateral hip joint pain (worse than L lateral flexion)  Left lateral flexion Limited with R lateral hip pain  Right rotation [facility] with R posterior lateral hip pain worse than L rotation  Left rotation WFL with R posterior lateral hip pain.    (Blank rows = not tested)  LOWER EXTREMITY ROM:     Passive  Right 05/31/2024 Left 05/31/2024  Hip  flexion    Hip extension    Hip abduction    Hip adduction    Hip internal rotation 37 18  Hip external rotation 37 with R low back pain (different pain) 34  Knee flexion    Knee extension    Ankle dorsiflexion    Ankle plantarflexion    Ankle inversion    Ankle eversion     (Blank rows = not tested)  LOWER EXTREMITY MMT:    MMT Right eval Left eval  Hip flexion    Hip extension (seated manually resisted) 3+ 3+  Hip abduction (seated manually resisted) 4- 4-  Hip adduction    Hip internal rotation    Hip external rotation    Knee flexion 4 4  Knee extension 4+ 4+  Ankle dorsiflexion    Ankle plantarflexion    Ankle inversion    Ankle eversion     (Blank rows = not tested)  LUMBAR SPECIAL TESTS:    FUNCTIONAL TESTS:    GAIT: Distance walked: 30 ft Assistive device utilized: None Level of assistance: SBA Comments: Decreased stance R LE, antalgic with R trunk side bend during R LE stance phase, decreased B knee extension during stance phase, decreased foot clearance and gait velocity.   TREATMENT DATE: 05/31/2024  Neuromuscular Re-education   Seated hip adduction isometric folded pillow squeeze 10x3 with 5 second holds  Slight decrease in R LE symptoms.    Supine L piriformis stretch with towel 30 seconds x 5  Supine L hip IR stretch with PT 1 min x 3  Hooklying   Posterior pelvic tilt 4x  with 20 second holds   R posterior thigh symptoms, eases with rest   Crunch 5x5   No R posterior thigh symptoms  Supine R hip IR stretch with PT 1 min x 3  R sciatic symptoms reproduced.         Improved exercise technique, movement at target joints, use of target muscles after mod verbal, visual, tactile cues.     PATIENT EDUCATION:  Education details: there-ex, HEP, POC Person educated: Patient Education method: Explanation,  Demonstration, Tactile cues, Verbal cues, and Handouts Education comprehension: verbalized understanding and returned demonstration  HOME EXERCISE PROGRAM: Access Code: [email or web address] URL: [email or web address] Date: 04/22/2024 Prepared by: Suzzane Estes  Exercises - Seated Transversus Abdominis Bracing  - 3 x daily - 7 x weekly - 3 sets - 10 reps - 5 seconds hold - Seated Gluteal Sets  - 3 x daily - 7 x weekly - 3 sets - 10 reps - 5 seconds hold   Reclined, hooklying hip extension isometrics with leg straight  R 10x5 seconds for 3 sets daily.  Drawing provided.      - Supine Transversus Abdominis Bracing - Hands on Stomach  - 5 x daily - 7 x weekly - 3 sets - 10 reps - 5 seconds hold - Supine Posterior Pelvic Tilt  - 2 x daily - 7 x weekly - 3 sets - 5 reps - 15 seconds hold  - Bent Knee Fallouts with Alternating Legs  - 1 x daily - 7 x weekly - 3 sets - 5 reps  - Supine Piriformis Stretch with Towel  - 3 x daily - 7 x weekly - 1 sets - 5 reps - 30 seconds hold     ASSESSMENT:  CLINICAL IMPRESSION: Worked on hip IR ROM to help decrease stress to low back, reproduction of R posterior hip symptoms with posterior pelvic tilt and R piriformis stretch. Worked improving R hip IR and decreasing R piriformis muscle tension to help address. Pt tolerated session well without aggravation of symptoms. Pt will benefit from continued skilled physical therapy services to decrease pain, improve strength and function.       OBJECTIVE IMPAIRMENTS: Abnormal gait, decreased balance, difficulty walking, decreased strength, improper body mechanics, postural dysfunction, and pain.   ACTIVITY LIMITATIONS: carrying, lifting, bending, sitting, standing, squatting, sleeping, stairs, transfers, bed mobility, dressing, hygiene/grooming, and locomotion level  PARTICIPATION LIMITATIONS:   PERSONAL FACTORS: Age, Fitness, Time since onset of injury/illness/exacerbation, and 3+ comorbidities:  anxiety, arthritis, depression, DM, HTN, sleep apnea are also affecting patient's functional outcome.   REHAB POTENTIAL: Fair    CLINICAL DECISION MAKING: Evolving/moderate complexity Pain has worsened since onset per pt.   EVALUATION COMPLEXITY: Moderate   GOALS: Goals reviewed with patient? Yes  SHORT TERM GOALS: Target date: 05/07/2024  Pt will be independent with her initial HEP to decrease pain, improve strength, function, and ability to perform standing tasks more comfortably.  Baseline: Pt has started her initial HEP (04/22/2024) Goal status: INITIAL     LONG TERM GOALS: Target date: 06/18/2024  Pt will have a decrease in low back pain to 4/10 or less at worst to promote ability to perform standing  tasks more comfortably.  Baseline:8/10 low back pain at worst for the past 3 months (04/22/2024)  Goal status: INITIAL  2.  Pt will have a decrease in R LE pain to 4/10 or less at worst to promote ability to ambulate and perform standing tasks more comfortably.  Baseline: 9/10 R LE pain at worst for the past 3 months. (04/22/2024) Goal status: INITIAL  3.  Pt will improve her Modified Oswestry Low Back Pain Disability Questionnaire by at least 20% as a demonstration of improved function.  Baseline: Modified Oswestry Low Back Pain Disability Questionnaire 62% (04/22/2024) Goal status: INITIAL  4.  Pt will improve her B hip extension and abduction strength by at least 1/2 MMT grade to promote ability to ambulate and perform standing tasks more comfortably.  Baseline:  MMT Right eval Left eval  Hip extension (seated manually resisted) 3+ 3+  Hip abduction (seated manually resisted) 4- 4-   (04/22/2024)  Goal status: INITIAL   PLAN:  PT FREQUENCY: 1-2x/week  PT DURATION: 8 weeks  PLANNED INTERVENTIONS: 97110-Therapeutic exercises, 97530- Therapeutic activity, 97112- Neuromuscular re-education, 97535- Self Care, 32440- Manual therapy, [phone]- Gait training, [phone]- Aquatic Therapy,  [phone]- Electrical stimulation (unattended), [phone]- Traction (mechanical), [id number]- Ionotophoresis 4mg /ml Dexamethasone , Patient/Family education, Balance training, Stair training, Dry Needling, Joint mobilization, and Spinal mobilization.  PLAN FOR NEXT SESSION: Posture, trunk and glute strengthening, manual techniques, modalities PRN   Elizabeth Paulsen, PT, DPT 05/31/2024, 9:59 AM

## 2024-06-02 ENCOUNTER — Ambulatory Visit

## 2024-06-02 DIAGNOSIS — M5459 Other low back pain: Secondary | ICD-10-CM

## 2024-06-02 DIAGNOSIS — R262 Difficulty in walking, not elsewhere classified: Secondary | ICD-10-CM

## 2024-06-02 DIAGNOSIS — M5416 Radiculopathy, lumbar region: Secondary | ICD-10-CM

## 2024-06-02 NOTE — Therapy (Signed)
 OUTPATIENT PHYSICAL THERAPY TREATMENT   Patient Name: Emma White MRN: [id number] DOB:October 28, 1956, 68 y.o., female Today's Date: 06/02/2024  END OF SESSION:  PT End of Session - 06/02/24 1121     Visit Number 9    Number of Visits 17    Date for PT Re-Evaluation 06/18/24    PT Start Time 1121    PT Stop Time 1159    PT Time Calculation (min) 38 min    Activity Tolerance Patient tolerated treatment well    Behavior During Therapy [facility] for tasks assessed/performed                  Past Medical History:  Diagnosis Date   Anxiety    Arthritis    hand   Complication of anesthesia 03/18/2022   Hypoxia in pacu. See pacu note 03/18/2022. The hospitalist was consulted for further work-up.   Depression    Diabetes mellitus without complication (HCC)    Dyspnea    DOE   Hyperlipemia    Hypertension    Neuropathy    Sleep apnea    Past Surgical History:  Procedure Laterality Date   ABDOMINAL HYSTERECTOMY  1995   BIOPSY  07/10/2023   Procedure: BIOPSY;  Surgeon: Quintin Buckle, DO;  Location: [facility] ENDOSCOPY;  Service: Gastroenterology;;   CATARACT EXTRACTION W/PHACO Left 09/25/2017   Procedure: CATARACT EXTRACTION PHACO AND INTRAOCULAR LENS PLACEMENT (IOC);  Surgeon: Rosa College, MD;  Location: [facility] ORS;  Service: Ophthalmology;  Laterality: Left;   flui pack lot # [id number]  US  00:32.7 AP%   12.17 CDE   3.95   CATARACT EXTRACTION W/PHACO Right 10/16/2017   Procedure: CATARACT EXTRACTION PHACO AND INTRAOCULAR LENS PLACEMENT (IOC);  Surgeon: Rosa College, MD;  Location: [facility] ORS;  Service: Ophthalmology;  Laterality: Right;  US  00:32.8 AP% 9.0 CDE 2.96 FLUID PACK LOT # [id number]   COLONOSCOPY WITH PROPOFOL  N/A 05/27/2016   Procedure: COLONOSCOPY WITH PROPOFOL ;  Surgeon: Cassie Click, MD;  Location: [facility] ENDOSCOPY;  Service: Endoscopy;  Laterality: N/A;   COLONOSCOPY WITH PROPOFOL  N/A 07/10/2021   Procedure: COLONOSCOPY WITH PROPOFOL ;  Surgeon:  Shane Darling, MD;  Location: [facility] ENDOSCOPY;  Service: Endoscopy;  Laterality: N/A;   COLONOSCOPY WITH PROPOFOL  N/A 10/30/2021   Procedure: COLONOSCOPY WITH PROPOFOL ;  Surgeon: Shane Darling, MD;  Location: [facility] ENDOSCOPY;  Service: Endoscopy;  Laterality: N/A;   COLONOSCOPY WITH PROPOFOL  N/A 02/05/2022   Procedure: COLONOSCOPY WITH PROPOFOL ;  Surgeon: Shane Darling, MD;  Location: [facility] ENDOSCOPY;  Service: Endoscopy;  Laterality: N/A;   COLONOSCOPY WITH PROPOFOL  N/A 07/10/2023   Procedure: COLONOSCOPY WITH PROPOFOL ;  Surgeon: Quintin Buckle, DO;  Location: [facility] ENDOSCOPY;  Service: Gastroenterology;  Laterality: N/A;   ESOPHAGOGASTRODUODENOSCOPY (EGD) WITH PROPOFOL  N/A 07/10/2021   Procedure: ESOPHAGOGASTRODUODENOSCOPY (EGD) WITH PROPOFOL ;  Surgeon: Shane Darling, MD;  Location: [facility] ENDOSCOPY;  Service: Endoscopy;  Laterality: N/A;  IDDM   ESOPHAGOGASTRODUODENOSCOPY (EGD) WITH PROPOFOL  N/A 10/30/2021   Procedure: ESOPHAGOGASTRODUODENOSCOPY (EGD) WITH PROPOFOL ;  Surgeon: Shane Darling, MD;  Location: [facility] ENDOSCOPY;  Service: Endoscopy;  Laterality: N/A;  IDDM   EYE SURGERY Left 09/25/2017   cataract extraction   HYSTERECTOMY SUPRACERVICAL ABDOMINAL W/REMOVAL TUBES &/OR OVARIES  1995   KENALOG  INJECTION Right 10/16/2017   Procedure: KENALOG  INJECTION;  Surgeon: Rosa College, MD;  Location: [facility] ORS;  Service: Ophthalmology;  Laterality: Right;   KNEE ARTHROCENTESIS Left 1981   KNEE ARTHROCENTESIS Bilateral 1992   right one in  1992   KNEE ARTHROCENTESIS Bilateral    POLYPECTOMY  07/10/2023   Procedure: POLYPECTOMY;  Surgeon: Quintin Buckle, DO;  Location: [facility] ENDOSCOPY;  Service: Gastroenterology;;   XI ROBOTIC LAPAROSCOPIC ASSISTED APPENDECTOMY N/A 03/18/2022   Procedure: XI ROBOTIC LAPAROSCOPIC ASSISTED APPENDECTOMY;  Surgeon: Eldred Grego, MD;  Location: [facility] ORS;  Service: General;  Laterality: N/A;   Patient Active Problem List    Diagnosis Date Noted   Hypoxia 03/18/2022   Hypertension    Diabetes mellitus without complication (HCC)    Depression    Anemia    History of laparoscopic appendectomy     PCP: Jefferey Minerva, PA   REFERRING PROVIDER: Tommas Fragmin Dipakkumar, MD  REFERRING DIAG: [phone] (ICD-10-CM) - Other intervertebral disc degeneration, lumbar region without mention of lumbar back pain or lower extremity pain  Rationale for Evaluation and Treatment: Rehabilitation  THERAPY DIAG:  Other low back pain  Radiculopathy, lumbar region  Difficulty in walking, not elsewhere classified  ONSET DATE: December 2024  SUBJECTIVE:                                                                                                                                                                                           SUBJECTIVE STATEMENT: Husband says she is walking better. Back pain is about 4.5/10 currently. R LE pain (ankle and top of her foot) did not last long yesterday.    [name]          PERTINENT HISTORY:  Low back pain. Currently wearing a back brace which is not helping. Pt states having a bulging disc. Pain started December 2024, sudden onset, unknown mechanism of injury. Pt states having R Sciatic nerve in R posterior hip pain initially. Had x-ryas done, then and MRI which revealed bulging disc. Pain radiates along the R L5 dermatome as well as sciatic nerve (to superficial peroneal nerve distribution) to dorsal foot. R anterior ankle hurts. Pain has worsened since January 2025. Surgeon wants pt to do PT.      No latex allergies   Had L eye surgery 03/17/2024, pt states she is clear to participate in PT. 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  Blood pressure is controlled per pt.  DM is controlled per pt.  Has a CPAP machine.     PAIN:  Are you having pain? Yes: NPRS scale: 8/10 Pain location: Low back and R L5 dermatome and R sciatic nerve distribution to R dorsal foot.  Pain  description: Low back: pressure; R LE: ache, tight Aggravating factors: Sleeping on her R side, standing and walking Relieving factors: leaning to the L, sleeping on  her L side, leaning forward, wearing her back brace  PRECAUTIONS: Cannot see with L eye.     RED FLAGS: Bowel or bladder incontinence: No and Cauda equina syndrome: No   WEIGHT BEARING RESTRICTIONS: No  FALLS:  Has patient fallen in last 6 months? No  LIVING ENVIRONMENT: Lives with: lives with their spouse Lives in: House/apartment Stairs: Yes: Internal: 2 steps; none and External: 0 steps; none Has following equipment at home: Single point cane  OCCUPATION: Housewife  PLOF: Independent  PATIENT GOALS: decrease pain  NEXT MD VISIT: none at the moment  OBJECTIVE:  Note: Objective measures were completed at Evaluation unless otherwise noted.  DIAGNOSTIC FINDINGS:    PATIENT SURVEYS:  Modified Oswestry Low Back Pain Disability Questionnaire 62% (04/22/2024)  COGNITION: Overall cognitive status: Within functional limits for tasks assessed     SENSATION:   MUSCLE LENGTH:   POSTURE: forward neck, B protracted shoulders, L trunk side bend, movement preference around L4/L5 area, L weight shifting.   PALPATION: TTP R low back, increased B lumbar paraspinal muscle tension R > L  TTP R L5 > L4 > L3 TTP R low back > L side     LUMBAR ROM:   AROM eval  Flexion WFL, R posterior hip pain when returning to the upright position  Extension Very limited with R posterior hip pain  Right lateral flexion Limited with R lateral hip joint pain (worse than L lateral flexion)  Left lateral flexion Limited with R lateral hip pain  Right rotation [facility] with R posterior lateral hip pain worse than L rotation  Left rotation WFL with R posterior lateral hip pain.    (Blank rows = not tested)  LOWER EXTREMITY ROM:     Passive  Right 05/31/2024 Left 05/31/2024  Hip flexion    Hip extension    Hip abduction    Hip  adduction    Hip internal rotation 37 18  Hip external rotation 37 with R low back pain (different pain) 34  Knee flexion    Knee extension    Ankle dorsiflexion    Ankle plantarflexion    Ankle inversion    Ankle eversion     (Blank rows = not tested)  LOWER EXTREMITY MMT:    MMT Right eval Left eval  Hip flexion    Hip extension (seated manually resisted) 3+ 3+  Hip abduction (seated manually resisted) 4- 4-  Hip adduction    Hip internal rotation    Hip external rotation    Knee flexion 4 4  Knee extension 4+ 4+  Ankle dorsiflexion    Ankle plantarflexion    Ankle inversion    Ankle eversion     (Blank rows = not tested)  LUMBAR SPECIAL TESTS:    FUNCTIONAL TESTS:    GAIT: Distance walked: 30 ft Assistive device utilized: None Level of assistance: SBA Comments: Decreased stance R LE, antalgic with R trunk side bend during R LE stance phase, decreased B knee extension during stance phase, decreased foot clearance and gait velocity.   TREATMENT DATE: 06/01/2024  Neuromuscular Re-education   Seated hip adduction isometric folded pillow squeeze 10x3 with 5 second holds  Slight decrease in R LE symptoms.    Seated L piriformis stretch unable to perform.   Standing posterior pelvic tilt  10x10 seconds for 2 sets   Standing hip flexor stretch onto stair step with B UE assist   R 30 seconds x 2   R posterior leg tightness, eases with rest   L 30 seconds x 2   L posterior leg tightness, eases with rest.   Supine L hip IR stretch with PT 1 min x 3   Hooklying   Crunch 5x6   Posterior pelvic tilt with march 10x2 each LE   L posterior thigh cramp, eases of with rest with L leg straight.       Improved exercise technique, movement at target joints, use of target muscles after mod verbal, visual, tactile cues.     PATIENT  EDUCATION:  Education details: there-ex, HEP, POC Person educated: Patient Education method: Explanation, Demonstration, Tactile cues, Verbal cues, and Handouts Education comprehension: verbalized understanding and returned demonstration  HOME EXERCISE PROGRAM: Access Code: [email or web address] URL: [email or web address] Date: 04/22/2024 Prepared by: Suzzane Estes  Exercises - Seated Transversus Abdominis Bracing  - 3 x daily - 7 x weekly - 3 sets - 10 reps - 5 seconds hold - Seated Gluteal Sets  - 3 x daily - 7 x weekly - 3 sets - 10 reps - 5 seconds hold   Reclined, hooklying hip extension isometrics with leg straight  R 10x5 seconds for 3 sets daily.  Drawing provided.      - Supine Transversus Abdominis Bracing - Hands on Stomach  - 5 x daily - 7 x weekly - 3 sets - 10 reps - 5 seconds hold - Supine Posterior Pelvic Tilt  - 2 x daily - 7 x weekly - 3 sets - 5 reps - 15 seconds hold  - Bent Knee Fallouts with Alternating Legs  - 1 x daily - 7 x weekly - 3 sets - 5 reps  - Supine Piriformis Stretch with Towel  - 3 x daily - 7 x weekly - 1 sets - 5 reps - 30 seconds hold     ASSESSMENT:  CLINICAL IMPRESSION:  Continued working on improving trunk strength and hip mobility/ROM to decrease stress to low back. Back feels better after session reported.  Pt tolerated session well without aggravation of symptoms. Pt will benefit from continued skilled physical therapy services to decrease pain, improve strength and function.       OBJECTIVE IMPAIRMENTS: Abnormal gait, decreased balance, difficulty walking, decreased strength, improper body mechanics, postural dysfunction, and pain.   ACTIVITY LIMITATIONS: carrying, lifting, bending, sitting, standing, squatting, sleeping, stairs, transfers, bed mobility, dressing, hygiene/grooming, and locomotion level  PARTICIPATION LIMITATIONS:   PERSONAL FACTORS: Age, Fitness, Time since onset of injury/illness/exacerbation, and 3+  comorbidities: anxiety, arthritis, depression, DM, HTN, sleep apnea are also affecting patient's functional outcome.   REHAB POTENTIAL: Fair    CLINICAL DECISION MAKING: Evolving/moderate complexity Pain has worsened since onset per pt.   EVALUATION COMPLEXITY: Moderate   GOALS: Goals reviewed with patient? Yes  SHORT TERM GOALS: Target date: 05/07/2024  Pt will be independent with her initial HEP to decrease pain, improve strength, function, and ability to perform standing tasks more comfortably.  Baseline: Pt has started her initial HEP (04/22/2024); doing her HEP, no questions (06/02/2024) Goal status: MET     LONG TERM GOALS: Target date:  06/18/2024  Pt will have a decrease in low back pain to 4/10 or less at worst to promote ability to perform standing tasks more comfortably.  Baseline:8/10 low back pain at worst for the past 3 months (04/22/2024); 5/10 back pain at most for the past 7 days, also not currently wearing her back brace (06/02/2024) Goal status: PROGRESSING  2.  Pt will have a decrease in R LE pain to 4/10 or less at worst to promote ability to ambulate and perform standing tasks more comfortably.  Baseline: 9/10 R LE pain at worst for the past 3 months. (04/22/2024); 5/10 R LE pain at most for the past 7 days, does not hurt often like it used to (06/02/2024) Goal status: PROGRESSING  3.  Pt will improve her Modified Oswestry Low Back Pain Disability Questionnaire by at least 20% as a demonstration of improved function.  Baseline: Modified Oswestry Low Back Pain Disability Questionnaire 62% (04/22/2024) Goal status: INITIAL  4.  Pt will improve her B hip extension and abduction strength by at least 1/2 MMT grade to promote ability to ambulate and perform standing tasks more comfortably.  Baseline:  MMT Right eval Left eval  Hip extension (seated manually resisted) 3+ 3+  Hip abduction (seated manually resisted) 4- 4-   (04/22/2024)  Goal status: INITIAL   PLAN:  PT  FREQUENCY: 1-2x/week  PT DURATION: 8 weeks  PLANNED INTERVENTIONS: 97110-Therapeutic exercises, 97530- Therapeutic activity, 97112- Neuromuscular re-education, 97535- Self Care, 09604- Manual therapy, [phone]- Gait training, [phone]- Aquatic Therapy, [phone]- Electrical stimulation (unattended), [phone]- Traction (mechanical), [id number]- Ionotophoresis 4mg /ml Dexamethasone , Patient/Family education, Balance training, Stair training, Dry Needling, Joint mobilization, and Spinal mobilization.  PLAN FOR NEXT SESSION: Posture, trunk and glute strengthening, manual techniques, modalities PRN   Daesia Zylka, PT, DPT 06/02/2024, 12:16 PM

## 2024-06-07 ENCOUNTER — Ambulatory Visit

## 2024-06-07 DIAGNOSIS — M5416 Radiculopathy, lumbar region: Secondary | ICD-10-CM

## 2024-06-07 DIAGNOSIS — M5459 Other low back pain: Secondary | ICD-10-CM

## 2024-06-07 DIAGNOSIS — R262 Difficulty in walking, not elsewhere classified: Secondary | ICD-10-CM

## 2024-06-07 NOTE — Therapy (Signed)
 OUTPATIENT PHYSICAL THERAPY TREATMENT And Progress Report (04/22/2024 - 06/07/2024)   Patient Name: Emma White MRN: [id number] DOB:Jun 08, 1956, 68 y.o., female Today's Date: 06/07/2024  END OF SESSION:  PT End of Session - 06/07/24 0944     Visit Number 10    Number of Visits 17    Date for PT Re-Evaluation 06/18/24    PT Start Time 0944    PT Stop Time 1030    PT Time Calculation (min) 46 min    Activity Tolerance Patient tolerated treatment well    Behavior During Therapy [facility] for tasks assessed/performed                   Past Medical History:  Diagnosis Date   Anxiety    Arthritis    hand   Complication of anesthesia 03/18/2022   Hypoxia in pacu. See pacu note 03/18/2022. The hospitalist was consulted for further work-up.   Depression    Diabetes mellitus without complication (HCC)    Dyspnea    DOE   Hyperlipemia    Hypertension    Neuropathy    Sleep apnea    Past Surgical History:  Procedure Laterality Date   ABDOMINAL HYSTERECTOMY  1995   BIOPSY  07/10/2023   Procedure: BIOPSY;  Surgeon: Onita Elspeth Sharper, DO;  Location: [facility] ENDOSCOPY;  Service: Gastroenterology;;   CATARACT EXTRACTION W/PHACO Left 09/25/2017   Procedure: CATARACT EXTRACTION PHACO AND INTRAOCULAR LENS PLACEMENT (IOC);  Surgeon: Myrna Adine Anes, MD;  Location: [facility] ORS;  Service: Ophthalmology;  Laterality: Left;   flui pack lot # [id number]  US  00:32.7 AP%   12.17 CDE   3.95   CATARACT EXTRACTION W/PHACO Right 10/16/2017   Procedure: CATARACT EXTRACTION PHACO AND INTRAOCULAR LENS PLACEMENT (IOC);  Surgeon: Myrna Adine Anes, MD;  Location: [facility] ORS;  Service: Ophthalmology;  Laterality: Right;  US  00:32.8 AP% 9.0 CDE 2.96 FLUID PACK LOT # [id number]   COLONOSCOPY WITH PROPOFOL  N/A 05/27/2016   Procedure: COLONOSCOPY WITH PROPOFOL ;  Surgeon: Lamar ONEIDA Holmes, MD;  Location: [facility] ENDOSCOPY;  Service: Endoscopy;  Laterality: N/A;   COLONOSCOPY WITH PROPOFOL  N/A 07/10/2021    Procedure: COLONOSCOPY WITH PROPOFOL ;  Surgeon: Maryruth Ole ONEIDA, MD;  Location: [facility] ENDOSCOPY;  Service: Endoscopy;  Laterality: N/A;   COLONOSCOPY WITH PROPOFOL  N/A 10/30/2021   Procedure: COLONOSCOPY WITH PROPOFOL ;  Surgeon: Maryruth Ole ONEIDA, MD;  Location: [facility] ENDOSCOPY;  Service: Endoscopy;  Laterality: N/A;   COLONOSCOPY WITH PROPOFOL  N/A 02/05/2022   Procedure: COLONOSCOPY WITH PROPOFOL ;  Surgeon: Maryruth Ole ONEIDA, MD;  Location: [facility] ENDOSCOPY;  Service: Endoscopy;  Laterality: N/A;   COLONOSCOPY WITH PROPOFOL  N/A 07/10/2023   Procedure: COLONOSCOPY WITH PROPOFOL ;  Surgeon: Onita Elspeth Sharper, DO;  Location: [facility] ENDOSCOPY;  Service: Gastroenterology;  Laterality: N/A;   ESOPHAGOGASTRODUODENOSCOPY (EGD) WITH PROPOFOL  N/A 07/10/2021   Procedure: ESOPHAGOGASTRODUODENOSCOPY (EGD) WITH PROPOFOL ;  Surgeon: Maryruth Ole ONEIDA, MD;  Location: [facility] ENDOSCOPY;  Service: Endoscopy;  Laterality: N/A;  IDDM   ESOPHAGOGASTRODUODENOSCOPY (EGD) WITH PROPOFOL  N/A 10/30/2021   Procedure: ESOPHAGOGASTRODUODENOSCOPY (EGD) WITH PROPOFOL ;  Surgeon: Maryruth Ole ONEIDA, MD;  Location: [facility] ENDOSCOPY;  Service: Endoscopy;  Laterality: N/A;  IDDM   EYE SURGERY Left 09/25/2017   cataract extraction   HYSTERECTOMY SUPRACERVICAL ABDOMINAL W/REMOVAL TUBES &/OR OVARIES  1995   KENALOG  INJECTION Right 10/16/2017   Procedure: KENALOG  INJECTION;  Surgeon: Myrna Adine Anes, MD;  Location: [facility] ORS;  Service: Ophthalmology;  Laterality: Right;   KNEE ARTHROCENTESIS Left 1981   KNEE ARTHROCENTESIS  Bilateral 1992   right one in 1992   KNEE ARTHROCENTESIS Bilateral    POLYPECTOMY  07/10/2023   Procedure: POLYPECTOMY;  Surgeon: Onita Elspeth Sharper, DO;  Location: [facility] ENDOSCOPY;  Service: Gastroenterology;;   XI ROBOTIC LAPAROSCOPIC ASSISTED APPENDECTOMY N/A 03/18/2022   Procedure: XI ROBOTIC LAPAROSCOPIC ASSISTED APPENDECTOMY;  Surgeon: Rodolph Romano, MD;  Location: [facility] ORS;  Service: General;   Laterality: N/A;   Patient Active Problem List   Diagnosis Date Noted   Hypoxia 03/18/2022   Hypertension    Diabetes mellitus without complication (HCC)    Depression    Anemia    History of laparoscopic appendectomy     PCP: Autry Grayce LABOR, PA   REFERRING PROVIDER: Rocco Organ Dipakkumar, MD  REFERRING DIAG: [phone] (ICD-10-CM) - Other intervertebral disc degeneration, lumbar region without mention of lumbar back pain or lower extremity pain  Rationale for Evaluation and Treatment: Rehabilitation  THERAPY DIAG:  Other low back pain  Radiculopathy, lumbar region  Difficulty in walking, not elsewhere classified  ONSET DATE: December 2024  SUBJECTIVE:                                                                                                                                                                                           SUBJECTIVE STATEMENT: Back still hurts in his R LE. R LE started bothering her yesterday. 5.5/10 low back pain currently. 5/10 R L5 dermatome pain currently which has been hurting since yesterday. Has been taking Tylenol . R calf was sore after last session.     PERTINENT HISTORY:  Low back pain. Currently wearing a back brace which is not helping. Pt states having a bulging disc. Pain started December 2024, sudden onset, unknown mechanism of injury. Pt states having R Sciatic nerve in R posterior hip pain initially. Had x-ryas done, then and MRI which revealed bulging disc. Pain radiates along the R L5 dermatome as well as sciatic nerve (to superficial peroneal nerve distribution) to dorsal foot. R anterior ankle hurts. Pain has worsened since January 2025. Surgeon wants pt to do PT.      No latex allergies   Had L eye surgery 03/17/2024, pt states she is clear to participate in PT. 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  Blood pressure is controlled per pt.  DM is controlled per pt.  Has a CPAP machine.     PAIN:  Are you having pain?  Yes: NPRS scale: 8/10 Pain location: Low back and R L5 dermatome and R sciatic nerve distribution to R dorsal foot.  Pain description: Low back: pressure; R LE: ache, tight Aggravating factors: Sleeping on her  R side, standing and walking Relieving factors: leaning to the L, sleeping on her L side, leaning forward, wearing her back brace  PRECAUTIONS: Cannot see with L eye.     RED FLAGS: Bowel or bladder incontinence: No and Cauda equina syndrome: No   WEIGHT BEARING RESTRICTIONS: No  FALLS:  Has patient fallen in last 6 months? No  LIVING ENVIRONMENT: Lives with: lives with their spouse Lives in: House/apartment Stairs: Yes: Internal: 2 steps; none and External: 0 steps; none Has following equipment at home: Single point cane  OCCUPATION: Housewife  PLOF: Independent  PATIENT GOALS: decrease pain  NEXT MD VISIT: none at the moment  OBJECTIVE:  Note: Objective measures were completed at Evaluation unless otherwise noted.  DIAGNOSTIC FINDINGS:    PATIENT SURVEYS:  Modified Oswestry Low Back Pain Disability Questionnaire 62% (04/22/2024)  COGNITION: Overall cognitive status: Within functional limits for tasks assessed     SENSATION:   MUSCLE LENGTH:   POSTURE: forward neck, B protracted shoulders, L trunk side bend, movement preference around L4/L5 area, L weight shifting.   PALPATION: TTP R low back, increased B lumbar paraspinal muscle tension R > L  TTP R L5 > L4 > L3 TTP R low back > L side     LUMBAR ROM:   AROM eval  Flexion WFL, R posterior hip pain when returning to the upright position  Extension Very limited with R posterior hip pain  Right lateral flexion Limited with R lateral hip joint pain (worse than L lateral flexion)  Left lateral flexion Limited with R lateral hip pain  Right rotation [facility] with R posterior lateral hip pain worse than L rotation  Left rotation WFL with R posterior lateral hip pain.    (Blank rows = not  tested)  LOWER EXTREMITY ROM:     Passive  Right 05/31/2024 Left 05/31/2024  Hip flexion    Hip extension    Hip abduction    Hip adduction    Hip internal rotation 37 18  Hip external rotation 37 with R low back pain (different pain) 34  Knee flexion    Knee extension    Ankle dorsiflexion    Ankle plantarflexion    Ankle inversion    Ankle eversion     (Blank rows = not tested)  LOWER EXTREMITY MMT:    MMT Right eval Left eval  Hip flexion    Hip extension (seated manually resisted) 3+ 3+  Hip abduction (seated manually resisted) 4- 4-  Hip adduction    Hip internal rotation    Hip external rotation    Knee flexion 4 4  Knee extension 4+ 4+  Ankle dorsiflexion    Ankle plantarflexion    Ankle inversion    Ankle eversion     (Blank rows = not tested)  LUMBAR SPECIAL TESTS:    FUNCTIONAL TESTS:    GAIT: Distance walked: 30 ft Assistive device utilized: None Level of assistance: SBA Comments: Decreased stance R LE, antalgic with R trunk side bend during R LE stance phase, decreased B knee extension during stance phase, decreased foot clearance and gait velocity.   TREATMENT DATE: 06/07/2024  Therapeutic Activities  Seated manually resisted hip extension, hip abduction  Reviewed progress with PT with pt towards goals.     Standing hip flexor stretch onto stair step with B UE assist   R 30 seconds x 2   R posterior leg symptoms. Slowly eases with rest   L 30 seconds x 3    Standing posterior pelvic tilt  10x10 seconds  R LE symptoms.   Sitting with lumbar towel roll 2 min  R LE feels better per pt. No Pain in R LE afterwards   Then with gentle lumbar extension 10x5 seconds for 3 sets  Standing static mini lunge with contralateral UE assis t  R 4x. R anterior leg symptoms.   L 10x  Forward step up onto first regular step  with B UE assist   R 10x3  L 10x3   Good glute muscle use felt, no R LE symptoms         Improved exercise technique, movement at target joints, use of target muscles after mod verbal, visual, tactile cues.     PATIENT EDUCATION:  Education details: there-ex, HEP, POC Person educated: Patient Education method: Explanation, Demonstration, Tactile cues, Verbal cues, and Handouts Education comprehension: verbalized understanding and returned demonstration  HOME EXERCISE PROGRAM: Access Code: [email or web address] URL: [email or web address] Date: 04/22/2024 Prepared by: Emil Glassman  Exercises - Seated Transversus Abdominis Bracing  - 3 x daily - 7 x weekly - 3 sets - 10 reps - 5 seconds hold - Seated Gluteal Sets  - 3 x daily - 7 x weekly - 3 sets - 10 reps - 5 seconds hold   Reclined, hooklying hip extension isometrics with leg straight  R 10x5 seconds for 3 sets daily.  Drawing provided.      - Supine Transversus Abdominis Bracing - Hands on Stomach  - 5 x daily - 7 x weekly - 3 sets - 10 reps - 5 seconds hold - Supine Posterior Pelvic Tilt  - 2 x daily - 7 x weekly - 3 sets - 5 reps - 15 seconds hold  - Bent Knee Fallouts with Alternating Legs  - 1 x daily - 7 x weekly - 3 sets - 5 reps  - Supine Piriformis Stretch with Towel  - 3 x daily - 7 x weekly - 1 sets - 5 reps - 30 seconds hold   Sitting with lumbar towel roll 2 min   ASSESSMENT:  CLINICAL IMPRESSION:  Pt demonstrates decreasing low back and R LE pain, as well as improved B hip strength since initial evaluation. Demonstrates extension directional preference today for low back and R LE symptoms with decreased pain reported after performing gentle lumbar extension based exercises. Worked on glute strengthening to help decrease stress to low back. Pt will benefit from continued skilled physical therapy services to decrease pain, improve strength and function.       OBJECTIVE IMPAIRMENTS: Abnormal gait,  decreased balance, difficulty walking, decreased strength, improper body mechanics, postural dysfunction, and pain.   ACTIVITY LIMITATIONS: carrying, lifting, bending, sitting, standing, squatting, sleeping, stairs, transfers, bed mobility, dressing, hygiene/grooming, and locomotion level  PARTICIPATION LIMITATIONS:   PERSONAL FACTORS: Age, Fitness, Time since onset of injury/illness/exacerbation, and 3+ comorbidities: anxiety, arthritis, depression, DM, HTN, sleep apnea are also affecting patient's functional outcome.   REHAB POTENTIAL: Fair    CLINICAL DECISION MAKING: Evolving/moderate complexity Pain has worsened since onset per pt.   EVALUATION COMPLEXITY: Moderate   GOALS: Goals reviewed with patient? Yes  SHORT  TERM GOALS: Target date: 05/07/2024  Pt will be independent with her initial HEP to decrease pain, improve strength, function, and ability to perform standing tasks more comfortably.  Baseline: Pt has started her initial HEP (04/22/2024); doing her HEP, no questions (06/02/2024) Goal status: MET   LONG TERM GOALS: Target date: 06/18/2024  Pt will have a decrease in low back pain to 4/10 or less at worst to promote ability to perform standing tasks more comfortably.  Baseline:8/10 low back pain at worst for the past 3 months (04/22/2024); 5/10 back pain at most for the past 7 days, also not currently wearing her back brace (06/02/2024), 5/10 at worst but when she vacuums, back pain goes up to a 6/10. Currently no longer using her back brace (06/07/2024) Goal status: PROGRESSING  2.  Pt will have a decrease in R LE pain to 4/10 or less at worst to promote ability to ambulate and perform standing tasks more comfortably.  Baseline: 9/10 R LE pain at worst for the past 3 months. (04/22/2024); 5/10 R LE pain at most for the past 7 days, does not hurt often like it used to (06/02/2024), 5/10 at most but when she vacuums, R LE pain goes up to a 6/10. Currently no longer using her back brace  (06/07/2024) Goal status: PROGRESSING  3.  Pt will improve her Modified Oswestry Low Back Pain Disability Questionnaire by at least 20% as a demonstration of improved function.  Baseline: Modified Oswestry Low Back Pain Disability Questionnaire 62% (04/22/2024); 46 % (06/07/2024) Goal status: PROGRESSING  4.  Pt will improve her B hip extension and abduction strength by at least 1/2 MMT grade to promote ability to ambulate and perform standing tasks more comfortably.  Baseline:  MMT Right eval Left eval R (06/07/2024) L (06/07/2024)  Hip extension (seated manually resisted) 3+ 3+ 4+ 4  Hip abduction (seated manually resisted) 4- 4- 4+ 4+   (04/22/2024)  Goal status: MET   PLAN:  PT FREQUENCY: 1-2x/week  PT DURATION: 8 weeks  PLANNED INTERVENTIONS: 97110-Therapeutic exercises, 97530- Therapeutic activity, 97112- Neuromuscular re-education, 97535- Self Care, [id number]- Manual therapy, [id number]- Gait training, [phone]- Aquatic Therapy, [phone]- Electrical stimulation (unattended), [phone]- Traction (mechanical), [id number]- Ionotophoresis 4mg /ml Dexamethasone , Patient/Family education, Balance training, Stair training, Dry Needling, Joint mobilization, and Spinal mobilization.  PLAN FOR NEXT SESSION: Posture, trunk and glute strengthening, manual techniques, modalities PRN  Thank you for your referral.  Marciano Mundt, PT, DPT 06/07/2024, 12:17 PM

## 2024-06-09 ENCOUNTER — Ambulatory Visit

## 2024-06-09 DIAGNOSIS — M5459 Other low back pain: Secondary | ICD-10-CM

## 2024-06-09 DIAGNOSIS — M5416 Radiculopathy, lumbar region: Secondary | ICD-10-CM

## 2024-06-09 DIAGNOSIS — R262 Difficulty in walking, not elsewhere classified: Secondary | ICD-10-CM

## 2024-06-09 NOTE — Therapy (Signed)
 OUTPATIENT PHYSICAL THERAPY TREATMENT   Patient Name: PATICIA MOSTER MRN: [id number] DOB:21-May-1956, 68 y.o., female Today's Date: 06/09/2024  END OF SESSION:  PT End of Session - 06/09/24 1036     Visit Number 11    Number of Visits 17    Date for PT Re-Evaluation 06/18/24    PT Start Time 1036    PT Stop Time 1114    PT Time Calculation (min) 38 min    Activity Tolerance Patient tolerated treatment well    Behavior During Therapy [facility] for tasks assessed/performed                    Past Medical History:  Diagnosis Date   Anxiety    Arthritis    hand   Complication of anesthesia 03/18/2022   Hypoxia in pacu. See pacu note 03/18/2022. The hospitalist was consulted for further work-up.   Depression    Diabetes mellitus without complication (HCC)    Dyspnea    DOE   Hyperlipemia    Hypertension    Neuropathy    Sleep apnea    Past Surgical History:  Procedure Laterality Date   ABDOMINAL HYSTERECTOMY  1995   BIOPSY  07/10/2023   Procedure: BIOPSY;  Surgeon: Onita Elspeth Sharper, DO;  Location: [facility] ENDOSCOPY;  Service: Gastroenterology;;   CATARACT EXTRACTION W/PHACO Left 09/25/2017   Procedure: CATARACT EXTRACTION PHACO AND INTRAOCULAR LENS PLACEMENT (IOC);  Surgeon: Myrna Adine Anes, MD;  Location: [facility] ORS;  Service: Ophthalmology;  Laterality: Left;   flui pack lot # [id number]  US  00:32.7 AP%   12.17 CDE   3.95   CATARACT EXTRACTION W/PHACO Right 10/16/2017   Procedure: CATARACT EXTRACTION PHACO AND INTRAOCULAR LENS PLACEMENT (IOC);  Surgeon: Myrna Adine Anes, MD;  Location: [facility] ORS;  Service: Ophthalmology;  Laterality: Right;  US  00:32.8 AP% 9.0 CDE 2.96 FLUID PACK LOT # [id number]   COLONOSCOPY WITH PROPOFOL  N/A 05/27/2016   Procedure: COLONOSCOPY WITH PROPOFOL ;  Surgeon: Lamar ONEIDA Holmes, MD;  Location: [facility] ENDOSCOPY;  Service: Endoscopy;  Laterality: N/A;   COLONOSCOPY WITH PROPOFOL  N/A 07/10/2021   Procedure: COLONOSCOPY WITH PROPOFOL ;   Surgeon: Maryruth Ole ONEIDA, MD;  Location: [facility] ENDOSCOPY;  Service: Endoscopy;  Laterality: N/A;   COLONOSCOPY WITH PROPOFOL  N/A 10/30/2021   Procedure: COLONOSCOPY WITH PROPOFOL ;  Surgeon: Maryruth Ole ONEIDA, MD;  Location: [facility] ENDOSCOPY;  Service: Endoscopy;  Laterality: N/A;   COLONOSCOPY WITH PROPOFOL  N/A 02/05/2022   Procedure: COLONOSCOPY WITH PROPOFOL ;  Surgeon: Maryruth Ole ONEIDA, MD;  Location: [facility] ENDOSCOPY;  Service: Endoscopy;  Laterality: N/A;   COLONOSCOPY WITH PROPOFOL  N/A 07/10/2023   Procedure: COLONOSCOPY WITH PROPOFOL ;  Surgeon: Onita Elspeth Sharper, DO;  Location: [facility] ENDOSCOPY;  Service: Gastroenterology;  Laterality: N/A;   ESOPHAGOGASTRODUODENOSCOPY (EGD) WITH PROPOFOL  N/A 07/10/2021   Procedure: ESOPHAGOGASTRODUODENOSCOPY (EGD) WITH PROPOFOL ;  Surgeon: Maryruth Ole ONEIDA, MD;  Location: [facility] ENDOSCOPY;  Service: Endoscopy;  Laterality: N/A;  IDDM   ESOPHAGOGASTRODUODENOSCOPY (EGD) WITH PROPOFOL  N/A 10/30/2021   Procedure: ESOPHAGOGASTRODUODENOSCOPY (EGD) WITH PROPOFOL ;  Surgeon: Maryruth Ole ONEIDA, MD;  Location: [facility] ENDOSCOPY;  Service: Endoscopy;  Laterality: N/A;  IDDM   EYE SURGERY Left 09/25/2017   cataract extraction   HYSTERECTOMY SUPRACERVICAL ABDOMINAL W/REMOVAL TUBES &/OR OVARIES  1995   KENALOG  INJECTION Right 10/16/2017   Procedure: KENALOG  INJECTION;  Surgeon: Myrna Adine Anes, MD;  Location: [facility] ORS;  Service: Ophthalmology;  Laterality: Right;   KNEE ARTHROCENTESIS Left 1981   KNEE ARTHROCENTESIS Bilateral 1992   right  one in 1992   KNEE ARTHROCENTESIS Bilateral    POLYPECTOMY  07/10/2023   Procedure: POLYPECTOMY;  Surgeon: Onita Elspeth Sharper, DO;  Location: [facility] ENDOSCOPY;  Service: Gastroenterology;;   XI ROBOTIC LAPAROSCOPIC ASSISTED APPENDECTOMY N/A 03/18/2022   Procedure: XI ROBOTIC LAPAROSCOPIC ASSISTED APPENDECTOMY;  Surgeon: Rodolph Romano, MD;  Location: [facility] ORS;  Service: General;  Laterality: N/A;   Patient Active  Problem List   Diagnosis Date Noted   Hypoxia 03/18/2022   Hypertension    Diabetes mellitus without complication (HCC)    Depression    Anemia    History of laparoscopic appendectomy     PCP: Autry Grayce LABOR, PA   REFERRING PROVIDER: Rocco Organ Dipakkumar, MD  REFERRING DIAG: [phone] (ICD-10-CM) - Other intervertebral disc degeneration, lumbar region without mention of lumbar back pain or lower extremity pain  Rationale for Evaluation and Treatment: Rehabilitation  THERAPY DIAG:  Other low back pain  Radiculopathy, lumbar region  Difficulty in walking, not elsewhere classified  ONSET DATE: December 2024  SUBJECTIVE:                                                                                                                                                                                           SUBJECTIVE STATEMENT: Back and legs hurt a lot last night and could not sleep. 6/10 low back and R LE pain currently. Did the standing hip flexor stretch yesterday which might have flared up her symptoms. That was the only exercise she did yesterday.       PERTINENT HISTORY:  Low back pain. Currently wearing a back brace which is not helping. Pt states having a bulging disc. Pain started December 2024, sudden onset, unknown mechanism of injury. Pt states having R Sciatic nerve in R posterior hip pain initially. Had x-ryas done, then and MRI which revealed bulging disc. Pain radiates along the R L5 dermatome as well as sciatic nerve (to superficial peroneal nerve distribution) to dorsal foot. R anterior ankle hurts. Pain has worsened since January 2025. Surgeon wants pt to do PT.      No latex allergies   Had L eye surgery 03/17/2024, pt states she is clear to participate in PT. 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  Blood pressure is controlled per pt.  DM is controlled per pt.  Has a CPAP machine.     PAIN:  Are you having pain? Yes: NPRS scale: 8/10 Pain location:  Low back and R L5 dermatome and R sciatic nerve distribution to R dorsal foot.  Pain description: Low back: pressure; R LE: ache, tight Aggravating factors: Sleeping on her R side,  standing and walking Relieving factors: leaning to the L, sleeping on her L side, leaning forward, wearing her back brace  PRECAUTIONS: Cannot see with L eye.     RED FLAGS: Bowel or bladder incontinence: No and Cauda equina syndrome: No   WEIGHT BEARING RESTRICTIONS: No  FALLS:  Has patient fallen in last 6 months? No  LIVING ENVIRONMENT: Lives with: lives with their spouse Lives in: House/apartment Stairs: Yes: Internal: 2 steps; none and External: 0 steps; none Has following equipment at home: Single point cane  OCCUPATION: Housewife  PLOF: Independent  PATIENT GOALS: decrease pain  NEXT MD VISIT: none at the moment  OBJECTIVE:  Note: Objective measures were completed at Evaluation unless otherwise noted.  DIAGNOSTIC FINDINGS:    PATIENT SURVEYS:  Modified Oswestry Low Back Pain Disability Questionnaire 62% (04/22/2024)  COGNITION: Overall cognitive status: Within functional limits for tasks assessed     SENSATION:   MUSCLE LENGTH:   POSTURE: forward neck, B protracted shoulders, L trunk side bend, movement preference around L4/L5 area, L weight shifting.   PALPATION: TTP R low back, increased B lumbar paraspinal muscle tension R > L  TTP R L5 > L4 > L3 TTP R low back > L side     LUMBAR ROM:   AROM eval  Flexion WFL, R posterior hip pain when returning to the upright position  Extension Very limited with R posterior hip pain  Right lateral flexion Limited with R lateral hip joint pain (worse than L lateral flexion)  Left lateral flexion Limited with R lateral hip pain  Right rotation [facility] with R posterior lateral hip pain worse than L rotation  Left rotation WFL with R posterior lateral hip pain.    (Blank rows = not tested)  LOWER EXTREMITY ROM:     Passive   Right 05/31/2024 Left 05/31/2024  Hip flexion    Hip extension    Hip abduction    Hip adduction    Hip internal rotation 37 18  Hip external rotation 37 with R low back pain (different pain) 34  Knee flexion    Knee extension    Ankle dorsiflexion    Ankle plantarflexion    Ankle inversion    Ankle eversion     (Blank rows = not tested)  LOWER EXTREMITY MMT:    MMT Right eval Left eval  Hip flexion    Hip extension (seated manually resisted) 3+ 3+  Hip abduction (seated manually resisted) 4- 4-  Hip adduction    Hip internal rotation    Hip external rotation    Knee flexion 4 4  Knee extension 4+ 4+  Ankle dorsiflexion    Ankle plantarflexion    Ankle inversion    Ankle eversion     (Blank rows = not tested)  LUMBAR SPECIAL TESTS:    FUNCTIONAL TESTS:    GAIT: Distance walked: 30 ft Assistive device utilized: None Level of assistance: SBA Comments: Decreased stance R LE, antalgic with R trunk side bend during R LE stance phase, decreased B knee extension during stance phase, decreased foot clearance and gait velocity.   TREATMENT DATE: 06/09/2024  Neuromuscular Re-education   Standing hip flexor stretch onto stair step with B UE assist   Discontinued   Sitting with lumbar towel roll 50 seconds  Increased low back and R LE symptoms.   Hooklying   Crunch 5x5  Supine L hip IR stretch with PT 30 seconds, then 1 min x 2   R posterior hip symptoms.   Supine R hip IR stretch with PT R L5 dermatome symptoms  Supine R SKTC: R posterior thigh symptoms  Hooklying  hip adduction isometrics 10x5 seconds for 3 sets    Hip extension isometrics leg straight with transversus abdominis activation   R 10x5 seconds for 4 sets    Decreased low back and R LE pain      Improved exercise technique, movement at target joints, use of target  muscles after mod verbal, visual, tactile cues.     PATIENT EDUCATION:  Education details: there-ex, HEP, POC Person educated: Patient Education method: Explanation, Demonstration, Tactile cues, Verbal cues, and Handouts Education comprehension: verbalized understanding and returned demonstration  HOME EXERCISE PROGRAM: Access Code: [email or web address] URL: [email or web address] Date: 04/22/2024 Prepared by: Emil Glassman  Exercises - Seated Transversus Abdominis Bracing  - 3 x daily - 7 x weekly - 3 sets - 10 reps - 5 seconds hold - Seated Gluteal Sets  - 3 x daily - 7 x weekly - 3 sets - 10 reps - 5 seconds hold   Reclined, hooklying hip extension isometrics with leg straight  R 10x5 seconds for 3 sets daily.  Drawing provided.      - Supine Transversus Abdominis Bracing - Hands on Stomach  - 5 x daily - 7 x weekly - 3 sets - 10 reps - 5 seconds hold - Supine Posterior Pelvic Tilt  - 2 x daily - 7 x weekly - 3 sets - 5 reps - 15 seconds hold  - Bent Knee Fallouts with Alternating Legs  - 1 x daily - 7 x weekly - 3 sets - 5 reps  - Supine Piriformis Stretch with Towel  - 3 x daily - 7 x weekly - 1 sets - 5 reps - 30 seconds hold   Sitting with lumbar towel roll 2 min   ASSESSMENT:  CLINICAL IMPRESSION:  Worked on core strengthening, L hip mobility and R glute strength. Fair tolerance to today's session. Symptoms seem to ease off in supine with R hip extension isometrics exercise.  Pt will benefit from continued skilled physical therapy services to decrease pain, improve strength and function.       OBJECTIVE IMPAIRMENTS: Abnormal gait, decreased balance, difficulty walking, decreased strength, improper body mechanics, postural dysfunction, and pain.   ACTIVITY LIMITATIONS: carrying, lifting, bending, sitting, standing, squatting, sleeping, stairs, transfers, bed mobility, dressing, hygiene/grooming, and locomotion level  PARTICIPATION LIMITATIONS:   PERSONAL  FACTORS: Age, Fitness, Time since onset of injury/illness/exacerbation, and 3+ comorbidities: anxiety, arthritis, depression, DM, HTN, sleep apnea are also affecting patient's functional outcome.   REHAB POTENTIAL: Fair    CLINICAL DECISION MAKING: Evolving/moderate complexity Pain has worsened since onset per pt.   EVALUATION COMPLEXITY: Moderate   GOALS: Goals reviewed with patient? Yes  SHORT TERM GOALS: Target date: 05/07/2024  Pt will be independent with her initial HEP to decrease pain, improve strength, function, and ability to perform standing tasks more comfortably.  Baseline: Pt has started her initial HEP (04/22/2024); doing her HEP, no questions (06/02/2024) Goal status: MET   LONG TERM GOALS: Target date: 06/18/2024  Pt will have  a decrease in low back pain to 4/10 or less at worst to promote ability to perform standing tasks more comfortably.  Baseline:8/10 low back pain at worst for the past 3 months (04/22/2024); 5/10 back pain at most for the past 7 days, also not currently wearing her back brace (06/02/2024), 5/10 at worst but when she vacuums, back pain goes up to a 6/10. Currently no longer using her back brace (06/07/2024) Goal status: PROGRESSING  2.  Pt will have a decrease in R LE pain to 4/10 or less at worst to promote ability to ambulate and perform standing tasks more comfortably.  Baseline: 9/10 R LE pain at worst for the past 3 months. (04/22/2024); 5/10 R LE pain at most for the past 7 days, does not hurt often like it used to (06/02/2024), 5/10 at most but when she vacuums, R LE pain goes up to a 6/10. Currently no longer using her back brace (06/07/2024) Goal status: PROGRESSING  3.  Pt will improve her Modified Oswestry Low Back Pain Disability Questionnaire by at least 20% as a demonstration of improved function.  Baseline: Modified Oswestry Low Back Pain Disability Questionnaire 62% (04/22/2024); 46 % (06/07/2024) Goal status: PROGRESSING  4.  Pt will improve her B  hip extension and abduction strength by at least 1/2 MMT grade to promote ability to ambulate and perform standing tasks more comfortably.  Baseline:  MMT Right eval Left eval R (06/07/2024) L (06/07/2024)  Hip extension (seated manually resisted) 3+ 3+ 4+ 4  Hip abduction (seated manually resisted) 4- 4- 4+ 4+   (04/22/2024)  Goal status: MET   PLAN:  PT FREQUENCY: 1-2x/week  PT DURATION: 8 weeks  PLANNED INTERVENTIONS: 97110-Therapeutic exercises, 97530- Therapeutic activity, 97112- Neuromuscular re-education, 97535- Self Care, [id number]- Manual therapy, [id number]- Gait training, [phone]- Aquatic Therapy, [phone]- Electrical stimulation (unattended), [phone]- Traction (mechanical), [id number]- Ionotophoresis 4mg /ml Dexamethasone , Patient/Family education, Balance training, Stair training, Dry Needling, Joint mobilization, and Spinal mobilization.  PLAN FOR NEXT SESSION: Posture, trunk and glute strengthening, manual techniques, modalities PRN    Ramia Sidney, PT, DPT 06/09/2024, 12:52 PM

## 2024-06-15 ENCOUNTER — Ambulatory Visit: Attending: Neurological Surgery

## 2024-06-15 DIAGNOSIS — M5459 Other low back pain: Secondary | ICD-10-CM | POA: Insufficient documentation

## 2024-06-15 DIAGNOSIS — M5416 Radiculopathy, lumbar region: Secondary | ICD-10-CM | POA: Insufficient documentation

## 2024-06-15 DIAGNOSIS — R262 Difficulty in walking, not elsewhere classified: Secondary | ICD-10-CM | POA: Insufficient documentation

## 2024-06-15 NOTE — Therapy (Signed)
 OUTPATIENT PHYSICAL THERAPY TREATMENT   Patient Name: Emma White MRN: [id number] DOB:[birth year], 68 y.o., female Today's Date: 06/15/2024  END OF SESSION:  PT End of Session - 06/15/24 1118     Visit Number 12    Number of Visits 25    Date for PT Re-Evaluation 07/16/24    PT Start Time 1119    PT Stop Time 1158    PT Time Calculation (min) 39 min    Activity Tolerance Patient tolerated treatment well    Behavior During Therapy [facility] for tasks assessed/performed                     Past Medical History:  Diagnosis Date   Anxiety    Arthritis    hand   Complication of anesthesia 03/18/2022   Hypoxia in pacu. See pacu note 03/18/2022. The hospitalist was consulted for further work-up.   Depression    Diabetes mellitus without complication (HCC)    Dyspnea    DOE   Hyperlipemia    Hypertension    Neuropathy    Sleep apnea    Past Surgical History:  Procedure Laterality Date   ABDOMINAL HYSTERECTOMY  1995   BIOPSY  07/10/2023   Procedure: BIOPSY;  Surgeon: Onita Elspeth Sharper, DO;  Location: [facility] ENDOSCOPY;  Service: Gastroenterology;;   CATARACT EXTRACTION W/PHACO Left 09/25/2017   Procedure: CATARACT EXTRACTION PHACO AND INTRAOCULAR LENS PLACEMENT (IOC);  Surgeon: Myrna Adine Anes, MD;  Location: [facility] ORS;  Service: Ophthalmology;  Laterality: Left;   flui pack lot # [id number]  US  00:32.7 AP%   12.17 CDE   3.95   CATARACT EXTRACTION W/PHACO Right 10/16/2017   Procedure: CATARACT EXTRACTION PHACO AND INTRAOCULAR LENS PLACEMENT (IOC);  Surgeon: Myrna Adine Anes, MD;  Location: [facility] ORS;  Service: Ophthalmology;  Laterality: Right;  US  00:32.8 AP% 9.0 CDE 2.96 FLUID PACK LOT # [id number]   COLONOSCOPY WITH PROPOFOL  N/A 05/27/2016   Procedure: COLONOSCOPY WITH PROPOFOL ;  Surgeon: Lamar ONEIDA Holmes, MD;  Location: [facility] ENDOSCOPY;  Service: Endoscopy;  Laterality: N/A;   COLONOSCOPY WITH PROPOFOL  N/A 07/10/2021   Procedure: COLONOSCOPY WITH PROPOFOL ;   Surgeon: Maryruth Ole ONEIDA, MD;  Location: [facility] ENDOSCOPY;  Service: Endoscopy;  Laterality: N/A;   COLONOSCOPY WITH PROPOFOL  N/A 10/30/2021   Procedure: COLONOSCOPY WITH PROPOFOL ;  Surgeon: Maryruth Ole ONEIDA, MD;  Location: [facility] ENDOSCOPY;  Service: Endoscopy;  Laterality: N/A;   COLONOSCOPY WITH PROPOFOL  N/A 02/05/2022   Procedure: COLONOSCOPY WITH PROPOFOL ;  Surgeon: Maryruth Ole ONEIDA, MD;  Location: [facility] ENDOSCOPY;  Service: Endoscopy;  Laterality: N/A;   COLONOSCOPY WITH PROPOFOL  N/A 07/10/2023   Procedure: COLONOSCOPY WITH PROPOFOL ;  Surgeon: Onita Elspeth Sharper, DO;  Location: [facility] ENDOSCOPY;  Service: Gastroenterology;  Laterality: N/A;   ESOPHAGOGASTRODUODENOSCOPY (EGD) WITH PROPOFOL  N/A 07/10/2021   Procedure: ESOPHAGOGASTRODUODENOSCOPY (EGD) WITH PROPOFOL ;  Surgeon: Maryruth Ole ONEIDA, MD;  Location: [facility] ENDOSCOPY;  Service: Endoscopy;  Laterality: N/A;  IDDM   ESOPHAGOGASTRODUODENOSCOPY (EGD) WITH PROPOFOL  N/A 10/30/2021   Procedure: ESOPHAGOGASTRODUODENOSCOPY (EGD) WITH PROPOFOL ;  Surgeon: Maryruth Ole ONEIDA, MD;  Location: [facility] ENDOSCOPY;  Service: Endoscopy;  Laterality: N/A;  IDDM   EYE SURGERY Left 09/25/2017   cataract extraction   HYSTERECTOMY SUPRACERVICAL ABDOMINAL W/REMOVAL TUBES &/OR OVARIES  1995   KENALOG  INJECTION Right 10/16/2017   Procedure: KENALOG  INJECTION;  Surgeon: Myrna Adine Anes, MD;  Location: [facility] ORS;  Service: Ophthalmology;  Laterality: Right;   KNEE ARTHROCENTESIS Left 1981   KNEE ARTHROCENTESIS Bilateral 1992  right one in 1992   KNEE ARTHROCENTESIS Bilateral    POLYPECTOMY  07/10/2023   Procedure: POLYPECTOMY;  Surgeon: Onita Elspeth Sharper, DO;  Location: [facility] ENDOSCOPY;  Service: Gastroenterology;;   XI ROBOTIC LAPAROSCOPIC ASSISTED APPENDECTOMY N/A 03/18/2022   Procedure: XI ROBOTIC LAPAROSCOPIC ASSISTED APPENDECTOMY;  Surgeon: Rodolph Romano, MD;  Location: [facility] ORS;  Service: General;  Laterality: N/A;   Patient Active  Problem List   Diagnosis Date Noted   Hypoxia 03/18/2022   Hypertension    Diabetes mellitus without complication (HCC)    Depression    Anemia    History of laparoscopic appendectomy     PCP: Autry Grayce LABOR, PA   REFERRING PROVIDER: Rocco Organ Dipakkumar, MD  REFERRING DIAG: [phone] (ICD-10-CM) - Other intervertebral disc degeneration, lumbar region without mention of lumbar back pain or lower extremity pain  Rationale for Evaluation and Treatment: Rehabilitation  THERAPY DIAG:  Other low back pain - Plan: PT plan of care cert/re-cert  Radiculopathy, lumbar region - Plan: PT plan of care cert/re-cert  Difficulty in walking, not elsewhere classified - Plan: PT plan of care cert/re-cert  ONSET DATE: December 2024  SUBJECTIVE:                                                                                                                                                                                           SUBJECTIVE STATEMENT: Walked this past Saturday [place] her [place] all day. Was hurting in her low back and R leg on Sunday. 6/10 low back, and R LE (L5 dermatome) currently. Going to the pain clinic next Wednesday. Tries not to wear her back brace.         PERTINENT HISTORY:  Low back pain. Currently wearing a back brace which is not helping. Pt states having a bulging disc. Pain started December 2024, sudden onset, unknown mechanism of injury. Pt states having R Sciatic nerve in R posterior hip pain initially. Had x-ryas done, then and MRI which revealed bulging disc. Pain radiates along the R L5 dermatome as well as sciatic nerve (to superficial peroneal nerve distribution) to dorsal foot. R anterior ankle hurts. Pain has worsened since January 2025. Surgeon wants pt to do PT.      No latex allergies   Had L eye surgery 03/17/2024, pt states she is clear to participate in PT. 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  Blood pressure is controlled per pt.  DM  is controlled per pt.  Has a CPAP machine.     PAIN:  Are you having pain? Yes: NPRS scale: 8/10 Pain location: Low back and R  L5 dermatome and R sciatic nerve distribution to R dorsal foot.  Pain description: Low back: pressure; R LE: ache, tight Aggravating factors: Sleeping on her R side, standing and walking Relieving factors: leaning to the L, sleeping on her L side, leaning forward, wearing her back brace  PRECAUTIONS: Cannot see with L eye.     RED FLAGS: Bowel or bladder incontinence: No and Cauda equina syndrome: No   WEIGHT BEARING RESTRICTIONS: No  FALLS:  Has patient fallen in last 6 months? No  LIVING ENVIRONMENT: Lives with: lives with their spouse Lives in: House/apartment Stairs: Yes: Internal: 2 steps; none and External: 0 steps; none Has following equipment at home: Single point cane  OCCUPATION: Housewife  PLOF: Independent  PATIENT GOALS: decrease pain  NEXT MD VISIT: none at the moment  OBJECTIVE:  Note: Objective measures were completed at Evaluation unless otherwise noted.  DIAGNOSTIC FINDINGS:    PATIENT SURVEYS:  Modified Oswestry Low Back Pain Disability Questionnaire 62% (04/22/2024)  COGNITION: Overall cognitive status: Within functional limits for tasks assessed     SENSATION:   MUSCLE LENGTH:   POSTURE: forward neck, B protracted shoulders, L trunk side bend, movement preference around L4/L5 area, L weight shifting.   PALPATION: TTP R low back, increased B lumbar paraspinal muscle tension R > L  TTP R L5 > L4 > L3 TTP R low back > L side     LUMBAR ROM:   AROM eval  Flexion WFL, R posterior hip pain when returning to the upright position  Extension Very limited with R posterior hip pain  Right lateral flexion Limited with R lateral hip joint pain (worse than L lateral flexion)  Left lateral flexion Limited with R lateral hip pain  Right rotation [facility] with R posterior lateral hip pain worse than L rotation  Left  rotation WFL with R posterior lateral hip pain.    (Blank rows = not tested)  LOWER EXTREMITY ROM:     Passive  Right 05/31/2024 Left 05/31/2024  Hip flexion    Hip extension    Hip abduction    Hip adduction    Hip internal rotation 37 18  Hip external rotation 37 with R low back pain (different pain) 34  Knee flexion    Knee extension    Ankle dorsiflexion    Ankle plantarflexion    Ankle inversion    Ankle eversion     (Blank rows = not tested)  LOWER EXTREMITY MMT:    MMT Right eval Left eval  Hip flexion    Hip extension (seated manually resisted) 3+ 3+  Hip abduction (seated manually resisted) 4- 4-  Hip adduction    Hip internal rotation    Hip external rotation    Knee flexion 4 4  Knee extension 4+ 4+  Ankle dorsiflexion    Ankle plantarflexion    Ankle inversion    Ankle eversion     (Blank rows = not tested)  LUMBAR SPECIAL TESTS:    FUNCTIONAL TESTS:    GAIT: Distance walked: 30 ft Assistive device utilized: None Level of assistance: SBA Comments: Decreased stance R LE, antalgic with R trunk side bend during R LE stance phase, decreased B knee extension during stance phase, decreased foot clearance and gait velocity.   TREATMENT DATE: 06/15/2024  Neuromuscular Re-education    Seated hip adduction isometric folded pillow squeeze 10x3 with 5 second holds             Slight decrease in R LE symptoms.   Seated trunk flexion position: decreased low back and R LE pain   Seated trunk flexion stretch 30 seconds x 3   Decreased R LE symptoms  Standing with B UE assist   With slight forward trunk flexion position   Hip abduction     R 10x3    L 10x3  .   Difficulty with trunk stability observed.    B glute set with transversus abdominis contraction 10x10 seconds for 2 sets    Improved back comfort level reported    Sitting with upright posture   Transversus abdominis contraction with B shoulder extension isometrics, hands on thighs. 10x5 seconds for 2 sets    Improved exercise technique, movement at target joints, use of target muscles after mod verbal, visual, tactile cues.     PATIENT EDUCATION:  Education details: there-ex, HEP, POC Person educated: Patient Education method: Explanation, Demonstration, Tactile cues, Verbal cues, and Handouts Education comprehension: verbalized understanding and returned demonstration  HOME EXERCISE PROGRAM: Access Code: [email or web address] URL: [email or web address] Date: 04/22/2024 Prepared by: Emil Glassman  Exercises - Seated Transversus Abdominis Bracing  - 3 x daily - 7 x weekly - 3 sets - 10 reps - 5 seconds hold  Upgraded to standing on (06/15/2024) - Seated Gluteal Sets  - 3 x daily - 7 x weekly - 3 sets - 10 reps - 5 seconds hold  Upgraded to standing on (06/15/2024)  Reclined, hooklying hip extension isometrics with leg straight  R 10x5 seconds for 3 sets daily.  Drawing provided.      - Supine Transversus Abdominis Bracing - Hands on Stomach  - 5 x daily - 7 x weekly - 3 sets - 10 reps - 5 seconds hold - Supine Posterior Pelvic Tilt  - 2 x daily - 7 x weekly - 3 sets - 5 reps - 15 seconds hold  - Bent Knee Fallouts with Alternating Legs  - 1 x daily - 7 x weekly - 3 sets - 5 reps  - Supine Piriformis Stretch with Towel  - 3 x daily - 7 x weekly - 1 sets - 5 reps - 30 seconds hold   Sitting with lumbar towel roll 2 min  - Seated Flexion Stretch  - 3 x daily - 7 x weekly - 1 sets - 5 reps - 30 seconds hold  - Standing Gluteal Sets  - 1 x daily - 7 x weekly - 3 sets - 10 reps - 10 seconds hold - Standing Transverse Abdominis Contraction  - 1 x daily - 7 x weekly - 3 sets - 10 reps - 10 seconds hold  ASSESSMENT:  CLINICAL IMPRESSION:  Pt demonstrates overall decreased low back and R LE pain, improved B hip strength and function  since initial evaluation. Pt making progress with PT towards goals. Continued working on improving trunk and glute strength to promote lumbopelvic stability when ambulating and performing standing tasks. Decreased low back pain reported after session. Pt will benefit from continued skilled physical therapy services to decrease pain, improve strength and function.     OBJECTIVE IMPAIRMENTS: Abnormal gait, decreased balance, difficulty walking, decreased strength, improper body mechanics, postural dysfunction, and pain.   ACTIVITY LIMITATIONS: carrying, lifting, bending, sitting, standing, squatting, sleeping, stairs, transfers, bed mobility, dressing, hygiene/grooming, and locomotion level  PARTICIPATION LIMITATIONS:   PERSONAL FACTORS: Age, Fitness, Time since onset of injury/illness/exacerbation, and 3+ comorbidities: anxiety, arthritis, depression, DM, HTN, sleep apnea are also affecting patient's functional outcome.   REHAB POTENTIAL: Fair    CLINICAL DECISION MAKING: Stable/uncomplicated Pain has worsened since onset per pt.   EVALUATION COMPLEXITY: Low   GOALS: Goals reviewed with patient? Yes  SHORT TERM GOALS: Target date: 05/07/2024  Pt will be independent with her initial HEP to decrease pain, improve strength, function, and ability to perform standing tasks more comfortably.  Baseline: Pt has started her initial HEP (04/22/2024); doing her HEP, no questions (06/02/2024) Goal status: MET   LONG TERM GOALS: Target date: 07/16/2024  Pt will have a decrease in low back pain to 4/10 or less at worst to promote ability to perform standing tasks more comfortably.  Baseline:8/10 low back pain at worst for the past 3 months (04/22/2024); 5/10 back pain at most for the past 7 days, also not currently wearing her back brace (06/02/2024), 5/10 at worst but when she vacuums, back pain goes up to a 6/10. Currently no longer using her back brace (06/07/2024); 6/10 at worst for the past 7 days  (06/15/2024) Goal status: PROGRESSING  2.  Pt will have a decrease in R LE pain to 4/10 or less at worst to promote ability to ambulate and perform standing tasks more comfortably.  Baseline: 9/10 R LE pain at worst for the past 3 months. (04/22/2024); 5/10 R LE pain at most for the past 7 days, does not hurt often like it used to (06/02/2024), 5/10 at most but when she vacuums, R LE pain goes up to a 6/10. Currently no longer using her back brace (06/07/2024) Goal status: PROGRESSING  3.  Pt will improve her Modified Oswestry Low Back Pain Disability Questionnaire by at least 20% as a demonstration of improved function.  Baseline: Modified Oswestry Low Back Pain Disability Questionnaire 62% (04/22/2024); 46 % (06/07/2024) Goal status: PROGRESSING  4.  Pt will improve her B hip extension and abduction strength by at least 1/2 MMT grade to promote ability to ambulate and perform standing tasks more comfortably.  Baseline:  MMT Right eval Left eval R (06/07/2024) L (06/07/2024)  Hip extension (seated manually resisted) 3+ 3+ 4+ 4  Hip abduction (seated manually resisted) 4- 4- 4+ 4+   (04/22/2024)  Goal status: MET   PLAN:  PT FREQUENCY: 1-2x/week  PT DURATION: 4 weeks  PLANNED INTERVENTIONS: 97110-Therapeutic exercises, 97530- Therapeutic activity, 97112- Neuromuscular re-education, 97535- Self Care, 02859- Manual therapy, [id number]- Gait training, [id number]- Aquatic Therapy, [id number]- Electrical stimulation (unattended), [id number]- Traction (mechanical), [id number]- Ionotophoresis 4mg /ml Dexamethasone , Patient/Family education, Balance training, Stair training, Dry Needling, Joint mobilization, and Spinal mobilization.  PLAN FOR NEXT SESSION: Posture, trunk and glute strengthening, manual techniques, modalities PRN    Lama Narayanan, PT, DPT 06/15/2024, 12:25 PM

## 2024-06-17 ENCOUNTER — Ambulatory Visit

## 2024-06-17 DIAGNOSIS — R262 Difficulty in walking, not elsewhere classified: Secondary | ICD-10-CM

## 2024-06-17 DIAGNOSIS — M5416 Radiculopathy, lumbar region: Secondary | ICD-10-CM

## 2024-06-17 DIAGNOSIS — M5459 Other low back pain: Secondary | ICD-10-CM

## 2024-06-17 NOTE — Therapy (Signed)
 OUTPATIENT PHYSICAL THERAPY TREATMENT   Patient Name: RAGUEL KOSLOSKI MRN: [id number] DOB:Jan 12, 1956, 68 y.o., female Today's Date: 06/17/2024  END OF SESSION:  PT End of Session - 06/17/24 1122     Visit Number 13    Number of Visits 25    Date for PT Re-Evaluation 07/16/24    PT Start Time 1122    PT Stop Time 1205    PT Time Calculation (min) 43 min    Activity Tolerance Patient tolerated treatment well    Behavior During Therapy [facility] for tasks assessed/performed                      Past Medical History:  Diagnosis Date   Anxiety    Arthritis    hand   Complication of anesthesia 03/18/2022   Hypoxia in pacu. See pacu note 03/18/2022. The hospitalist was consulted for further work-up.   Depression    Diabetes mellitus without complication (HCC)    Dyspnea    DOE   Hyperlipemia    Hypertension    Neuropathy    Sleep apnea    Past Surgical History:  Procedure Laterality Date   ABDOMINAL HYSTERECTOMY  1995   BIOPSY  07/10/2023   Procedure: BIOPSY;  Surgeon: Onita Elspeth Sharper, DO;  Location: [facility] ENDOSCOPY;  Service: Gastroenterology;;   CATARACT EXTRACTION W/PHACO Left 09/25/2017   Procedure: CATARACT EXTRACTION PHACO AND INTRAOCULAR LENS PLACEMENT (IOC);  Surgeon: Myrna Adine Anes, MD;  Location: [facility] ORS;  Service: Ophthalmology;  Laterality: Left;   flui pack lot # [id number]  US  00:32.7 AP%   12.17 CDE   3.95   CATARACT EXTRACTION W/PHACO Right 10/16/2017   Procedure: CATARACT EXTRACTION PHACO AND INTRAOCULAR LENS PLACEMENT (IOC);  Surgeon: Myrna Adine Anes, MD;  Location: [facility] ORS;  Service: Ophthalmology;  Laterality: Right;  US  00:32.8 AP% 9.0 CDE 2.96 FLUID PACK LOT # [id number]   COLONOSCOPY WITH PROPOFOL  N/A 05/27/2016   Procedure: COLONOSCOPY WITH PROPOFOL ;  Surgeon: Lamar ONEIDA Holmes, MD;  Location: [facility] ENDOSCOPY;  Service: Endoscopy;  Laterality: N/A;   COLONOSCOPY WITH PROPOFOL  N/A 07/10/2021   Procedure: COLONOSCOPY WITH PROPOFOL ;   Surgeon: Maryruth Ole ONEIDA, MD;  Location: [facility] ENDOSCOPY;  Service: Endoscopy;  Laterality: N/A;   COLONOSCOPY WITH PROPOFOL  N/A 10/30/2021   Procedure: COLONOSCOPY WITH PROPOFOL ;  Surgeon: Maryruth Ole ONEIDA, MD;  Location: [facility] ENDOSCOPY;  Service: Endoscopy;  Laterality: N/A;   COLONOSCOPY WITH PROPOFOL  N/A 02/05/2022   Procedure: COLONOSCOPY WITH PROPOFOL ;  Surgeon: Maryruth Ole ONEIDA, MD;  Location: [facility] ENDOSCOPY;  Service: Endoscopy;  Laterality: N/A;   COLONOSCOPY WITH PROPOFOL  N/A 07/10/2023   Procedure: COLONOSCOPY WITH PROPOFOL ;  Surgeon: Onita Elspeth Sharper, DO;  Location: [facility] ENDOSCOPY;  Service: Gastroenterology;  Laterality: N/A;   ESOPHAGOGASTRODUODENOSCOPY (EGD) WITH PROPOFOL  N/A 07/10/2021   Procedure: ESOPHAGOGASTRODUODENOSCOPY (EGD) WITH PROPOFOL ;  Surgeon: Maryruth Ole ONEIDA, MD;  Location: [facility] ENDOSCOPY;  Service: Endoscopy;  Laterality: N/A;  IDDM   ESOPHAGOGASTRODUODENOSCOPY (EGD) WITH PROPOFOL  N/A 10/30/2021   Procedure: ESOPHAGOGASTRODUODENOSCOPY (EGD) WITH PROPOFOL ;  Surgeon: Maryruth Ole ONEIDA, MD;  Location: [facility] ENDOSCOPY;  Service: Endoscopy;  Laterality: N/A;  IDDM   EYE SURGERY Left 09/25/2017   cataract extraction   HYSTERECTOMY SUPRACERVICAL ABDOMINAL W/REMOVAL TUBES &/OR OVARIES  1995   KENALOG  INJECTION Right 10/16/2017   Procedure: KENALOG  INJECTION;  Surgeon: Myrna Adine Anes, MD;  Location: [facility] ORS;  Service: Ophthalmology;  Laterality: Right;   KNEE ARTHROCENTESIS Left 1981   KNEE ARTHROCENTESIS Bilateral 1992  right one in 1992   KNEE ARTHROCENTESIS Bilateral    POLYPECTOMY  07/10/2023   Procedure: POLYPECTOMY;  Surgeon: Onita Elspeth Sharper, DO;  Location: [facility] ENDOSCOPY;  Service: Gastroenterology;;   XI ROBOTIC LAPAROSCOPIC ASSISTED APPENDECTOMY N/A 03/18/2022   Procedure: XI ROBOTIC LAPAROSCOPIC ASSISTED APPENDECTOMY;  Surgeon: Rodolph Romano, MD;  Location: [facility] ORS;  Service: General;  Laterality: N/A;   Patient Active  Problem List   Diagnosis Date Noted   Hypoxia 03/18/2022   Hypertension    Diabetes mellitus without complication (HCC)    Depression    Anemia    History of laparoscopic appendectomy     PCP: Autry Grayce LABOR, PA   REFERRING PROVIDER: Rocco Organ Dipakkumar, MD  REFERRING DIAG: [phone] (ICD-10-CM) - Other intervertebral disc degeneration, lumbar region without mention of lumbar back pain or lower extremity pain  Rationale for Evaluation and Treatment: Rehabilitation  THERAPY DIAG:  Other low back pain  Radiculopathy, lumbar region  Difficulty in walking, not elsewhere classified  ONSET DATE: December 2024  SUBJECTIVE:                                                                                                                                                                                           SUBJECTIVE STATEMENT: Wednesday morning both LE's were sore. Did her exercises which helped ease her symptoms. 5/10 low back and R LE symptoms currently. Its getting better.          PERTINENT HISTORY:  Low back pain. Currently wearing a back brace which is not helping. Pt states having a bulging disc. Pain started December 2024, sudden onset, unknown mechanism of injury. Pt states having R Sciatic nerve in R posterior hip pain initially. Had x-ryas done, then and MRI which revealed bulging disc. Pain radiates along the R L5 dermatome as well as sciatic nerve (to superficial peroneal nerve distribution) to dorsal foot. R anterior ankle hurts. Pain has worsened since January 2025. Surgeon wants pt to do PT.      No latex allergies   Had L eye surgery 03/17/2024, pt states she is clear to participate in PT. 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  Blood pressure is controlled per pt.  DM is controlled per pt.  Has a CPAP machine.     PAIN:  Are you having pain? Yes: NPRS scale: 8/10 Pain location: Low back and R L5 dermatome and R sciatic nerve distribution to R  dorsal foot.  Pain description: Low back: pressure; R LE: ache, tight Aggravating factors: Sleeping on her R side, standing and walking Relieving factors: leaning to the L, sleeping on her L  side, leaning forward, wearing her back brace  PRECAUTIONS: Cannot see with L eye.     RED FLAGS: Bowel or bladder incontinence: No and Cauda equina syndrome: No   WEIGHT BEARING RESTRICTIONS: No  FALLS:  Has patient fallen in last 6 months? No  LIVING ENVIRONMENT: Lives with: lives with their spouse Lives in: House/apartment Stairs: Yes: Internal: 2 steps; none and External: 0 steps; none Has following equipment at home: Single point cane  OCCUPATION: Housewife  PLOF: Independent  PATIENT GOALS: decrease pain  NEXT MD VISIT: none at the moment  OBJECTIVE:  Note: Objective measures were completed at Evaluation unless otherwise noted.  DIAGNOSTIC FINDINGS:    PATIENT SURVEYS:  Modified Oswestry Low Back Pain Disability Questionnaire 62% (04/22/2024)  COGNITION: Overall cognitive status: Within functional limits for tasks assessed     SENSATION:   MUSCLE LENGTH:   POSTURE: forward neck, B protracted shoulders, L trunk side bend, movement preference around L4/L5 area, L weight shifting.   PALPATION: TTP R low back, increased B lumbar paraspinal muscle tension R > L  TTP R L5 > L4 > L3 TTP R low back > L side     LUMBAR ROM:   AROM eval  Flexion WFL, R posterior hip pain when returning to the upright position  Extension Very limited with R posterior hip pain  Right lateral flexion Limited with R lateral hip joint pain (worse than L lateral flexion)  Left lateral flexion Limited with R lateral hip pain  Right rotation [facility] with R posterior lateral hip pain worse than L rotation  Left rotation WFL with R posterior lateral hip pain.    (Blank rows = not tested)  LOWER EXTREMITY ROM:     Passive  Right 05/31/2024 Left 05/31/2024  Hip flexion    Hip extension    Hip  abduction    Hip adduction    Hip internal rotation 37 18  Hip external rotation 37 with R low back pain (different pain) 34  Knee flexion    Knee extension    Ankle dorsiflexion    Ankle plantarflexion    Ankle inversion    Ankle eversion     (Blank rows = not tested)  LOWER EXTREMITY MMT:    MMT Right eval Left eval  Hip flexion    Hip extension (seated manually resisted) 3+ 3+  Hip abduction (seated manually resisted) 4- 4-  Hip adduction    Hip internal rotation    Hip external rotation    Knee flexion 4 4  Knee extension 4+ 4+  Ankle dorsiflexion    Ankle plantarflexion    Ankle inversion    Ankle eversion     (Blank rows = not tested)  LUMBAR SPECIAL TESTS:    FUNCTIONAL TESTS:    GAIT: Distance walked: 30 ft Assistive device utilized: None Level of assistance: SBA Comments: Decreased stance R LE, antalgic with R trunk side bend during R LE stance phase, decreased B knee extension during stance phase, decreased foot clearance and gait velocity.   TREATMENT DATE: 06/17/2024  Manual therapy   Seated STM to B lumbar paraspinal muscles to decrease tesnion.   Neuromuscular Re-education   Seated transversus abdominis contraction with forward lean on table 10x10 seconds    Standing with B UE assist   With slight forward trunk flexion position   Hip abduction     R 10x3    L 10x3    Hip extension     R 10x2    L 10x2  Seated trunk flexion stretch 30 seconds x 3     Seated hip adduction isometrics small blue ball squeeze 10x2 with 5 second holds             Slight decrease in R LE symptoms.   Sitting with upright posture   Transversus abdominis contraction with B shoulder extension isometrics, hands on thighs. 10x5 seconds  Standing B shoulder extension with scapular retraction yellow band 10x5 seconds   R LE  symptoms   Seated trunk flexion stretch 30 seconds x 2  Decreased R LE symptoms.     Improved exercise technique, movement at target joints, use of target muscles after mod verbal, visual, tactile cues.     PATIENT EDUCATION:  Education details: there-ex, HEP, POC Person educated: Patient Education method: Explanation, Demonstration, Tactile cues, Verbal cues, and Handouts Education comprehension: verbalized understanding and returned demonstration  HOME EXERCISE PROGRAM: Access Code: [email or web address] URL: [email or web address] Date: 04/22/2024 Prepared by: Emil Glassman  Exercises - Seated Transversus Abdominis Bracing  - 3 x daily - 7 x weekly - 3 sets - 10 reps - 5 seconds hold  Upgraded to standing on (06/15/2024) - Seated Gluteal Sets  - 3 x daily - 7 x weekly - 3 sets - 10 reps - 5 seconds hold  Upgraded to standing on (06/15/2024)  Reclined, hooklying hip extension isometrics with leg straight  R 10x5 seconds for 3 sets daily.  Drawing provided.      - Supine Transversus Abdominis Bracing - Hands on Stomach  - 5 x daily - 7 x weekly - 3 sets - 10 reps - 5 seconds hold - Supine Posterior Pelvic Tilt  - 2 x daily - 7 x weekly - 3 sets - 5 reps - 15 seconds hold  - Bent Knee Fallouts with Alternating Legs  - 1 x daily - 7 x weekly - 3 sets - 5 reps  - Supine Piriformis Stretch with Towel  - 3 x daily - 7 x weekly - 1 sets - 5 reps - 30 seconds hold   Sitting with lumbar towel roll 2 min  - Seated Flexion Stretch  - 3 x daily - 7 x weekly - 1 sets - 5 reps - 30 seconds hold  - Standing Gluteal Sets  - 1 x daily - 7 x weekly - 3 sets - 10 reps - 10 seconds hold - Standing Transverse Abdominis Contraction  - 1 x daily - 7 x weekly - 3 sets - 10 reps - 10 seconds hold  ASSESSMENT:  CLINICAL IMPRESSION:  Continued working on improving trunk and glute strength to promote lumbopelvic stability to decrease stress to low back when ambulating and performing standing  tasks.Fair tolerance to today's session. Pt will benefit from continued skilled physical therapy services to decrease pain, improve strength and function.     OBJECTIVE IMPAIRMENTS: Abnormal gait, decreased balance, difficulty walking, decreased strength, improper body mechanics, postural dysfunction, and pain.   ACTIVITY LIMITATIONS: carrying, lifting, bending, sitting, standing, squatting, sleeping, stairs, transfers, bed mobility, dressing,  hygiene/grooming, and locomotion level  PARTICIPATION LIMITATIONS:   PERSONAL FACTORS: Age, Fitness, Time since onset of injury/illness/exacerbation, and 3+ comorbidities: anxiety, arthritis, depression, DM, HTN, sleep apnea are also affecting patient's functional outcome.   REHAB POTENTIAL: Fair    CLINICAL DECISION MAKING: Stable/uncomplicated Pain has worsened since onset per pt.   EVALUATION COMPLEXITY: Low   GOALS: Goals reviewed with patient? Yes  SHORT TERM GOALS: Target date: 05/07/2024  Pt will be independent with her initial HEP to decrease pain, improve strength, function, and ability to perform standing tasks more comfortably.  Baseline: Pt has started her initial HEP (04/22/2024); doing her HEP, no questions (06/02/2024) Goal status: MET   LONG TERM GOALS: Target date: 07/16/2024  Pt will have a decrease in low back pain to 4/10 or less at worst to promote ability to perform standing tasks more comfortably.  Baseline:8/10 low back pain at worst for the past 3 months (04/22/2024); 5/10 back pain at most for the past 7 days, also not currently wearing her back brace (06/02/2024), 5/10 at worst but when she vacuums, back pain goes up to a 6/10. Currently no longer using her back brace (06/07/2024); 6/10 at worst for the past 7 days (06/15/2024) Goal status: PROGRESSING  2.  Pt will have a decrease in R LE pain to 4/10 or less at worst to promote ability to ambulate and perform standing tasks more comfortably.  Baseline: 9/10 R LE pain at worst  for the past 3 months. (04/22/2024); 5/10 R LE pain at most for the past 7 days, does not hurt often like it used to (06/02/2024), 5/10 at most but when she vacuums, R LE pain goes up to a 6/10. Currently no longer using her back brace (06/07/2024) Goal status: PROGRESSING  3.  Pt will improve her Modified Oswestry Low Back Pain Disability Questionnaire by at least 20% as a demonstration of improved function.  Baseline: Modified Oswestry Low Back Pain Disability Questionnaire 62% (04/22/2024); 46 % (06/07/2024) Goal status: PROGRESSING  4.  Pt will improve her B hip extension and abduction strength by at least 1/2 MMT grade to promote ability to ambulate and perform standing tasks more comfortably.  Baseline:  MMT Right eval Left eval R (06/07/2024) L (06/07/2024)  Hip extension (seated manually resisted) 3+ 3+ 4+ 4  Hip abduction (seated manually resisted) 4- 4- 4+ 4+   (04/22/2024)  Goal status: MET   PLAN:  PT FREQUENCY: 1-2x/week  PT DURATION: 4 weeks  PLANNED INTERVENTIONS: 97110-Therapeutic exercises, 97530- Therapeutic activity, 97112- Neuromuscular re-education, 97535- Self Care, [id number]- Manual therapy, [id number]- Gait training, [id number]- Aquatic Therapy, [phone]- Electrical stimulation (unattended), [phone]- Traction (mechanical), [id number]- Ionotophoresis 4mg /ml Dexamethasone , Patient/Family education, Balance training, Stair training, Dry Needling, Joint mobilization, and Spinal mobilization.  PLAN FOR NEXT SESSION: Posture, trunk and glute strengthening, manual techniques, modalities PRN    Nora Sabey, PT, DPT 06/17/2024, 12:13 PM

## 2024-06-22 ENCOUNTER — Ambulatory Visit

## 2024-06-22 DIAGNOSIS — M5459 Other low back pain: Secondary | ICD-10-CM

## 2024-06-22 DIAGNOSIS — R262 Difficulty in walking, not elsewhere classified: Secondary | ICD-10-CM

## 2024-06-22 DIAGNOSIS — Z79899 Other long term (current) drug therapy: Secondary | ICD-10-CM | POA: Insufficient documentation

## 2024-06-22 DIAGNOSIS — M5416 Radiculopathy, lumbar region: Secondary | ICD-10-CM

## 2024-06-22 DIAGNOSIS — M899 Disorder of bone, unspecified: Secondary | ICD-10-CM | POA: Insufficient documentation

## 2024-06-22 DIAGNOSIS — Z789 Other specified health status: Secondary | ICD-10-CM | POA: Insufficient documentation

## 2024-06-22 NOTE — Therapy (Signed)
 OUTPATIENT PHYSICAL THERAPY TREATMENT   Patient Name: Emma White MRN: [id number] DOB:1956-06-14, 68 y.o., female Today's Date: 06/22/2024  END OF SESSION:  PT End of Session - 06/22/24 1302     Visit Number 14    Number of Visits 25    Date for PT Re-Evaluation 07/16/24    PT Start Time 1302    PT Stop Time 1344    PT Time Calculation (min) 42 min    Activity Tolerance Patient tolerated treatment well    Behavior During Therapy [facility] for tasks assessed/performed                       Past Medical History:  Diagnosis Date   Anxiety    Arthritis    hand   Complication of anesthesia 03/18/2022   Hypoxia in pacu. See pacu note 03/18/2022. The hospitalist was consulted for further work-up.   Depression    Diabetes mellitus without complication (HCC)    Dyspnea    DOE   Hyperlipemia    Hypertension    Neuropathy    Sleep apnea    Past Surgical History:  Procedure Laterality Date   ABDOMINAL HYSTERECTOMY  1995   BIOPSY  07/10/2023   Procedure: BIOPSY;  Surgeon: Onita Elspeth Sharper, DO;  Location: [facility] ENDOSCOPY;  Service: Gastroenterology;;   CATARACT EXTRACTION W/PHACO Left 09/25/2017   Procedure: CATARACT EXTRACTION PHACO AND INTRAOCULAR LENS PLACEMENT (IOC);  Surgeon: Myrna Adine Anes, MD;  Location: [facility] ORS;  Service: Ophthalmology;  Laterality: Left;   flui pack lot # [id number] H  US  00:32.7 AP%   12.17 CDE   3.95   CATARACT EXTRACTION W/PHACO Right 10/16/2017   Procedure: CATARACT EXTRACTION PHACO AND INTRAOCULAR LENS PLACEMENT (IOC);  Surgeon: Myrna Adine Anes, MD;  Location: [facility] ORS;  Service: Ophthalmology;  Laterality: Right;  US  00:32.8 AP% 9.0 CDE 2.96 FLUID PACK LOT # [id number]   COLONOSCOPY WITH PROPOFOL  N/A 05/27/2016   Procedure: COLONOSCOPY WITH PROPOFOL ;  Surgeon: Lamar ONEIDA Holmes, MD;  Location: [facility] ENDOSCOPY;  Service: Endoscopy;  Laterality: N/A;   COLONOSCOPY WITH PROPOFOL  N/A 07/10/2021   Procedure: COLONOSCOPY WITH PROPOFOL ;   Surgeon: Maryruth Ole ONEIDA, MD;  Location: [facility] ENDOSCOPY;  Service: Endoscopy;  Laterality: N/A;   COLONOSCOPY WITH PROPOFOL  N/A 10/30/2021   Procedure: COLONOSCOPY WITH PROPOFOL ;  Surgeon: Maryruth Ole ONEIDA, MD;  Location: [facility] ENDOSCOPY;  Service: Endoscopy;  Laterality: N/A;   COLONOSCOPY WITH PROPOFOL  N/A 02/05/2022   Procedure: COLONOSCOPY WITH PROPOFOL ;  Surgeon: Maryruth Ole ONEIDA, MD;  Location: [facility] ENDOSCOPY;  Service: Endoscopy;  Laterality: N/A;   COLONOSCOPY WITH PROPOFOL  N/A 07/10/2023   Procedure: COLONOSCOPY WITH PROPOFOL ;  Surgeon: Onita Elspeth Sharper, DO;  Location: [facility] ENDOSCOPY;  Service: Gastroenterology;  Laterality: N/A;   ESOPHAGOGASTRODUODENOSCOPY (EGD) WITH PROPOFOL  N/A 07/10/2021   Procedure: ESOPHAGOGASTRODUODENOSCOPY (EGD) WITH PROPOFOL ;  Surgeon: Maryruth Ole ONEIDA, MD;  Location: [facility] ENDOSCOPY;  Service: Endoscopy;  Laterality: N/A;  IDDM   ESOPHAGOGASTRODUODENOSCOPY (EGD) WITH PROPOFOL  N/A 10/30/2021   Procedure: ESOPHAGOGASTRODUODENOSCOPY (EGD) WITH PROPOFOL ;  Surgeon: Maryruth Ole ONEIDA, MD;  Location: [facility] ENDOSCOPY;  Service: Endoscopy;  Laterality: N/A;  IDDM   EYE SURGERY Left 09/25/2017   cataract extraction   HYSTERECTOMY SUPRACERVICAL ABDOMINAL W/REMOVAL TUBES &/OR OVARIES  1995   KENALOG  INJECTION Right 10/16/2017   Procedure: KENALOG  INJECTION;  Surgeon: Myrna Adine Anes, MD;  Location: [facility] ORS;  Service: Ophthalmology;  Laterality: Right;   KNEE ARTHROCENTESIS Left 1981   KNEE ARTHROCENTESIS Bilateral 1992  right one in 1992   KNEE ARTHROCENTESIS Bilateral    POLYPECTOMY  07/10/2023   Procedure: POLYPECTOMY;  Surgeon: Onita Elspeth Sharper, DO;  Location: [facility] ENDOSCOPY;  Service: Gastroenterology;;   XI ROBOTIC LAPAROSCOPIC ASSISTED APPENDECTOMY N/A 03/18/2022   Procedure: XI ROBOTIC LAPAROSCOPIC ASSISTED APPENDECTOMY;  Surgeon: Rodolph Romano, MD;  Location: [facility] ORS;  Service: General;  Laterality: N/A;   Patient Active  Problem List   Diagnosis Date Noted   Pharmacologic therapy 06/22/2024   Disorder of skeletal system 06/22/2024   Problems influencing health status 06/22/2024   Functional heart murmur 05/24/2024   Displacement of lumbar intervertebral disc without myelopathy 03/07/2024   Hypoxemic respiratory failure, chronic (HCC) 09/10/2022   Obstructive sleep apnea 05/29/2022   Chronic diastolic CHF (congestive heart failure), NYHA class 2 (HCC) 04/18/2022   Anemia    History of laparoscopic appendectomy    Screening for osteoporosis 03/12/2022   Polyp of colon 12/03/2021   Abnormal mammogram 09/21/2021   Contusion of unspecified knee, initial encounter 08/21/2021   Encounter for screening for nutritional disorder 04/07/2020   Hearing loss 04/07/2020   Mixed hyperlipidemia 04/07/2020   Pain in right shoulder 04/07/2020   Elevated LFTs 09/28/2019   Insomnia due to medical condition 08/24/2019   Hypoxia 08/20/2019   Essential hypertension 08/20/2019   Depression 08/20/2019   Disease due to severe acute respiratory syndrome coronavirus 2 (SARS-CoV-2) 08/20/2019   Hyperlipidemia due to type 2 diabetes mellitus (HCC) 08/20/2019   Status post total hysterectomy 08/20/2019   Type 2 diabetes mellitus with diabetic polyneuropathy, with long-term current use of insulin  (HCC) 08/20/2019   Cough, unspecified 11/27/2018   Disorder associated with type 2 diabetes mellitus (HCC) 11/27/2018   Type 2 diabetes mellitus without complications (HCC) 01/06/2017   Major depressive disorder, recurrent episode, mild degree (HCC) 01/06/2017   Chronic pain syndrome 04/08/2016   Chronic low back pain 04/08/2016   Peripheral neuropathy 02/15/2015   Arthritis 08/10/2012   Esophageal reflux 08/10/2012    PCP: Autry Grayce LABOR, PA   REFERRING PROVIDER: Rocco Organ Dipakkumar, MD  REFERRING DIAG: M51.369 (ICD-10-CM) - Other intervertebral disc degeneration, lumbar region without mention of lumbar back pain or  lower extremity pain  Rationale for Evaluation and Treatment: Rehabilitation  THERAPY DIAG:  Other low back pain  Radiculopathy, lumbar region  Difficulty in walking, not elsewhere classified  ONSET DATE: December 2024  SUBJECTIVE:                                                                                                                                                                                           SUBJECTIVE STATEMENT: Did  her exercises yesterday and this morning. 5/10 low back and R LE pain currently. Going back to the pain clinic tomorrow. Was told that she needed shots for her back.     PERTINENT HISTORY:  Low back pain. Currently wearing a back brace which is not helping. Pt states having a bulging disc. Pain started December 2024, sudden onset, unknown mechanism of injury. Pt states having R Sciatic nerve in R posterior hip pain initially. Had x-ryas done, then and MRI which revealed bulging disc. Pain radiates along the R L5 dermatome as well as sciatic nerve (to superficial peroneal nerve distribution) to dorsal foot. R anterior ankle hurts. Pain has worsened since January 2025. Surgeon wants pt to do PT.      No latex allergies   Had L eye surgery 03/17/2024, pt states she is clear to participate in PT. 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  Blood pressure is controlled per pt.  DM is controlled per pt.  Has a CPAP machine.     PAIN:  Are you having pain? Yes: NPRS scale: 8/10 Pain location: Low back and R L5 dermatome and R sciatic nerve distribution to R dorsal foot.  Pain description: Low back: pressure; R LE: ache, tight Aggravating factors: Sleeping on her R side, standing and walking Relieving factors: leaning to the L, sleeping on her L side, leaning forward, wearing her back brace  PRECAUTIONS: Cannot see with L eye.     RED FLAGS: Bowel or bladder incontinence: No and Cauda equina syndrome: No   WEIGHT BEARING RESTRICTIONS: No  FALLS:  Has  patient fallen in last 6 months? No  LIVING ENVIRONMENT: Lives with: lives with their spouse Lives in: House/apartment Stairs: Yes: Internal: 2 steps; none and External: 0 steps; none Has following equipment at home: Single point cane  OCCUPATION: Housewife  PLOF: Independent  PATIENT GOALS: decrease pain  NEXT MD VISIT: none at the moment  OBJECTIVE:  Note: Objective measures were completed at Evaluation unless otherwise noted.  DIAGNOSTIC FINDINGS:    PATIENT SURVEYS:  Modified Oswestry Low Back Pain Disability Questionnaire 62% (04/22/2024)  COGNITION: Overall cognitive status: Within functional limits for tasks assessed     SENSATION:   MUSCLE LENGTH:   POSTURE: forward neck, B protracted shoulders, L trunk side bend, movement preference around L4/L5 area, L weight shifting.   PALPATION: TTP R low back, increased B lumbar paraspinal muscle tension R > L  TTP R L5 > L4 > L3 TTP R low back > L side     LUMBAR ROM:   AROM eval  Flexion WFL, R posterior hip pain when returning to the upright position  Extension Very limited with R posterior hip pain  Right lateral flexion Limited with R lateral hip joint pain (worse than L lateral flexion)  Left lateral flexion Limited with R lateral hip pain  Right rotation [facility] with R posterior lateral hip pain worse than L rotation  Left rotation WFL with R posterior lateral hip pain.    (Blank rows = not tested)  LOWER EXTREMITY ROM:     Passive  Right 05/31/2024 Left 05/31/2024  Hip flexion    Hip extension    Hip abduction    Hip adduction    Hip internal rotation 37 18  Hip external rotation 37 with R low back pain (different pain) 34  Knee flexion    Knee extension    Ankle dorsiflexion    Ankle plantarflexion    Ankle inversion  Ankle eversion     (Blank rows = not tested)  LOWER EXTREMITY MMT:    MMT Right eval Left eval  Hip flexion    Hip extension (seated manually resisted) 3+ 3+  Hip  abduction (seated manually resisted) 4- 4-  Hip adduction    Hip internal rotation    Hip external rotation    Knee flexion 4 4  Knee extension 4+ 4+  Ankle dorsiflexion    Ankle plantarflexion    Ankle inversion    Ankle eversion     (Blank rows = not tested)  LUMBAR SPECIAL TESTS:    FUNCTIONAL TESTS:    GAIT: Distance walked: 30 ft Assistive device utilized: None Level of assistance: SBA Comments: Decreased stance R LE, antalgic with R trunk side bend during R LE stance phase, decreased B knee extension during stance phase, decreased foot clearance and gait velocity.   TREATMENT DATE: 06/22/2024                                                                                                                                Neuromuscular Re-education   Sitting with upright posture in neutral   Manually resisted R trunk side bend isometrics (to decrease L trunk side bend posture) 10x5 seconds for 3 sets  Feels better per pt.    Seated trunk flexion gray physioball roll outs To the R  10x5 seconds   Increased R low back pain with increased repetition  To the L 10x5 seconds for 3 sets  Decreased R low back pain  Seated B shoulder extension with B scapular retraction red band 10x3  Standing shoulder adduction red band   R 7x5 seconds. Increased R low back and R LE symptoms.   L 7x5 seconds. Increased R low back and R LE symptoms.   Standing with B UE assist Forward step up onto first regular step   With R LE to promote R glute strengthening. 10x3   Decreased low back and R LE pain   Improved exercise technique, movement at target joints, use of target muscles after mod verbal, visual, tactile cues.     PATIENT EDUCATION:  Education details: there-ex, HEP, POC Person educated: Patient Education method: Explanation, Demonstration, Tactile cues, Verbal cues, and Handouts Education comprehension: verbalized understanding and returned demonstration  HOME EXERCISE  PROGRAM: Access Code: [email or web address] URL: [email or web address] Date: 04/22/2024 Prepared by: Emil Glassman  Exercises - Seated Transversus Abdominis Bracing  - 3 x daily - 7 x weekly - 3 sets - 10 reps - 5 seconds hold  Upgraded to standing on (06/15/2024) - Seated Gluteal Sets  - 3 x daily - 7 x weekly - 3 sets - 10 reps - 5 seconds hold  Upgraded to standing on (06/15/2024)  Reclined, hooklying hip extension isometrics with leg straight  R 10x5 seconds for 3 sets daily.  Drawing provided.      - Supine Transversus Abdominis Bracing - Hands  on Stomach  - 5 x daily - 7 x weekly - 3 sets - 10 reps - 5 seconds hold - Supine Posterior Pelvic Tilt  - 2 x daily - 7 x weekly - 3 sets - 5 reps - 15 seconds hold  - Bent Knee Fallouts with Alternating Legs  - 1 x daily - 7 x weekly - 3 sets - 5 reps  - Supine Piriformis Stretch with Towel  - 3 x daily - 7 x weekly - 1 sets - 5 reps - 30 seconds hold   Sitting with lumbar towel roll 2 min  - Seated Flexion Stretch  - 3 x daily - 7 x weekly - 1 sets - 5 reps - 30 seconds hold  - Standing Gluteal Sets  - 1 x daily - 7 x weekly - 3 sets - 10 reps - 10 seconds hold - Standing Transverse Abdominis Contraction  - 1 x daily - 7 x weekly - 3 sets - 10 reps - 10 seconds hold  - Forward Step Up with Counter Support  - 1 x daily - 7 x weekly - 3 sets - 10 reps   ASSESSMENT:  CLINICAL IMPRESSION:  Decreased low back and R LE pain with treatment to decrease R lumbar extension stress as well as decreasing R lumbar rotation posture via trunk and R glute max strengthening and trunk stretches. Decreased low back and R LE pain reported in standing and ambulation after session. Pt will benefit from continued skilled physical therapy services to decrease pain, improve strength and function.     OBJECTIVE IMPAIRMENTS: Abnormal gait, decreased balance, difficulty walking, decreased strength, improper body mechanics, postural dysfunction, and pain.    ACTIVITY LIMITATIONS: carrying, lifting, bending, sitting, standing, squatting, sleeping, stairs, transfers, bed mobility, dressing, hygiene/grooming, and locomotion level  PARTICIPATION LIMITATIONS:   PERSONAL FACTORS: Age, Fitness, Time since onset of injury/illness/exacerbation, and 3+ comorbidities: anxiety, arthritis, depression, DM, HTN, sleep apnea are also affecting patient's functional outcome.   REHAB POTENTIAL: Fair    CLINICAL DECISION MAKING: Stable/uncomplicated Pain has worsened since onset per pt.   EVALUATION COMPLEXITY: Low   GOALS: Goals reviewed with patient? Yes  SHORT TERM GOALS: Target date: 05/07/2024  Pt will be independent with her initial HEP to decrease pain, improve strength, function, and ability to perform standing tasks more comfortably.  Baseline: Pt has started her initial HEP (04/22/2024); doing her HEP, no questions (06/02/2024) Goal status: MET   LONG TERM GOALS: Target date: 07/16/2024  Pt will have a decrease in low back pain to 4/10 or less at worst to promote ability to perform standing tasks more comfortably.  Baseline:8/10 low back pain at worst for the past 3 months (04/22/2024); 5/10 back pain at most for the past 7 days, also not currently wearing her back brace (06/02/2024), 5/10 at worst but when she vacuums, back pain goes up to a 6/10. Currently no longer using her back brace (06/07/2024); 6/10 at worst for the past 7 days (06/15/2024) Goal status: PROGRESSING  2.  Pt will have a decrease in R LE pain to 4/10 or less at worst to promote ability to ambulate and perform standing tasks more comfortably.  Baseline: 9/10 R LE pain at worst for the past 3 months. (04/22/2024); 5/10 R LE pain at most for the past 7 days, does not hurt often like it used to (06/02/2024), 5/10 at most but when she vacuums, R LE pain goes up to a 6/10. Currently no longer using  her back brace (06/07/2024) Goal status: PROGRESSING  3.  Pt will improve her Modified Oswestry  Low Back Pain Disability Questionnaire by at least 20% as a demonstration of improved function.  Baseline: Modified Oswestry Low Back Pain Disability Questionnaire 62% (04/22/2024); 46 % (06/07/2024) Goal status: PROGRESSING  4.  Pt will improve her B hip extension and abduction strength by at least 1/2 MMT grade to promote ability to ambulate and perform standing tasks more comfortably.  Baseline:  MMT Right eval Left eval R (06/07/2024) L (06/07/2024)  Hip extension (seated manually resisted) 3+ 3+ 4+ 4  Hip abduction (seated manually resisted) 4- 4- 4+ 4+   (04/22/2024)  Goal status: MET   PLAN:  PT FREQUENCY: 1-2x/week  PT DURATION: 4 weeks  PLANNED INTERVENTIONS: 97110-Therapeutic exercises, 97530- Therapeutic activity, 97112- Neuromuscular re-education, 97535- Self Care, [id number]- Manual therapy, [id number]- Gait training, [phone]- Aquatic Therapy, [phone]- Electrical stimulation (unattended), [phone]- Traction (mechanical), [id number]- Ionotophoresis 4mg /ml Dexamethasone , Patient/Family education, Balance training, Stair training, Dry Needling, Joint mobilization, and Spinal mobilization.  PLAN FOR NEXT SESSION: Posture, trunk and glute strengthening, manual techniques, modalities PRN    Kamil Mchaffie, PT, DPT 06/22/2024, 1:48 PM

## 2024-06-22 NOTE — Progress Notes (Unsigned)
 PROVIDER NOTE: Interpretation of information contained herein should be left to medically-trained personnel. Specific patient instructions are provided elsewhere under Patient Instructions section of medical record. This document was created in part using AI and STT-dictation technology, any transcriptional errors that may result from this process are unintentional.  Patient: Emma White  Service: E/M Encounter  Provider: Eric DELENA Como, MD  DOB: 1956/01/07  Delivery: Face-to-face  Specialty: Interventional Pain Management  MRN: [id number]  Setting: Ambulatory outpatient facility  Specialty designation: 09  Type: New Patient  Location: Outpatient office facility  PCP: Autry Grayce DELENA, PA  DOS: 06/23/2024    Referring Prov.: Autry, Grayce DELENA, PA   Primary Reason(s) for Visit: Encounter for initial evaluation of one or more chronic problems (new to examiner) potentially causing chronic pain, and posing a threat to normal musculoskeletal function. (Level of risk: High) CC: No chief complaint on file.  HPI  Emma White is a 68 y.o. year old, female patient, who comes for the first time to our practice referred by Autry Grayce DELENA, PA for our initial evaluation of her chronic pain. She has Hypoxia; Essential hypertension; Type 2 diabetes mellitus without complications (HCC); Depression; Anemia; History of laparoscopic appendectomy; Abnormal mammogram; Arthritis; Chronic diastolic CHF (congestive heart failure), NYHA class 2 (HCC); Chronic pain syndrome; Chronic low back pain; Contusion of unspecified knee, initial encounter; Cough, unspecified; Disease due to severe acute respiratory syndrome coronavirus 2 (SARS-CoV-2); Displacement of lumbar intervertebral disc without myelopathy; Elevated LFTs; Screening for osteoporosis; Encounter for screening for nutritional disorder; Esophageal reflux; Functional heart murmur; Hearing loss; Disorder associated with type 2 diabetes mellitus (HCC); Hyperlipidemia due  to type 2 diabetes mellitus (HCC); Hypoxemic respiratory failure, chronic (HCC); Insomnia due to medical condition; Major depressive disorder, recurrent episode, mild degree (HCC); Mixed hyperlipidemia; Obstructive sleep apnea; Pain in right shoulder; Peripheral neuropathy; Polyp of colon; Status post total hysterectomy; Type 2 diabetes mellitus with diabetic polyneuropathy, with long-term current use of insulin  (HCC); Pharmacologic therapy; Disorder of skeletal system; and Problems influencing health status on their problem list. Today she comes in for evaluation of her No chief complaint on file.  Pain Assessment: Location:     Radiating:   Onset:   Duration:   Quality:   Severity:  /10 (subjective, self-reported pain score)  Effect on ADL:   Timing:   Modifying factors:   BP:    HR:    Onset and Duration: {Hx; Onset and Duration:210120511} Cause of pain: {Hx; Cause:210120521} Severity: {Pain Severity:210120502} Timing: {Symptoms; Timing:210120501} Aggravating Factors: {Causes; Aggravating pain factors:210120507} Alleviating Factors: {Causes; Alleviating Factors:210120500} Associated Problems: {Hx; Associated problems:210120515} Quality of Pain: {Hx; Symptom quality or Descriptor:210120531} Previous Examinations or Tests: {Hx; Previous examinations or test:210120529} Previous Treatments: {Hx; Previous Treatment:210120503}  Emma White is being evaluated for possible interventional pain management therapies for the treatment of her chronic pain.  Discussed the use of AI scribe software for clinical note transcription with the patient, who gave verbal consent to proceed.  History of Present Illness           ***  Emma White has been informed that this initial visit was an evaluation only.  On the follow up appointment I will go over the results, including ordered tests and available interventional therapies. At that time she will have the opportunity to decide whether to proceed with  offered therapies or not. In the event that Emma White prefers avoiding interventional options, this will conclude our involvement in the case.  Medication management recommendations  may be provided upon request.  Patient informed that diagnostic tests may be ordered to assist in identifying underlying causes, narrow the list of differential diagnoses and aid in determining candidacy for (or contraindications to) planned therapeutic interventions.  Historic Controlled Substance Pharmacotherapy Review PMP and historical list of controlled substances: Gabapentin  800 mg tablet, 1 tab p.o. 3 times daily (90/month) (# 270) (last filled on 06/04/2024) Most recently prescribed controlled substance(s): Opioid Analgesic: None MME/day: 0 mg/day  Historical Monitoring: The patient  reports no history of drug use. List of prior UDS Testing: No results found for: MDMA, COCAINSCRNUR, PCPSCRNUR, PCPQUANT, CANNABQUANT, THCU, ETH, CBDTHCR, D8THCCBX, D9THCCBX Historical Background Evaluation: [place] PMP: PDMP reviewed during this encounter. Review of the past 67-months conducted.             PMP NARX Score Report:  Narcotic: 000 Sedative: 000 Stimulant: 000 [place] Department of public safety, offender search: Engineer, mining Information) Non-contributory Risk Assessment Profile: Aberrant behavior: None observed or detected today Risk factors for fatal opioid overdose: None identified today PMP NARX Overdose Risk Score: 030 Fatal overdose hazard ratio (HR): Calculation deferred Non-fatal overdose hazard ratio (HR): Calculation deferred Risk of opioid abuse or dependence: 0.7-3.0% with doses <= 36 MME/day and 6.1-26% with doses >= 120 MME/day. Substance use disorder (SUD) risk level: See below Personal History of Substance Abuse (SUD-Substance use disorder):  Alcohol:    Illegal Drugs:    Rx Drugs:    ORT Risk Level calculation:    ORT Scoring interpretation table:  Score <3 = Low Risk for SUD   Score between 4-7 = Moderate Risk for SUD  Score >8 = High Risk for Opioid Abuse   PHQ-2 Depression Scale:  Total score:    PHQ-2 Scoring interpretation table: (Score and probability of major depressive disorder)  Score 0 = No depression  Score 1 = 15.4% Probability  Score 2 = 21.1% Probability  Score 3 = 38.4% Probability  Score 4 = 45.5% Probability  Score 5 = 56.4% Probability  Score 6 = 78.6% Probability   PHQ-9 Depression Scale:  Total score:    PHQ-9 Scoring interpretation table:  Score 0-4 = No depression  Score 5-9 = Mild depression  Score 10-14 = Moderate depression  Score 15-19 = Moderately severe depression  Score 20-27 = Severe depression (2.4 times higher risk of SUD and 2.89 times higher risk of overuse)   Pharmacologic Plan: As per protocol, I have not taken over any controlled substance management, pending the results of ordered tests and/or consults.            Initial impression: Pending review of available data and ordered tests.  Meds   Current Outpatient Medications:    acetaminophen  (TYLENOL ) 650 MG CR tablet, Take 650 mg by mouth 2 (two) times daily., Disp: , Rfl:    amLODipine  (NORVASC ) 10 MG tablet, Take 10 mg by mouth daily., Disp: , Rfl:    aspirin  81 MG chewable tablet, Chew 81 mg by mouth daily., Disp: , Rfl:    benzonatate (TESSALON) 100 MG capsule, Take 100 mg by mouth 3 (three) times daily as needed for cough., Disp: , Rfl:    Cholecalciferol (VITAMIN D3) 125 MCG (5000 UT) CAPS, Take by mouth., Disp: , Rfl:    citalopram  (CELEXA ) 20 MG tablet, Take 20 mg by mouth daily. With dinner, Disp: , Rfl:    gabapentin  (NEURONTIN ) 800 MG tablet, Take 800 mg by mouth 3 (three) times daily., Disp: , Rfl:    ibuprofen (  ADVIL,MOTRIN) 200 MG tablet, Take 400 mg by mouth every 6 (six) hours as needed for moderate pain., Disp: , Rfl:    insulin  aspart (NOVOLOG ) 100 UNIT/ML injection, Inject 10 Units into the skin 3 (three) times daily with meals., Disp: , Rfl:     insulin  detemir (LEVEMIR ) 100 UNIT/ML injection, Inject 30 Units into the skin at bedtime. , Disp: , Rfl:    metFORMIN (GLUCOPHAGE) 500 MG tablet, Take 500 mg by mouth 3 (three) times daily., Disp: , Rfl:    pantoprazole  (PROTONIX ) 40 MG tablet, Take 40 mg by mouth daily., Disp: , Rfl:    Potassium 99 MG TABS, Take 1 tablet by mouth daily. With breakfast, Disp: , Rfl:    Potassium Gluconate 2.5 MEQ TABS, Take 2.5 mEq by mouth daily., Disp: , Rfl:    pravastatin  (PRAVACHOL ) 10 MG tablet, Take 10 mg by mouth daily., Disp: , Rfl:    Semaglutide (OZEMPIC, 0.25 OR 0.5 MG/DOSE, [place]), Inject into the skin., Disp: , Rfl:    sitaGLIPtin (JANUVIA) 100 MG tablet, Take 100 mg by mouth daily., Disp: , Rfl:   Imaging Review   Complexity Note: No results found under the [facility] electronic medical record.                         ROS  Cardiovascular: {Hx; Cardiovascular History:210120525} Pulmonary or Respiratory: {Hx; Pumonary and/or Respiratory History:210120523} Neurological: {Hx; Neurological:210120504} Psychological-Psychiatric: {Hx; Psychological-Psychiatric History:210120512} Gastrointestinal: {Hx; Gastrointestinal:210120527} Genitourinary: {Hx; Genitourinary:210120506} Hematological: {Hx; Hematological:210120510} Endocrine: {Hx; Endocrine history:210120509} Rheumatologic: {Hx; Rheumatological:210120530} Musculoskeletal: {Hx; Musculoskeletal:210120528} Work History: {Hx; Work history:210120514}  Allergies  Ms. Teodoro has no known allergies.  Laboratory Chemistry Profile   Renal Lab Results  Component Value Date   BUN 14 03/19/2022   CREATININE 0.54 03/19/2022   GFRNONAA >60 03/19/2022     Electrolytes Lab Results  Component Value Date   NA 138 03/19/2022   K 4.0 03/19/2022   CL 105 03/19/2022   CALCIUM 9.0 03/19/2022     Hepatic No results found for: AST, ALT, ALBUMIN, ALKPHOS, AMYLASE, LIPASE, AMMONIA   ID Lab Results  Component Value Date   HIV Non  Reactive 03/18/2022     Bone No results found for: VD25OH, CI874NY7UNU, CI6874NY7, CI7874NY7, 25OHVITD1, 25OHVITD2, 25OHVITD3, TESTOFREE, TESTOSTERONE   Endocrine Lab Results  Component Value Date   GLUCOSE 227 (H) 03/19/2022   HGBA1C 9.5 (H) 03/18/2022     Neuropathy Lab Results  Component Value Date   HGBA1C 9.5 (H) 03/18/2022   HIV Non Reactive 03/18/2022     CNS No results found for: COLORCSF, APPEARCSF, RBCCOUNTCSF, WBCCSF, POLYSCSF, LYMPHSCSF, EOSCSF, PROTEINCSF, GLUCCSF, JCVIRUS, CSFOLI, IGGCSF, LABACHR, ACETBL   Inflammation (CRP: Acute  ESR: Chronic) No results found for: CRP, ESRSEDRATE, LATICACIDVEN   Rheumatology No results found for: RF, ANA, LABURIC, URICUR, LYMEIGGIGMAB, LYMEABIGMQN, HLAB27   Coagulation Lab Results  Component Value Date   PLT 251 03/19/2022     Cardiovascular Lab Results  Component Value Date   HGB 10.1 (L) 03/19/2022   HCT 32.4 (L) 03/19/2022     Screening Lab Results  Component Value Date   HIV Non Reactive 03/18/2022     Cancer No results found for: CEA, CA125, LABCA2   Allergens No results found for: ALMOND, APPLE, ASPARAGUS, AVOCADO, BANANA, BARLEY, BASIL, BAYLEAF, GREENBEAN, LIMABEAN, WHITEBEAN, BEEFIGE, REDBEET, BLUEBERRY, BROCCOLI, CABBAGE, MELON, CARROT, CASEIN, CASHEWNUT, CAULIFLOWER, CELERY     Note: Lab results reviewed.  PFSH  Drug: Ms. Tindall  reports no history of drug use. Alcohol:  reports no history of alcohol use. Tobacco:  reports that she has never smoked. She has never used smokeless tobacco. Medical:  has a past medical history of Anxiety, Arthritis, Complication of anesthesia (03/18/2022), Depression, Diabetes mellitus without complication (HCC), Dyspnea, Hyperlipemia, Hypertension, Neuropathy, and Sleep apnea. Family: family history is not on file.  Past Surgical History:  Procedure  Laterality Date   ABDOMINAL HYSTERECTOMY  1995   BIOPSY  07/10/2023   Procedure: BIOPSY;  Surgeon: Onita Elspeth Sharper, DO;  Location: [facility] ENDOSCOPY;  Service: Gastroenterology;;   CATARACT EXTRACTION W/PHACO Left 09/25/2017   Procedure: CATARACT EXTRACTION PHACO AND INTRAOCULAR LENS PLACEMENT (IOC);  Surgeon: Myrna Adine Anes, MD;  Location: [facility] ORS;  Service: Ophthalmology;  Laterality: Left;   flui pack lot # [id number] H  US  00:32.7 AP%   12.17 CDE   3.95   CATARACT EXTRACTION W/PHACO Right 10/16/2017   Procedure: CATARACT EXTRACTION PHACO AND INTRAOCULAR LENS PLACEMENT (IOC);  Surgeon: Myrna Adine Anes, MD;  Location: [facility] ORS;  Service: Ophthalmology;  Laterality: Right;  US  00:32.8 AP% 9.0 CDE 2.96 FLUID PACK LOT # [id number]   COLONOSCOPY WITH PROPOFOL  N/A 05/27/2016   Procedure: COLONOSCOPY WITH PROPOFOL ;  Surgeon: Lamar ONEIDA Holmes, MD;  Location: [facility] ENDOSCOPY;  Service: Endoscopy;  Laterality: N/A;   COLONOSCOPY WITH PROPOFOL  N/A 07/10/2021   Procedure: COLONOSCOPY WITH PROPOFOL ;  Surgeon: Maryruth Ole ONEIDA, MD;  Location: [facility] ENDOSCOPY;  Service: Endoscopy;  Laterality: N/A;   COLONOSCOPY WITH PROPOFOL  N/A 10/30/2021   Procedure: COLONOSCOPY WITH PROPOFOL ;  Surgeon: Maryruth Ole ONEIDA, MD;  Location: [facility] ENDOSCOPY;  Service: Endoscopy;  Laterality: N/A;   COLONOSCOPY WITH PROPOFOL  N/A 02/05/2022   Procedure: COLONOSCOPY WITH PROPOFOL ;  Surgeon: Maryruth Ole ONEIDA, MD;  Location: [facility] ENDOSCOPY;  Service: Endoscopy;  Laterality: N/A;   COLONOSCOPY WITH PROPOFOL  N/A 07/10/2023   Procedure: COLONOSCOPY WITH PROPOFOL ;  Surgeon: Onita Elspeth Sharper, DO;  Location: [facility] ENDOSCOPY;  Service: Gastroenterology;  Laterality: N/A;   ESOPHAGOGASTRODUODENOSCOPY (EGD) WITH PROPOFOL  N/A 07/10/2021   Procedure: ESOPHAGOGASTRODUODENOSCOPY (EGD) WITH PROPOFOL ;  Surgeon: Maryruth Ole ONEIDA, MD;  Location: [facility] ENDOSCOPY;  Service: Endoscopy;  Laterality: N/A;  IDDM    ESOPHAGOGASTRODUODENOSCOPY (EGD) WITH PROPOFOL  N/A 10/30/2021   Procedure: ESOPHAGOGASTRODUODENOSCOPY (EGD) WITH PROPOFOL ;  Surgeon: Maryruth Ole ONEIDA, MD;  Location: [facility] ENDOSCOPY;  Service: Endoscopy;  Laterality: N/A;  IDDM   EYE SURGERY Left 09/25/2017   cataract extraction   HYSTERECTOMY SUPRACERVICAL ABDOMINAL W/REMOVAL TUBES &/OR OVARIES  1995   KENALOG  INJECTION Right 10/16/2017   Procedure: KENALOG  INJECTION;  Surgeon: Myrna Adine Anes, MD;  Location: [facility] ORS;  Service: Ophthalmology;  Laterality: Right;   KNEE ARTHROCENTESIS Left 1981   KNEE ARTHROCENTESIS Bilateral 1992   right one in 1992   KNEE ARTHROCENTESIS Bilateral    POLYPECTOMY  07/10/2023   Procedure: POLYPECTOMY;  Surgeon: Onita Elspeth Sharper, DO;  Location: [facility] ENDOSCOPY;  Service: Gastroenterology;;   XI ROBOTIC LAPAROSCOPIC ASSISTED APPENDECTOMY N/A 03/18/2022   Procedure: XI ROBOTIC LAPAROSCOPIC ASSISTED APPENDECTOMY;  Surgeon: Rodolph Romano, MD;  Location: [facility] ORS;  Service: General;  Laterality: N/A;   Active Ambulatory Problems    Diagnosis Date Noted   Hypoxia 08/20/2019   Essential hypertension 08/20/2019   Type 2 diabetes mellitus without complications (HCC) 01/06/2017   Depression 08/20/2019   Anemia    History of laparoscopic appendectomy    Abnormal mammogram 09/21/2021   Arthritis 08/10/2012   Chronic diastolic CHF (congestive heart failure), NYHA class 2 (  HCC) 04/18/2022   Chronic pain syndrome 04/08/2016   Chronic low back pain 04/08/2016   Contusion of unspecified knee, initial encounter 08/21/2021   Cough, unspecified 11/27/2018   Disease due to severe acute respiratory syndrome coronavirus 2 (SARS-CoV-2) 08/20/2019   Displacement of lumbar intervertebral disc without myelopathy 03/07/2024   Elevated LFTs 09/28/2019   Screening for osteoporosis 03/12/2022   Encounter for screening for nutritional disorder 04/07/2020   Esophageal reflux 08/10/2012   Functional heart murmur  05/24/2024   Hearing loss 04/07/2020   Disorder associated with type 2 diabetes mellitus (HCC) 11/27/2018   Hyperlipidemia due to type 2 diabetes mellitus (HCC) 08/20/2019   Hypoxemic respiratory failure, chronic (HCC) 09/10/2022   Insomnia due to medical condition 08/24/2019   Major depressive disorder, recurrent episode, mild degree (HCC) 01/06/2017   Mixed hyperlipidemia 04/07/2020   Obstructive sleep apnea 05/29/2022   Pain in right shoulder 04/07/2020   Peripheral neuropathy 02/15/2015   Polyp of colon 12/03/2021   Status post total hysterectomy 08/20/2019   Type 2 diabetes mellitus with diabetic polyneuropathy, with long-term current use of insulin  (HCC) 08/20/2019   Pharmacologic therapy 06/22/2024   Disorder of skeletal system 06/22/2024   Problems influencing health status 06/22/2024   Resolved Ambulatory Problems    Diagnosis Date Noted   No Resolved Ambulatory Problems   Past Medical History:  Diagnosis Date   Anxiety    Complication of anesthesia 03/18/2022   Diabetes mellitus without complication (HCC)    Dyspnea    Hyperlipemia    Hypertension    Neuropathy    Sleep apnea    Constitutional Exam  General appearance: Well nourished, well developed, and well hydrated. In no apparent acute distress There were no vitals filed for this visit. BMI Assessment: Estimated body mass index is 28.47 kg/m as calculated from the following:   Height as of 07/10/23: 5' (1.524 m).   Weight as of 07/10/23: 145 lb 12.8 oz (66.1 kg).  BMI interpretation table: BMI level Category Range association with higher incidence of chronic pain  <18 kg/m2 Underweight   18.5-24.9 kg/m2 Ideal body weight   25-29.9 kg/m2 Overweight Increased incidence by 20%  30-34.9 kg/m2 Obese (Class I) Increased incidence by 68%  35-39.9 kg/m2 Severe obesity (Class II) Increased incidence by 136%  >40 kg/m2 Extreme obesity (Class III) Increased incidence by 254%   Patient's current BMI Ideal Body weight   There is no height or weight on file to calculate BMI. Patient weight not recorded   BMI Readings from Last 4 Encounters:  07/10/23 28.47 kg/m  03/18/22 30.27 kg/m  03/13/22 30.27 kg/m  02/05/22 29.88 kg/m   Wt Readings from Last 4 Encounters:  07/10/23 145 lb 12.8 oz (66.1 kg)  03/18/22 155 lb (70.3 kg)  03/13/22 155 lb (70.3 kg)  02/05/22 153 lb (69.4 kg)    Psych/Mental status: Alert, oriented x 3 (person, place, & time)       Eyes: PERLA Respiratory: No evidence of acute respiratory distress  Assessment  Primary Diagnosis & Pertinent Problem List: The primary encounter diagnosis was Chronic pain syndrome. Diagnoses of Pharmacologic therapy, Disorder of skeletal system, and Problems influencing health status were also pertinent to this visit.  Visit Diagnosis (New problems to examiner): 1. Chronic pain syndrome   2. Pharmacologic therapy   3. Disorder of skeletal system   4. Problems influencing health status    Plan of Care (Initial workup plan)  Note: Ms. Mofield was reminded that as per protocol, today's  visit has been an evaluation only. We have not taken over the patient's controlled substance management.  Problem-specific plan: Assessment and Plan            Lab Orders  No laboratory test(s) ordered today   Imaging Orders  No imaging studies ordered today   Referral Orders  No referral(s) requested today   Procedure Orders    No procedure(s) ordered today   Pharmacotherapy (current): Medications ordered:  No orders of the defined types were placed in this encounter.  Medications administered during this visit: Emma RAMAN. [place] had no medications administered during this visit.   Analgesic Pharmacotherapy:  Opioid Analgesics: For patients currently taking or requesting to take opioid analgesics, in accordance with [place]  Medical Board Guidelines, we will assess their risks and indications for the use of these substances. After completing  our evaluation, we may offer recommendations, but we no longer take patients for medication management. The prescribing physician will ultimately decide, based on his/her training and level of comfort whether to adopt any of the recommendations, including whether or not to prescribe such medicines.  Membrane stabilizer: To be determined at a later time  Muscle relaxant: To be determined at a later time  NSAID: To be determined at a later time  Other analgesic(s): To be determined at a later time   Interventional management options: Ms. Wilmott was informed that there is no guarantee that she would be a candidate for interventional therapies. The decision will be based on the results of diagnostic studies, as well as Ms. Lambrecht's risk profile.  Procedure(s) under consideration:  Pending results of ordered studies     Interventional Therapies  Risk Factors  Considerations  Medical Comorbidities:     Planned  Pending:      Under consideration:   Pending   Completed: (Analgesic benefit)1  None at this time   Therapeutic  Palliative (PRN) options:   None established   Completed by other providers:   None reported  1(Analgesic benefit): Expressed in percentage (%). (Local anesthetic[LA] +/- sedation  L.A.Local Anesthetic  Steroid benefit  Ongoing benefit)   Provider-requested follow-up: No follow-ups on file.  Future Appointments  Date Time Provider Department Center  06/22/2024  1:00 PM Roda Emil SAUNDERS, PT [facility] None  06/23/2024  1:00 PM Tanya Glisson, MD [facility] None  06/25/2024 10:30 AM Roda Emil R, PT [facility] None  06/28/2024 11:15 AM Roda Emil R, PT [facility] None  06/30/2024 11:15 AM Roda Emil SAUNDERS, PT [facility] None  07/05/2024 11:15 AM Roda Emil SAUNDERS, PT [facility] None  07/07/2024 11:15 AM Roda Emil SAUNDERS, PT [facility] None  07/12/2024  2:30 PM Roda Emil SAUNDERS, PT [facility] None  07/14/2024  2:30 PM Roda Emil SAUNDERS, PT [facility] None   I discussed the  assessment and treatment plan with the patient. The patient was provided an opportunity to ask questions and all were answered. The patient agreed with the plan and demonstrated an understanding of the instructions.  Patient advised to call back or seek an in-person evaluation if the symptoms or condition worsens.  Duration of encounter: *** minutes.  Total time on encounter, as per AMA guidelines included both the face-to-face and non-face-to-face time personally spent by the physician and/or other qualified health care professional(s) on the day of the encounter (includes time in activities that require the physician or other qualified health care professional and does not include time in activities normally performed by clinical staff). Physician's time may include the following activities  when performed: Preparing to see the patient (e.g., pre-charting review of records, searching for previously ordered imaging, lab work, and nerve conduction tests) Review of prior analgesic pharmacotherapies. Reviewing PMP Interpreting ordered tests (e.g., lab work, imaging, nerve conduction tests) Performing post-procedure evaluations, including interpretation of diagnostic procedures Obtaining and/or reviewing separately obtained history Performing a medically appropriate examination and/or evaluation Counseling and educating the patient/family/caregiver Ordering medications, tests, or procedures Referring and communicating with other health care professionals (when not separately reported) Documenting clinical information in the electronic or other health record Independently interpreting results (not separately reported) and communicating results to the patient/ family/caregiver Care coordination (not separately reported)  Note by: Eric DELENA Como, MD (TTS and AI technology used. I apologize for any typographical errors that were not detected and corrected.) Date: 06/23/2024; Time: 7:54 AM

## 2024-06-23 ENCOUNTER — Encounter: Payer: Self-pay | Admitting: Pain Medicine

## 2024-06-23 ENCOUNTER — Ambulatory Visit
Admission: RE | Admit: 2024-06-23 | Discharge: 2024-06-23 | Disposition: A | Discharge status: Home / Self Care | Source: Ambulatory Visit | Attending: Pain Medicine | Admitting: Pain Medicine

## 2024-06-23 ENCOUNTER — Ambulatory Visit (HOSPITAL_BASED_OUTPATIENT_CLINIC_OR_DEPARTMENT_OTHER): Admitting: Pain Medicine

## 2024-06-23 VITALS — BP 122/60 | HR 84 | Temp 97.8°F | Resp 16 | Ht 60.0 in | Wt 145.0 lb

## 2024-06-23 DIAGNOSIS — M4807 Spinal stenosis, lumbosacral region: Secondary | ICD-10-CM

## 2024-06-23 DIAGNOSIS — M5417 Radiculopathy, lumbosacral region: Secondary | ICD-10-CM

## 2024-06-23 DIAGNOSIS — G894 Chronic pain syndrome: Secondary | ICD-10-CM | POA: Insufficient documentation

## 2024-06-23 DIAGNOSIS — G8929 Other chronic pain: Secondary | ICD-10-CM

## 2024-06-23 DIAGNOSIS — M5410 Radiculopathy, site unspecified: Secondary | ICD-10-CM | POA: Insufficient documentation

## 2024-06-23 DIAGNOSIS — M51372 Other intervertebral disc degeneration, lumbosacral region with discogenic back pain and lower extremity pain: Secondary | ICD-10-CM | POA: Insufficient documentation

## 2024-06-23 DIAGNOSIS — R937 Abnormal findings on diagnostic imaging of other parts of musculoskeletal system: Secondary | ICD-10-CM | POA: Insufficient documentation

## 2024-06-23 DIAGNOSIS — M25551 Pain in right hip: Secondary | ICD-10-CM

## 2024-06-23 DIAGNOSIS — M25561 Pain in right knee: Secondary | ICD-10-CM | POA: Insufficient documentation

## 2024-06-23 DIAGNOSIS — Z789 Other specified health status: Secondary | ICD-10-CM

## 2024-06-23 DIAGNOSIS — M545 Low back pain, unspecified: Secondary | ICD-10-CM | POA: Insufficient documentation

## 2024-06-23 DIAGNOSIS — M48061 Spinal stenosis, lumbar region without neurogenic claudication: Secondary | ICD-10-CM

## 2024-06-23 DIAGNOSIS — M79604 Pain in right leg: Secondary | ICD-10-CM

## 2024-06-23 DIAGNOSIS — M25651 Stiffness of right hip, not elsewhere classified: Secondary | ICD-10-CM

## 2024-06-23 DIAGNOSIS — M4186 Other forms of scoliosis, lumbar region: Secondary | ICD-10-CM | POA: Insufficient documentation

## 2024-06-23 DIAGNOSIS — M899 Disorder of bone, unspecified: Secondary | ICD-10-CM | POA: Insufficient documentation

## 2024-06-23 DIAGNOSIS — M47816 Spondylosis without myelopathy or radiculopathy, lumbar region: Secondary | ICD-10-CM | POA: Insufficient documentation

## 2024-06-23 DIAGNOSIS — M25661 Stiffness of right knee, not elsewhere classified: Secondary | ICD-10-CM

## 2024-06-23 DIAGNOSIS — M5441 Lumbago with sciatica, right side: Secondary | ICD-10-CM | POA: Insufficient documentation

## 2024-06-23 DIAGNOSIS — M25562 Pain in left knee: Secondary | ICD-10-CM | POA: Diagnosis present

## 2024-06-23 DIAGNOSIS — Z79899 Other long term (current) drug therapy: Secondary | ICD-10-CM

## 2024-06-23 DIAGNOSIS — M5459 Other low back pain: Secondary | ICD-10-CM | POA: Insufficient documentation

## 2024-06-23 DIAGNOSIS — M5126 Other intervertebral disc displacement, lumbar region: Secondary | ICD-10-CM

## 2024-06-23 NOTE — Patient Instructions (Signed)
 Patient going for xrays today and will have a 2 week follow up appointment.

## 2024-06-25 ENCOUNTER — Ambulatory Visit

## 2024-06-25 DIAGNOSIS — R262 Difficulty in walking, not elsewhere classified: Secondary | ICD-10-CM

## 2024-06-25 DIAGNOSIS — M5416 Radiculopathy, lumbar region: Secondary | ICD-10-CM

## 2024-06-25 DIAGNOSIS — M5459 Other low back pain: Secondary | ICD-10-CM | POA: Diagnosis not present

## 2024-06-25 NOTE — Therapy (Signed)
 OUTPATIENT PHYSICAL THERAPY TREATMENT   Patient Name: Emma White MRN: [id number] DOB:Jun 21, 1956, 68 y.o., female Today's Date: 06/25/2024  END OF SESSION:  PT End of Session - 06/25/24 1033     Visit Number 15    Number of Visits 25    Date for PT Re-Evaluation 07/16/24    PT Start Time 1034    PT Stop Time 1117    PT Time Calculation (min) 43 min    Activity Tolerance Patient tolerated treatment well    Behavior During Therapy [facility] for tasks assessed/performed                        Past Medical History:  Diagnosis Date   Anxiety    Arthritis    hand   Complication of anesthesia 03/18/2022   Hypoxia in pacu. See pacu note 03/18/2022. The hospitalist was consulted for further work-up.   Depression    Diabetes mellitus without complication (HCC)    Dyspnea    DOE   Hyperlipemia    Hypertension    Neuropathy    Sleep apnea    Past Surgical History:  Procedure Laterality Date   ABDOMINAL HYSTERECTOMY  1995   APPENDECTOMY     BIOPSY  07/10/2023   Procedure: BIOPSY;  Surgeon: Onita Elspeth Sharper, DO;  Location: [facility] ENDOSCOPY;  Service: Gastroenterology;;   CATARACT EXTRACTION W/PHACO Left 09/25/2017   Procedure: CATARACT EXTRACTION PHACO AND INTRAOCULAR LENS PLACEMENT (IOC);  Surgeon: Myrna Adine Anes, MD;  Location: [facility] ORS;  Service: Ophthalmology;  Laterality: Left;   flui pack lot # [id number]  US  00:32.7 AP%   12.17 CDE   3.95   CATARACT EXTRACTION W/PHACO Right 10/16/2017   Procedure: CATARACT EXTRACTION PHACO AND INTRAOCULAR LENS PLACEMENT (IOC);  Surgeon: Myrna Adine Anes, MD;  Location: [facility] ORS;  Service: Ophthalmology;  Laterality: Right;  US  00:32.8 AP% 9.0 CDE 2.96 FLUID PACK LOT # [id number]   COLONOSCOPY WITH PROPOFOL  N/A 05/27/2016   Procedure: COLONOSCOPY WITH PROPOFOL ;  Surgeon: Lamar ONEIDA Holmes, MD;  Location: [facility] ENDOSCOPY;  Service: Endoscopy;  Laterality: N/A;   COLONOSCOPY WITH PROPOFOL  N/A 07/10/2021   Procedure:  COLONOSCOPY WITH PROPOFOL ;  Surgeon: Maryruth Ole ONEIDA, MD;  Location: [facility] ENDOSCOPY;  Service: Endoscopy;  Laterality: N/A;   COLONOSCOPY WITH PROPOFOL  N/A 10/30/2021   Procedure: COLONOSCOPY WITH PROPOFOL ;  Surgeon: Maryruth Ole ONEIDA, MD;  Location: [facility] ENDOSCOPY;  Service: Endoscopy;  Laterality: N/A;   COLONOSCOPY WITH PROPOFOL  N/A 02/05/2022   Procedure: COLONOSCOPY WITH PROPOFOL ;  Surgeon: Maryruth Ole ONEIDA, MD;  Location: [facility] ENDOSCOPY;  Service: Endoscopy;  Laterality: N/A;   COLONOSCOPY WITH PROPOFOL  N/A 07/10/2023   Procedure: COLONOSCOPY WITH PROPOFOL ;  Surgeon: Onita Elspeth Sharper, DO;  Location: [facility] ENDOSCOPY;  Service: Gastroenterology;  Laterality: N/A;   ESOPHAGOGASTRODUODENOSCOPY (EGD) WITH PROPOFOL  N/A 07/10/2021   Procedure: ESOPHAGOGASTRODUODENOSCOPY (EGD) WITH PROPOFOL ;  Surgeon: Maryruth Ole ONEIDA, MD;  Location: [facility] ENDOSCOPY;  Service: Endoscopy;  Laterality: N/A;  IDDM   ESOPHAGOGASTRODUODENOSCOPY (EGD) WITH PROPOFOL  N/A 10/30/2021   Procedure: ESOPHAGOGASTRODUODENOSCOPY (EGD) WITH PROPOFOL ;  Surgeon: Maryruth Ole ONEIDA, MD;  Location: [facility] ENDOSCOPY;  Service: Endoscopy;  Laterality: N/A;  IDDM   EYE SURGERY Left 09/25/2017   cataract extraction   HYSTERECTOMY SUPRACERVICAL ABDOMINAL W/REMOVAL TUBES &/OR OVARIES  1995   KENALOG  INJECTION Right 10/16/2017   Procedure: KENALOG  INJECTION;  Surgeon: Myrna Adine Anes, MD;  Location: [facility] ORS;  Service: Ophthalmology;  Laterality: Right;   KNEE ARTHROCENTESIS Left 1981  KNEE ARTHROCENTESIS Bilateral 1992   right one in 1992   KNEE ARTHROCENTESIS Bilateral    POLYPECTOMY  07/10/2023   Procedure: POLYPECTOMY;  Surgeon: Onita Elspeth Sharper, DO;  Location: [facility] ENDOSCOPY;  Service: Gastroenterology;;   XI ROBOTIC LAPAROSCOPIC ASSISTED APPENDECTOMY N/A 03/18/2022   Procedure: XI ROBOTIC LAPAROSCOPIC ASSISTED APPENDECTOMY;  Surgeon: Rodolph Romano, MD;  Location: [facility] ORS;  Service: General;   Laterality: N/A;   Patient Active Problem List   Diagnosis Date Noted   Chronic knee pain (Bilateral) (R>L) 06/23/2024   Chronic hip pain (Right) 06/23/2024   Impaired range of motion of hip (Right) 06/23/2024   Chronic lower extremity pain (Right) 06/23/2024   Impaired range of motion of knee (Right) 06/23/2024   Abnormal MRI, lumbar spine [facility]) (03/05/2024) 06/23/2024   Lumbosacral radiculopathy at L5 (Right) 06/23/2024   Chronic radicular pain of lower extremity (Right) 06/23/2024   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, L4-5) 06/23/2024   Foraminal stenosis of lumbosacral region (L5-S1) (SEVERE  on Right) 06/23/2024   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) 06/23/2024   Lumbar facet arthropathy (Bilateral: L2-3, L3-4, L4-5, L5-S1) 06/23/2024   Lumbar facet joint pain (Bilateral) 06/23/2024   Low back pain of over 3 months duration 06/23/2024   Low back pain potentially associated with radiculopathy 06/23/2024   Low back pain radiating to leg (Right) 06/23/2024   Multifactorial low back pain 06/23/2024   Dextroscoliosis of lumbar spine (L3-4) 06/23/2024   DDD of lumbosacral region w/ LBP & LEP 06/23/2024   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) 06/23/2024   Osteoarthritis of lumbar spine 06/23/2024   Pharmacologic therapy 06/22/2024   Disorder of skeletal system 06/22/2024   Problems influencing health status 06/22/2024   Functional heart murmur 05/24/2024   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) 03/07/2024   Hypoxemic respiratory failure, chronic (HCC) 09/10/2022   Obstructive sleep apnea 05/29/2022   Chronic diastolic CHF (congestive heart failure), NYHA class 2 (HCC) 04/18/2022   Anemia    History of laparoscopic appendectomy    Screening for osteoporosis 03/12/2022   Polyp of colon 12/03/2021   Abnormal mammogram 09/21/2021   Encounter for screening for nutritional disorder 04/07/2020   Hearing loss 04/07/2020    Mixed hyperlipidemia 04/07/2020   Shoulder pain (Right) 04/07/2020   Elevated LFTs 09/28/2019   Insomnia due to medical condition 08/24/2019   Hypoxia 08/20/2019   Essential hypertension 08/20/2019   Depression 08/20/2019   Disease due to severe acute respiratory syndrome coronavirus 2 (SARS-CoV-2) 08/20/2019   Hyperlipidemia due to type 2 diabetes mellitus (HCC) 08/20/2019   Status post total hysterectomy 08/20/2019   Type 2 diabetes mellitus with diabetic polyneuropathy, with long-term current use of insulin  (HCC) 08/20/2019   Cough, unspecified 11/27/2018   Disorder associated with type 2 diabetes mellitus (HCC) 11/27/2018   Type 2 diabetes mellitus without complications (HCC) 01/06/2017   Major depressive disorder, recurrent episode, mild degree (HCC) 01/06/2017   Chronic pain syndrome 04/08/2016   Chronic low back pain (Bilateral) (R>L) w/ sciatica (Right) 04/08/2016   Peripheral neuropathy 02/15/2015   Arthritis 08/10/2012   Esophageal reflux 08/10/2012    PCP: Autry Grayce LABOR, PA   REFERRING PROVIDER: Rocco Organ Dipakkumar, MD  REFERRING DIAG: M51.369 (ICD-10-CM) - Other intervertebral disc degeneration, lumbar region without mention of lumbar back pain or lower extremity pain  Rationale for Evaluation and Treatment: Rehabilitation  THERAPY DIAG:  Other low back pain  Radiculopathy, lumbar region  Difficulty in  walking, not elsewhere classified  ONSET DATE: December 2024  SUBJECTIVE:                                                                                                                                                                                           SUBJECTIVE STATEMENT: Went to the pain clinic yesterday and had x-rays for her back, hips, knees and ankles. Returns to pain clinic July 12, 2024. Might start her with her shots in her back. Back feels good, no pain currently. Has R posterior lateral hip pain , 5/10 currently. R LE does not have  pain in there right now.      PERTINENT HISTORY:  Low back pain. Currently wearing a back brace which is not helping. Pt states having a bulging disc. Pain started December 2024, sudden onset, unknown mechanism of injury. Pt states having R Sciatic nerve in R posterior hip pain initially. Had x-ryas done, then and MRI which revealed bulging disc. Pain radiates along the R L5 dermatome as well as sciatic nerve (to superficial peroneal nerve distribution) to dorsal foot. R anterior ankle hurts. Pain has worsened since January 2025. Surgeon wants pt to do PT.      No latex allergies   Had L eye surgery 03/17/2024, pt states she is clear to participate in PT. 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  Blood pressure is controlled per pt.  DM is controlled per pt.  Has a CPAP machine.     PAIN:  Are you having pain? Yes: NPRS scale: 8/10 Pain location: Low back and R L5 dermatome and R sciatic nerve distribution to R dorsal foot.  Pain description: Low back: pressure; R LE: ache, tight Aggravating factors: Sleeping on her R side, standing and walking Relieving factors: leaning to the L, sleeping on her L side, leaning forward, wearing her back brace  PRECAUTIONS: Cannot see with L eye.     RED FLAGS: Bowel or bladder incontinence: No and Cauda equina syndrome: No   WEIGHT BEARING RESTRICTIONS: No  FALLS:  Has patient fallen in last 6 months? No  LIVING ENVIRONMENT: Lives with: lives with their spouse Lives in: House/apartment Stairs: Yes: Internal: 2 steps; none and External: 0 steps; none Has following equipment at home: Single point cane  OCCUPATION: Housewife  PLOF: Independent  PATIENT GOALS: decrease pain  NEXT MD VISIT: none at the moment  OBJECTIVE:  Note: Objective measures were completed at Evaluation unless otherwise noted.  DIAGNOSTIC FINDINGS:    PATIENT SURVEYS:  Modified Oswestry Low Back Pain Disability Questionnaire 62% (04/22/2024)  COGNITION: Overall  cognitive status: Within functional limits for tasks  assessed     SENSATION:   MUSCLE LENGTH:   POSTURE: forward neck, B protracted shoulders, L trunk side bend, movement preference around L4/L5 area, L weight shifting.   PALPATION: TTP R low back, increased B lumbar paraspinal muscle tension R > L  TTP R L5 > L4 > L3 TTP R low back > L side     LUMBAR ROM:   AROM eval  Flexion WFL, R posterior hip pain when returning to the upright position  Extension Very limited with R posterior hip pain  Right lateral flexion Limited with R lateral hip joint pain (worse than L lateral flexion)  Left lateral flexion Limited with R lateral hip pain  Right rotation [facility] with R posterior lateral hip pain worse than L rotation  Left rotation WFL with R posterior lateral hip pain.    (Blank rows = not tested)  LOWER EXTREMITY ROM:     Passive  Right 05/31/2024 Left 05/31/2024  Hip flexion    Hip extension    Hip abduction    Hip adduction    Hip internal rotation 37 18  Hip external rotation 37 with R low back pain (different pain) 34  Knee flexion    Knee extension    Ankle dorsiflexion    Ankle plantarflexion    Ankle inversion    Ankle eversion     (Blank rows = not tested)  LOWER EXTREMITY MMT:    MMT Right eval Left eval  Hip flexion    Hip extension (seated manually resisted) 3+ 3+  Hip abduction (seated manually resisted) 4- 4-  Hip adduction    Hip internal rotation    Hip external rotation    Knee flexion 4 4  Knee extension 4+ 4+  Ankle dorsiflexion    Ankle plantarflexion    Ankle inversion    Ankle eversion     (Blank rows = not tested)  LUMBAR SPECIAL TESTS:    FUNCTIONAL TESTS:    GAIT: Distance walked: 30 ft Assistive device utilized: None Level of assistance: SBA Comments: Decreased stance R LE, antalgic with R trunk side bend during R LE stance phase, decreased B knee extension during stance phase, decreased foot clearance and gait velocity.    TREATMENT DATE: 06/25/2024                                                                                                                                Neuromuscular Re-education  Performed to promote more neutral lumbopelvic posture and decrease stress to low back and LE nerves.   TTP R glute med area with reproduction of hip pain.   Sitting with upright posture in neutral   Manually resisted R trunk side bend isometrics (to decrease L trunk side bend posture) 10x5 seconds for 3 sets    Seated trunk flexion gray physioball roll outs To the L 10x5 seconds for 3 sets  Decreased R low hip pain  Standing with B UE assist Forward step up onto first regular step   With R LE to promote R glute strengthening. 10x   Standing glute set 15 seconds   Feels better for R LE  Standing posterior pelvic tilt 10x5 seconds   Decreased R LE symptoms but increased L LE symptoms.   Standing glute set 10x5 seconds   Then with transversus abdominis contraction 6x5 seconds    Seated B shoulder extension isometrics with hands on knees 5x5 seconds  Decreased pain reported.    Improved exercise technique, movement at target joints, use of target muscles after mod verbal, visual, tactile cues.     PATIENT EDUCATION:  Education details: there-ex, HEP, POC Person educated: Patient Education method: Explanation, Demonstration, Tactile cues, Verbal cues, and Handouts Education comprehension: verbalized understanding and returned demonstration  HOME EXERCISE PROGRAM: Access Code: [email or web address] URL: [email or web address] Date: 04/22/2024 Prepared by: Emil Glassman  Exercises - Seated Transversus Abdominis Bracing  - 3 x daily - 7 x weekly - 3 sets - 10 reps - 5 seconds hold  Upgraded to standing on (06/15/2024) - Seated Gluteal Sets  - 3 x daily - 7 x weekly - 3 sets - 10 reps - 5 seconds hold  Upgraded to standing on (06/15/2024)  Reclined, hooklying hip extension isometrics with leg  straight  R 10x5 seconds for 3 sets daily.  Drawing provided.      - Supine Transversus Abdominis Bracing - Hands on Stomach  - 5 x daily - 7 x weekly - 3 sets - 10 reps - 5 seconds hold - Supine Posterior Pelvic Tilt  - 2 x daily - 7 x weekly - 3 sets - 5 reps - 15 seconds hold  - Bent Knee Fallouts with Alternating Legs  - 1 x daily - 7 x weekly - 3 sets - 5 reps  - Supine Piriformis Stretch with Towel  - 3 x daily - 7 x weekly - 1 sets - 5 reps - 30 seconds hold   Sitting with lumbar towel roll 2 min  - Seated Flexion Stretch  - 3 x daily - 7 x weekly - 1 sets - 5 reps - 30 seconds hold  - Standing Gluteal Sets  - 1 x daily - 7 x weekly - 3 sets - 10 reps - 10 seconds hold - Standing Transverse Abdominis Contraction  - 1 x daily - 7 x weekly - 3 sets - 10 reps - 10 seconds hold  - Forward Step Up with Counter Support  - 1 x daily - 7 x weekly - 3 sets - 10 reps   Seated B shoulder extension isometrics with hands on knees 5x5 seconds ASSESSMENT:  CLINICAL IMPRESSION: Centralizing R LE symptoms and overall improved low back pain based on subjective reports. Continued working on promoting a more neutral posture, decreasing extension stress, and improving trunk and glute strength to decrease stress to low back. Back feels better after session reported. Pt will benefit from continued skilled physical therapy services to decrease pain, improve strength and function.     OBJECTIVE IMPAIRMENTS: Abnormal gait, decreased balance, difficulty walking, decreased strength, improper body mechanics, postural dysfunction, and pain.   ACTIVITY LIMITATIONS: carrying, lifting, bending, sitting, standing, squatting, sleeping, stairs, transfers, bed mobility, dressing, hygiene/grooming, and locomotion level  PARTICIPATION LIMITATIONS:   PERSONAL FACTORS: Age, Fitness, Time since onset of injury/illness/exacerbation, and 3+ comorbidities: anxiety, arthritis, depression, DM, HTN, sleep apnea are also  affecting patient's functional outcome.   REHAB POTENTIAL:  Fair    CLINICAL DECISION MAKING: Stable/uncomplicated Pain has worsened since onset per pt.   EVALUATION COMPLEXITY: Low   GOALS: Goals reviewed with patient? Yes  SHORT TERM GOALS: Target date: 05/07/2024  Pt will be independent with her initial HEP to decrease pain, improve strength, function, and ability to perform standing tasks more comfortably.  Baseline: Pt has started her initial HEP (04/22/2024); doing her HEP, no questions (06/02/2024) Goal status: MET   LONG TERM GOALS: Target date: 07/16/2024  Pt will have a decrease in low back pain to 4/10 or less at worst to promote ability to perform standing tasks more comfortably.  Baseline:8/10 low back pain at worst for the past 3 months (04/22/2024); 5/10 back pain at most for the past 7 days, also not currently wearing her back brace (06/02/2024), 5/10 at worst but when she vacuums, back pain goes up to a 6/10. Currently no longer using her back brace (06/07/2024); 6/10 at worst for the past 7 days (06/15/2024) Goal status: PROGRESSING  2.  Pt will have a decrease in R LE pain to 4/10 or less at worst to promote ability to ambulate and perform standing tasks more comfortably.  Baseline: 9/10 R LE pain at worst for the past 3 months. (04/22/2024); 5/10 R LE pain at most for the past 7 days, does not hurt often like it used to (06/02/2024), 5/10 at most but when she vacuums, R LE pain goes up to a 6/10. Currently no longer using her back brace (06/07/2024) Goal status: PROGRESSING  3.  Pt will improve her Modified Oswestry Low Back Pain Disability Questionnaire by at least 20% as a demonstration of improved function.  Baseline: Modified Oswestry Low Back Pain Disability Questionnaire 62% (04/22/2024); 46 % (06/07/2024) Goal status: PROGRESSING  4.  Pt will improve her B hip extension and abduction strength by at least 1/2 MMT grade to promote ability to ambulate and perform standing tasks  more comfortably.  Baseline:  MMT Right eval Left eval R (06/07/2024) L (06/07/2024)  Hip extension (seated manually resisted) 3+ 3+ 4+ 4  Hip abduction (seated manually resisted) 4- 4- 4+ 4+   (04/22/2024)  Goal status: MET   PLAN:  PT FREQUENCY: 1-2x/week  PT DURATION: 4 weeks  PLANNED INTERVENTIONS: 97110-Therapeutic exercises, 97530- Therapeutic activity, 97112- Neuromuscular re-education, 97535- Self Care, [id number]- Manual therapy, [id number]- Gait training, [phone]- Aquatic Therapy, [phone]- Electrical stimulation (unattended), [phone]- Traction (mechanical), [id number]- Ionotophoresis 4mg /ml Dexamethasone , Patient/Family education, Balance training, Stair training, Dry Needling, Joint mobilization, and Spinal mobilization.  PLAN FOR NEXT SESSION: Posture, trunk and glute strengthening, manual techniques, modalities PRN    Raedyn Wenke, PT, DPT 06/25/2024, 11:20 AM

## 2024-06-27 LAB — COMPLIANCE DRUG ANALYSIS, UR

## 2024-06-28 ENCOUNTER — Ambulatory Visit

## 2024-06-28 DIAGNOSIS — M5416 Radiculopathy, lumbar region: Secondary | ICD-10-CM

## 2024-06-28 DIAGNOSIS — M5459 Other low back pain: Secondary | ICD-10-CM | POA: Diagnosis not present

## 2024-06-28 DIAGNOSIS — R262 Difficulty in walking, not elsewhere classified: Secondary | ICD-10-CM

## 2024-06-28 LAB — VITAMIN B12: Vitamin B-12: 242 pg/mL (ref 232–1245)

## 2024-06-28 LAB — C-REACTIVE PROTEIN: CRP: 1 mg/L (ref 0–10)

## 2024-06-28 LAB — MAGNESIUM: Magnesium: 1.6 mg/dL (ref 1.6–2.3)

## 2024-06-28 LAB — COMP. METABOLIC PANEL (12)
AST: 18 IU/L (ref 0–40)
Albumin: 4.6 g/dL (ref 3.9–4.9)
Alkaline Phosphatase: 73 IU/L (ref 44–121)
BUN/Creatinine Ratio: 17 (ref 12–28)
BUN: 13 mg/dL (ref 8–27)
Bilirubin Total: 0.4 mg/dL (ref 0.0–1.2)
Calcium: 9.8 mg/dL (ref 8.7–10.3)
Chloride: 101 mmol/L (ref 96–106)
Creatinine, Ser: 0.75 mg/dL (ref 0.57–1.00)
Globulin, Total: 2.3 g/dL (ref 1.5–4.5)
Glucose: 229 mg/dL — ABNORMAL HIGH (ref 70–99)
Potassium: 4.3 mmol/L (ref 3.5–5.2)
Sodium: 140 mmol/L (ref 134–144)
Total Protein: 6.9 g/dL (ref 6.0–8.5)
eGFR: 87 mL/min/1.73 (ref >59)

## 2024-06-28 LAB — 25-HYDROXY VITAMIN D LCMS D2+D3
25-Hydroxy, Vitamin D-2: <1 ng/mL
25-Hydroxy, Vitamin D-3: 56 ng/mL
25-Hydroxy, Vitamin D: 57 ng/mL

## 2024-06-28 LAB — SEDIMENTATION RATE: Sed Rate: 11 mm/h (ref 0–40)

## 2024-06-28 NOTE — Therapy (Signed)
 OUTPATIENT PHYSICAL THERAPY TREATMENT   Patient Name: Emma White MRN: [id number] DOB:08/07/1956, 68 y.o., female Today's Date: 06/28/2024  END OF SESSION:  PT End of Session - 06/28/24 1120     Visit Number 16    Number of Visits 25    Date for PT Re-Evaluation 07/16/24    PT Start Time 1120    PT Stop Time 1202    PT Time Calculation (min) 42 min    Activity Tolerance Patient tolerated treatment well    Behavior During Therapy [facility] for tasks assessed/performed                         Past Medical History:  Diagnosis Date   Anxiety    Arthritis    hand   Complication of anesthesia 03/18/2022   Hypoxia in pacu. See pacu note 03/18/2022. The hospitalist was consulted for further work-up.   Depression    Diabetes mellitus without complication (HCC)    Dyspnea    DOE   Hyperlipemia    Hypertension    Neuropathy    Sleep apnea    Past Surgical History:  Procedure Laterality Date   ABDOMINAL HYSTERECTOMY  1995   APPENDECTOMY     BIOPSY  07/10/2023   Procedure: BIOPSY;  Surgeon: Onita Elspeth Sharper, DO;  Location: [facility] ENDOSCOPY;  Service: Gastroenterology;;   CATARACT EXTRACTION W/PHACO Left 09/25/2017   Procedure: CATARACT EXTRACTION PHACO AND INTRAOCULAR LENS PLACEMENT (IOC);  Surgeon: Myrna Adine Anes, MD;  Location: [facility] ORS;  Service: Ophthalmology;  Laterality: Left;   flui pack lot # [id number]  US  00:32.7 AP%   12.17 CDE   3.95   CATARACT EXTRACTION W/PHACO Right 10/16/2017   Procedure: CATARACT EXTRACTION PHACO AND INTRAOCULAR LENS PLACEMENT (IOC);  Surgeon: Myrna Adine Anes, MD;  Location: [facility] ORS;  Service: Ophthalmology;  Laterality: Right;  US  00:32.8 AP% 9.0 CDE 2.96 FLUID PACK LOT # [id number]   COLONOSCOPY WITH PROPOFOL  N/A 05/27/2016   Procedure: COLONOSCOPY WITH PROPOFOL ;  Surgeon: Lamar ONEIDA Holmes, MD;  Location: [facility] ENDOSCOPY;  Service: Endoscopy;  Laterality: N/A;   COLONOSCOPY WITH PROPOFOL  N/A 07/10/2021   Procedure:  COLONOSCOPY WITH PROPOFOL ;  Surgeon: Maryruth Ole ONEIDA, MD;  Location: [facility] ENDOSCOPY;  Service: Endoscopy;  Laterality: N/A;   COLONOSCOPY WITH PROPOFOL  N/A 10/30/2021   Procedure: COLONOSCOPY WITH PROPOFOL ;  Surgeon: Maryruth Ole ONEIDA, MD;  Location: [facility] ENDOSCOPY;  Service: Endoscopy;  Laterality: N/A;   COLONOSCOPY WITH PROPOFOL  N/A 02/05/2022   Procedure: COLONOSCOPY WITH PROPOFOL ;  Surgeon: Maryruth Ole ONEIDA, MD;  Location: [facility] ENDOSCOPY;  Service: Endoscopy;  Laterality: N/A;   COLONOSCOPY WITH PROPOFOL  N/A 07/10/2023   Procedure: COLONOSCOPY WITH PROPOFOL ;  Surgeon: Onita Elspeth Sharper, DO;  Location: [facility] ENDOSCOPY;  Service: Gastroenterology;  Laterality: N/A;   ESOPHAGOGASTRODUODENOSCOPY (EGD) WITH PROPOFOL  N/A 07/10/2021   Procedure: ESOPHAGOGASTRODUODENOSCOPY (EGD) WITH PROPOFOL ;  Surgeon: Maryruth Ole ONEIDA, MD;  Location: [facility] ENDOSCOPY;  Service: Endoscopy;  Laterality: N/A;  IDDM   ESOPHAGOGASTRODUODENOSCOPY (EGD) WITH PROPOFOL  N/A 10/30/2021   Procedure: ESOPHAGOGASTRODUODENOSCOPY (EGD) WITH PROPOFOL ;  Surgeon: Maryruth Ole ONEIDA, MD;  Location: [facility] ENDOSCOPY;  Service: Endoscopy;  Laterality: N/A;  IDDM   EYE SURGERY Left 09/25/2017   cataract extraction   HYSTERECTOMY SUPRACERVICAL ABDOMINAL W/REMOVAL TUBES &/OR OVARIES  1995   KENALOG  INJECTION Right 10/16/2017   Procedure: KENALOG  INJECTION;  Surgeon: Myrna Adine Anes, MD;  Location: [facility] ORS;  Service: Ophthalmology;  Laterality: Right;   KNEE ARTHROCENTESIS Left  1981   KNEE ARTHROCENTESIS Bilateral 1992   right one in 1992   KNEE ARTHROCENTESIS Bilateral    POLYPECTOMY  07/10/2023   Procedure: POLYPECTOMY;  Surgeon: Onita Elspeth Sharper, DO;  Location: [facility] ENDOSCOPY;  Service: Gastroenterology;;   XI ROBOTIC LAPAROSCOPIC ASSISTED APPENDECTOMY N/A 03/18/2022   Procedure: XI ROBOTIC LAPAROSCOPIC ASSISTED APPENDECTOMY;  Surgeon: Rodolph Romano, MD;  Location: [facility] ORS;  Service: General;   Laterality: N/A;   Patient Active Problem List   Diagnosis Date Noted   Chronic knee pain (Bilateral) (R>L) 06/23/2024   Chronic hip pain (Right) 06/23/2024   Impaired range of motion of hip (Right) 06/23/2024   Chronic lower extremity pain (Right) 06/23/2024   Impaired range of motion of knee (Right) 06/23/2024   Abnormal MRI, lumbar spine [facility]) (03/05/2024) 06/23/2024   Lumbosacral radiculopathy at L5 (Right) 06/23/2024   Chronic radicular pain of lower extremity (Right) 06/23/2024   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, L4-5) 06/23/2024   Foraminal stenosis of lumbosacral region (L5-S1) (SEVERE  on Right) 06/23/2024   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) 06/23/2024   Lumbar facet arthropathy (Bilateral: L2-3, L3-4, L4-5, L5-S1) 06/23/2024   Lumbar facet joint pain (Bilateral) 06/23/2024   Low back pain of over 3 months duration 06/23/2024   Low back pain potentially associated with radiculopathy 06/23/2024   Low back pain radiating to leg (Right) 06/23/2024   Multifactorial low back pain 06/23/2024   Dextroscoliosis of lumbar spine (L3-4) 06/23/2024   DDD of lumbosacral region w/ LBP & LEP 06/23/2024   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) 06/23/2024   Osteoarthritis of lumbar spine 06/23/2024   Pharmacologic therapy 06/22/2024   Disorder of skeletal system 06/22/2024   Problems influencing health status 06/22/2024   Functional heart murmur 05/24/2024   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) 03/07/2024   Hypoxemic respiratory failure, chronic (HCC) 09/10/2022   Obstructive sleep apnea 05/29/2022   Chronic diastolic CHF (congestive heart failure), NYHA class 2 (HCC) 04/18/2022   Anemia    History of laparoscopic appendectomy    Screening for osteoporosis 03/12/2022   Polyp of colon 12/03/2021   Abnormal mammogram 09/21/2021   Encounter for screening for nutritional disorder 04/07/2020   Hearing loss 04/07/2020    Mixed hyperlipidemia 04/07/2020   Shoulder pain (Right) 04/07/2020   Elevated LFTs 09/28/2019   Insomnia due to medical condition 08/24/2019   Hypoxia 08/20/2019   Essential hypertension 08/20/2019   Depression 08/20/2019   Disease due to severe acute respiratory syndrome coronavirus 2 (SARS-CoV-2) 08/20/2019   Hyperlipidemia due to type 2 diabetes mellitus (HCC) 08/20/2019   Status post total hysterectomy 08/20/2019   Type 2 diabetes mellitus with diabetic polyneuropathy, with long-term current use of insulin  (HCC) 08/20/2019   Cough, unspecified 11/27/2018   Disorder associated with type 2 diabetes mellitus (HCC) 11/27/2018   Type 2 diabetes mellitus without complications (HCC) 01/06/2017   Major depressive disorder, recurrent episode, mild degree (HCC) 01/06/2017   Chronic pain syndrome 04/08/2016   Chronic low back pain (Bilateral) (R>L) w/ sciatica (Right) 04/08/2016   Peripheral neuropathy 02/15/2015   Arthritis 08/10/2012   Esophageal reflux 08/10/2012    PCP: Autry Grayce LABOR, PA   REFERRING PROVIDER: Rocco Organ Dipakkumar, MD  REFERRING DIAG: M51.369 (ICD-10-CM) - Other intervertebral disc degeneration, lumbar region without mention of lumbar back pain or lower extremity pain  Rationale for Evaluation and Treatment: Rehabilitation  THERAPY DIAG:  Other low back pain  Radiculopathy, lumbar region  Difficulty in walking, not elsewhere classified  ONSET DATE: December 2024  SUBJECTIVE:                                                                                                                                                                                           SUBJECTIVE STATEMENT: Back is a little bit better. Did her exercises this weekend and this morning. 4/10 low back pain currently. Does not hurt as bad.       PERTINENT HISTORY:  Low back pain. Currently wearing a back brace which is not helping. Pt states having a bulging disc. Pain  started December 2024, sudden onset, unknown mechanism of injury. Pt states having R Sciatic nerve in R posterior hip pain initially. Had x-ryas done, then and MRI which revealed bulging disc. Pain radiates along the R L5 dermatome as well as sciatic nerve (to superficial peroneal nerve distribution) to dorsal foot. R anterior ankle hurts. Pain has worsened since January 2025. Surgeon wants pt to do PT.      No latex allergies   Had L eye surgery 03/17/2024, pt states she is clear to participate in PT. 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  Blood pressure is controlled per pt.  DM is controlled per pt.  Has a CPAP machine.     PAIN:  Are you having pain? Yes: NPRS scale: 8/10 Pain location: Low back and R L5 dermatome and R sciatic nerve distribution to R dorsal foot.  Pain description: Low back: pressure; R LE: ache, tight Aggravating factors: Sleeping on her R side, standing and walking Relieving factors: leaning to the L, sleeping on her L side, leaning forward, wearing her back brace  PRECAUTIONS: Cannot see with L eye.     RED FLAGS: Bowel or bladder incontinence: No and Cauda equina syndrome: No   WEIGHT BEARING RESTRICTIONS: No  FALLS:  Has patient fallen in last 6 months? No  LIVING ENVIRONMENT: Lives with: lives with their spouse Lives in: House/apartment Stairs: Yes: Internal: 2 steps; none and External: 0 steps; none Has following equipment at home: Single point cane  OCCUPATION: Housewife  PLOF: Independent  PATIENT GOALS: decrease pain  NEXT MD VISIT: none at the moment  OBJECTIVE:  Note: Objective measures were completed at Evaluation unless otherwise noted.  DIAGNOSTIC FINDINGS:    PATIENT SURVEYS:  Modified Oswestry Low Back Pain Disability Questionnaire 62% (04/22/2024)  COGNITION: Overall cognitive status: Within functional limits for tasks assessed     SENSATION:   MUSCLE LENGTH:   POSTURE: forward neck, B protracted shoulders, L trunk side  bend, movement preference around L4/L5 area, L weight shifting.  PALPATION: TTP R low back, increased B lumbar paraspinal muscle tension R > L  TTP R L5 > L4 > L3 TTP R low back > L side     LUMBAR ROM:   AROM eval  Flexion WFL, R posterior hip pain when returning to the upright position  Extension Very limited with R posterior hip pain  Right lateral flexion Limited with R lateral hip joint pain (worse than L lateral flexion)  Left lateral flexion Limited with R lateral hip pain  Right rotation [facility] with R posterior lateral hip pain worse than L rotation  Left rotation WFL with R posterior lateral hip pain.    (Blank rows = not tested)  LOWER EXTREMITY ROM:     Passive  Right 05/31/2024 Left 05/31/2024  Hip flexion    Hip extension    Hip abduction    Hip adduction    Hip internal rotation 37 18  Hip external rotation 37 with R low back pain (different pain) 34  Knee flexion    Knee extension    Ankle dorsiflexion    Ankle plantarflexion    Ankle inversion    Ankle eversion     (Blank rows = not tested)  LOWER EXTREMITY MMT:    MMT Right eval Left eval  Hip flexion    Hip extension (seated manually resisted) 3+ 3+  Hip abduction (seated manually resisted) 4- 4-  Hip adduction    Hip internal rotation    Hip external rotation    Knee flexion 4 4  Knee extension 4+ 4+  Ankle dorsiflexion    Ankle plantarflexion    Ankle inversion    Ankle eversion     (Blank rows = not tested)  LUMBAR SPECIAL TESTS:    FUNCTIONAL TESTS:    GAIT: Distance walked: 30 ft Assistive device utilized: None Level of assistance: SBA Comments: Decreased stance R LE, antalgic with R trunk side bend during R LE stance phase, decreased B knee extension during stance phase, decreased foot clearance and gait velocity.   TREATMENT DATE: 06/28/2024                                                                                                                                 Neuromuscular Re-education  Performed to promote more neutral lumbopelvic posture and decrease stress to low back and LE nerves.   Sitting with upright posture in neutral   Manually resisted R trunk side bend isometrics (to decrease L trunk side bend posture) 10x5 seconds R posterior leg symptoms, decreases with rest.    Seated trunk flexion gray physioball roll outs To the L 10x5 seconds for 2 sets   Then straight forward 10x5 seconds   Seated B shoulder extension isometrics with hands on knees 10x5 seconds for 2 sets   Forward step up onto 4 inch step with R LE  With B shoulder extension red band 2x. R leg discomfort  Standing B shoulder extension red band 2x. R leg discomfort.    Standing with B UE assist Forward step up onto first regular step   With R LE to promote R glute strengthening. 10x2  Static lunge with contralateral UE assist  R 10x  L 10x. L knee grinding free ROM   Then with simulating vacuuming   R LE 10x2    Mini lunges which helps with R LE pain, but has R anterior ankle and foot pain suggesting possible R ankle DF ROM limitation.     Improved exercise technique, movement at target joints, use of target muscles after mod verbal, visual, tactile cues.     PATIENT EDUCATION:  Education details: there-ex, HEP, POC Person educated: Patient Education method: Explanation, Demonstration, Tactile cues, Verbal cues, and Handouts Education comprehension: verbalized understanding and returned demonstration  HOME EXERCISE PROGRAM: Access Code: [email or web address] URL: [email or web address] Date: 04/22/2024 Prepared by: Emil Glassman  Exercises - Seated Transversus Abdominis Bracing  - 3 x daily - 7 x weekly - 3 sets - 10 reps - 5 seconds hold  Upgraded to standing on (06/15/2024) - Seated Gluteal Sets  - 3 x daily - 7 x weekly - 3 sets - 10 reps - 5 seconds hold  Upgraded to standing on (06/15/2024)  Reclined, hooklying hip extension isometrics with  leg straight  R 10x5 seconds for 3 sets daily.  Drawing provided.      - Supine Transversus Abdominis Bracing - Hands on Stomach  - 5 x daily - 7 x weekly - 3 sets - 10 reps - 5 seconds hold - Supine Posterior Pelvic Tilt  - 2 x daily - 7 x weekly - 3 sets - 5 reps - 15 seconds hold  - Bent Knee Fallouts with Alternating Legs  - 1 x daily - 7 x weekly - 3 sets - 5 reps  - Supine Piriformis Stretch with Towel  - 3 x daily - 7 x weekly - 1 sets - 5 reps - 30 seconds hold   Sitting with lumbar towel roll 2 min  - Seated Flexion Stretch  - 3 x daily - 7 x weekly - 1 sets - 5 reps - 30 seconds hold  - Standing Gluteal Sets  - 1 x daily - 7 x weekly - 3 sets - 10 reps - 10 seconds hold - Standing Transverse Abdominis Contraction  - 1 x daily - 7 x weekly - 3 sets - 10 reps - 10 seconds hold  - Forward Step Up with Counter Support  - 1 x daily - 7 x weekly - 3 sets - 10 reps   Seated B shoulder extension isometrics with hands on knees 5x5 seconds ASSESSMENT:  CLINICAL IMPRESSION: Decreased starting low back pain today based on subjective reports. Continued working on trunk and glute strengthening to decrease low back pain during standing tasks. Worked on mini static lunges with R LE to promote ability to vacuum with less back pain. R anterior ankle and foot pain reported suggesting limited R ankle DF ROM. Pt tolerated session well without aggravation of symptoms. Pt will benefit from continued skilled physical therapy services to decrease pain, improve strength and function.     OBJECTIVE IMPAIRMENTS: Abnormal gait, decreased balance, difficulty walking, decreased strength, improper body mechanics, postural dysfunction, and pain.   ACTIVITY LIMITATIONS: carrying, lifting, bending, sitting, standing, squatting, sleeping, stairs, transfers, bed mobility, dressing, hygiene/grooming, and locomotion level  PARTICIPATION LIMITATIONS:   PERSONAL FACTORS: Age, Fitness, Time since onset  of  injury/illness/exacerbation, and 3+ comorbidities: anxiety, arthritis, depression, DM, HTN, sleep apnea are also affecting patient's functional outcome.   REHAB POTENTIAL: Fair    CLINICAL DECISION MAKING: Stable/uncomplicated Pain has worsened since onset per pt.   EVALUATION COMPLEXITY: Low   GOALS: Goals reviewed with patient? Yes  SHORT TERM GOALS: Target date: 05/07/2024  Pt will be independent with her initial HEP to decrease pain, improve strength, function, and ability to perform standing tasks more comfortably.  Baseline: Pt has started her initial HEP (04/22/2024); doing her HEP, no questions (06/02/2024) Goal status: MET   LONG TERM GOALS: Target date: 07/16/2024  Pt will have a decrease in low back pain to 4/10 or less at worst to promote ability to perform standing tasks more comfortably.  Baseline:8/10 low back pain at worst for the past 3 months (04/22/2024); 5/10 back pain at most for the past 7 days, also not currently wearing her back brace (06/02/2024), 5/10 at worst but when she vacuums, back pain goes up to a 6/10. Currently no longer using her back brace (06/07/2024); 6/10 at worst for the past 7 days (06/15/2024) Goal status: PROGRESSING  2.  Pt will have a decrease in R LE pain to 4/10 or less at worst to promote ability to ambulate and perform standing tasks more comfortably.  Baseline: 9/10 R LE pain at worst for the past 3 months. (04/22/2024); 5/10 R LE pain at most for the past 7 days, does not hurt often like it used to (06/02/2024), 5/10 at most but when she vacuums, R LE pain goes up to a 6/10. Currently no longer using her back brace (06/07/2024) Goal status: PROGRESSING  3.  Pt will improve her Modified Oswestry Low Back Pain Disability Questionnaire by at least 20% as a demonstration of improved function.  Baseline: Modified Oswestry Low Back Pain Disability Questionnaire 62% (04/22/2024); 46 % (06/07/2024) Goal status: PROGRESSING  4.  Pt will improve her B hip  extension and abduction strength by at least 1/2 MMT grade to promote ability to ambulate and perform standing tasks more comfortably.  Baseline:  MMT Right eval Left eval R (06/07/2024) L (06/07/2024)  Hip extension (seated manually resisted) 3+ 3+ 4+ 4  Hip abduction (seated manually resisted) 4- 4- 4+ 4+   (04/22/2024)  Goal status: MET   PLAN:  PT FREQUENCY: 1-2x/week  PT DURATION: 4 weeks  PLANNED INTERVENTIONS: 97110-Therapeutic exercises, 97530- Therapeutic activity, 97112- Neuromuscular re-education, 97535- Self Care, [id number]- Manual therapy, [id number]- Gait training, [phone]- Aquatic Therapy, [phone]- Electrical stimulation (unattended), [phone]- Traction (mechanical), [id number]- Ionotophoresis 4mg /ml Dexamethasone , Patient/Family education, Balance training, Stair training, Dry Needling, Joint mobilization, and Spinal mobilization.  PLAN FOR NEXT SESSION: Posture, trunk and glute strengthening, manual techniques, modalities PRN    Lainy Wrobleski, PT, DPT 06/28/2024, 12:58 PM

## 2024-06-30 ENCOUNTER — Ambulatory Visit

## 2024-06-30 DIAGNOSIS — R262 Difficulty in walking, not elsewhere classified: Secondary | ICD-10-CM

## 2024-06-30 DIAGNOSIS — M5459 Other low back pain: Secondary | ICD-10-CM | POA: Diagnosis not present

## 2024-06-30 DIAGNOSIS — M5416 Radiculopathy, lumbar region: Secondary | ICD-10-CM

## 2024-06-30 NOTE — Therapy (Signed)
 OUTPATIENT PHYSICAL THERAPY TREATMENT   Patient Name: Emma White MRN: [id number] DOB:December 16, 1956, 68 y.o., female Today's Date: 06/30/2024  END OF SESSION:  PT End of Session - 06/30/24 1121     Visit Number 17    Number of Visits 25    Date for PT Re-Evaluation 07/16/24    PT Start Time 1121    PT Stop Time 1202    PT Time Calculation (min) 41 min    Activity Tolerance Patient tolerated treatment well    Behavior During Therapy [facility] for tasks assessed/performed                          Past Medical History:  Diagnosis Date   Anxiety    Arthritis    hand   Complication of anesthesia 03/18/2022   Hypoxia in pacu. See pacu note 03/18/2022. The hospitalist was consulted for further work-up.   Depression    Diabetes mellitus without complication (HCC)    Dyspnea    DOE   Hyperlipemia    Hypertension    Neuropathy    Sleep apnea    Past Surgical History:  Procedure Laterality Date   ABDOMINAL HYSTERECTOMY  1995   APPENDECTOMY     BIOPSY  07/10/2023   Procedure: BIOPSY;  Surgeon: Onita Elspeth Sharper, DO;  Location: [facility] ENDOSCOPY;  Service: Gastroenterology;;   CATARACT EXTRACTION W/PHACO Left 09/25/2017   Procedure: CATARACT EXTRACTION PHACO AND INTRAOCULAR LENS PLACEMENT (IOC);  Surgeon: Myrna Adine Anes, MD;  Location: [facility] ORS;  Service: Ophthalmology;  Laterality: Left;   flui pack lot # [id number] H  US  00:32.7 AP%   12.17 CDE   3.95   CATARACT EXTRACTION W/PHACO Right 10/16/2017   Procedure: CATARACT EXTRACTION PHACO AND INTRAOCULAR LENS PLACEMENT (IOC);  Surgeon: Myrna Adine Anes, MD;  Location: [facility] ORS;  Service: Ophthalmology;  Laterality: Right;  US  00:32.8 AP% 9.0 CDE 2.96 FLUID PACK LOT # [id number]   COLONOSCOPY WITH PROPOFOL  N/A 05/27/2016   Procedure: COLONOSCOPY WITH PROPOFOL ;  Surgeon: Lamar ONEIDA Holmes, MD;  Location: [facility] ENDOSCOPY;  Service: Endoscopy;  Laterality: N/A;   COLONOSCOPY WITH PROPOFOL  N/A 07/10/2021   Procedure:  COLONOSCOPY WITH PROPOFOL ;  Surgeon: Maryruth Ole ONEIDA, MD;  Location: [facility] ENDOSCOPY;  Service: Endoscopy;  Laterality: N/A;   COLONOSCOPY WITH PROPOFOL  N/A 10/30/2021   Procedure: COLONOSCOPY WITH PROPOFOL ;  Surgeon: Maryruth Ole ONEIDA, MD;  Location: [facility] ENDOSCOPY;  Service: Endoscopy;  Laterality: N/A;   COLONOSCOPY WITH PROPOFOL  N/A 02/05/2022   Procedure: COLONOSCOPY WITH PROPOFOL ;  Surgeon: Maryruth Ole ONEIDA, MD;  Location: [facility] ENDOSCOPY;  Service: Endoscopy;  Laterality: N/A;   COLONOSCOPY WITH PROPOFOL  N/A 07/10/2023   Procedure: COLONOSCOPY WITH PROPOFOL ;  Surgeon: Onita Elspeth Sharper, DO;  Location: [facility] ENDOSCOPY;  Service: Gastroenterology;  Laterality: N/A;   ESOPHAGOGASTRODUODENOSCOPY (EGD) WITH PROPOFOL  N/A 07/10/2021   Procedure: ESOPHAGOGASTRODUODENOSCOPY (EGD) WITH PROPOFOL ;  Surgeon: Maryruth Ole ONEIDA, MD;  Location: [facility] ENDOSCOPY;  Service: Endoscopy;  Laterality: N/A;  IDDM   ESOPHAGOGASTRODUODENOSCOPY (EGD) WITH PROPOFOL  N/A 10/30/2021   Procedure: ESOPHAGOGASTRODUODENOSCOPY (EGD) WITH PROPOFOL ;  Surgeon: Maryruth Ole ONEIDA, MD;  Location: [facility] ENDOSCOPY;  Service: Endoscopy;  Laterality: N/A;  IDDM   EYE SURGERY Left 09/25/2017   cataract extraction   HYSTERECTOMY SUPRACERVICAL ABDOMINAL W/REMOVAL TUBES &/OR OVARIES  1995   KENALOG  INJECTION Right 10/16/2017   Procedure: KENALOG  INJECTION;  Surgeon: Myrna Adine Anes, MD;  Location: [facility] ORS;  Service: Ophthalmology;  Laterality: Right;   KNEE ARTHROCENTESIS  Left 1981   KNEE ARTHROCENTESIS Bilateral 1992   right one in 1992   KNEE ARTHROCENTESIS Bilateral    POLYPECTOMY  07/10/2023   Procedure: POLYPECTOMY;  Surgeon: Onita Elspeth Sharper, DO;  Location: [facility] ENDOSCOPY;  Service: Gastroenterology;;   XI ROBOTIC LAPAROSCOPIC ASSISTED APPENDECTOMY N/A 03/18/2022   Procedure: XI ROBOTIC LAPAROSCOPIC ASSISTED APPENDECTOMY;  Surgeon: Rodolph Romano, MD;  Location: [facility] ORS;  Service: General;   Laterality: N/A;   Patient Active Problem List   Diagnosis Date Noted   Chronic knee pain (Bilateral) (R>L) 06/23/2024   Chronic hip pain (Right) 06/23/2024   Impaired range of motion of hip (Right) 06/23/2024   Chronic lower extremity pain (Right) 06/23/2024   Impaired range of motion of knee (Right) 06/23/2024   Abnormal MRI, lumbar spine [facility]) (03/05/2024) 06/23/2024   Lumbosacral radiculopathy at L5 (Right) 06/23/2024   Chronic radicular pain of lower extremity (Right) 06/23/2024   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, L4-5) 06/23/2024   Foraminal stenosis of lumbosacral region (L5-S1) (SEVERE  on Right) 06/23/2024   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) 06/23/2024   Lumbar facet arthropathy (Bilateral: L2-3, L3-4, L4-5, L5-S1) 06/23/2024   Lumbar facet joint pain (Bilateral) 06/23/2024   Low back pain of over 3 months duration 06/23/2024   Low back pain potentially associated with radiculopathy 06/23/2024   Low back pain radiating to leg (Right) 06/23/2024   Multifactorial low back pain 06/23/2024   Dextroscoliosis of lumbar spine (L3-4) 06/23/2024   DDD of lumbosacral region w/ LBP & LEP 06/23/2024   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) 06/23/2024   Osteoarthritis of lumbar spine 06/23/2024   Pharmacologic therapy 06/22/2024   Disorder of skeletal system 06/22/2024   Problems influencing health status 06/22/2024   Functional heart murmur 05/24/2024   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) 03/07/2024   Hypoxemic respiratory failure, chronic (HCC) 09/10/2022   Obstructive sleep apnea 05/29/2022   Chronic diastolic CHF (congestive heart failure), NYHA class 2 (HCC) 04/18/2022   Anemia    History of laparoscopic appendectomy    Screening for osteoporosis 03/12/2022   Polyp of colon 12/03/2021   Abnormal mammogram 09/21/2021   Encounter for screening for nutritional disorder 04/07/2020   Hearing loss 04/07/2020    Mixed hyperlipidemia 04/07/2020   Shoulder pain (Right) 04/07/2020   Elevated LFTs 09/28/2019   Insomnia due to medical condition 08/24/2019   Hypoxia 08/20/2019   Essential hypertension 08/20/2019   Depression 08/20/2019   Disease due to severe acute respiratory syndrome coronavirus 2 (SARS-CoV-2) 08/20/2019   Hyperlipidemia due to type 2 diabetes mellitus (HCC) 08/20/2019   Status post total hysterectomy 08/20/2019   Type 2 diabetes mellitus with diabetic polyneuropathy, with long-term current use of insulin  (HCC) 08/20/2019   Cough, unspecified 11/27/2018   Disorder associated with type 2 diabetes mellitus (HCC) 11/27/2018   Type 2 diabetes mellitus without complications (HCC) 01/06/2017   Major depressive disorder, recurrent episode, mild degree (HCC) 01/06/2017   Chronic pain syndrome 04/08/2016   Chronic low back pain (Bilateral) (R>L) w/ sciatica (Right) 04/08/2016   Peripheral neuropathy 02/15/2015   Arthritis 08/10/2012   Esophageal reflux 08/10/2012    PCP: Autry Grayce LABOR, PA   REFERRING PROVIDER: Rocco Organ Dipakkumar, MD  REFERRING DIAG: M51.369 (ICD-10-CM) - Other intervertebral disc degeneration, lumbar region without mention of lumbar back pain or lower extremity pain  Rationale for Evaluation and Treatment: Rehabilitation  THERAPY DIAG:  Other low back pain  Radiculopathy, lumbar  region  Difficulty in walking, not elsewhere classified  ONSET DATE: December 2024  SUBJECTIVE:                                                                                                                                                                                           SUBJECTIVE STATEMENT: Back is a 4/10 currently at low back. Back was better after last session.         PERTINENT HISTORY:  Low back pain. Currently wearing a back brace which is not helping. Pt states having a bulging disc. Pain started December 2024, sudden onset, unknown mechanism of  injury. Pt states having R Sciatic nerve in R posterior hip pain initially. Had x-ryas done, then and MRI which revealed bulging disc. Pain radiates along the R L5 dermatome as well as sciatic nerve (to superficial peroneal nerve distribution) to dorsal foot. R anterior ankle hurts. Pain has worsened since January 2025. Surgeon wants pt to do PT.      No latex allergies   Had L eye surgery 03/17/2024, pt states she is clear to participate in PT. 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  Blood pressure is controlled per pt.  DM is controlled per pt.  Has a CPAP machine.     PAIN:  Are you having pain? Yes: NPRS scale: 8/10 Pain location: Low back and R L5 dermatome and R sciatic nerve distribution to R dorsal foot.  Pain description: Low back: pressure; R LE: ache, tight Aggravating factors: Sleeping on her R side, standing and walking Relieving factors: leaning to the L, sleeping on her L side, leaning forward, wearing her back brace  PRECAUTIONS: Cannot see with L eye.     RED FLAGS: Bowel or bladder incontinence: No and Cauda equina syndrome: No   WEIGHT BEARING RESTRICTIONS: No  FALLS:  Has patient fallen in last 6 months? No  LIVING ENVIRONMENT: Lives with: lives with their spouse Lives in: House/apartment Stairs: Yes: Internal: 2 steps; none and External: 0 steps; none Has following equipment at home: Single point cane  OCCUPATION: Housewife  PLOF: Independent  PATIENT GOALS: decrease pain  NEXT MD VISIT: none at the moment  OBJECTIVE:  Note: Objective measures were completed at Evaluation unless otherwise noted.  DIAGNOSTIC FINDINGS:    PATIENT SURVEYS:  Modified Oswestry Low Back Pain Disability Questionnaire 62% (04/22/2024)  COGNITION: Overall cognitive status: Within functional limits for tasks assessed     SENSATION:   MUSCLE LENGTH:   POSTURE: forward neck, B protracted shoulders, L trunk side bend, movement preference around L4/L5 area, L weight  shifting.   PALPATION: TTP R low  back, increased B lumbar paraspinal muscle tension R > L  TTP R L5 > L4 > L3 TTP R low back > L side     LUMBAR ROM:   AROM eval  Flexion WFL, R posterior hip pain when returning to the upright position  Extension Very limited with R posterior hip pain  Right lateral flexion Limited with R lateral hip joint pain (worse than L lateral flexion)  Left lateral flexion Limited with R lateral hip pain  Right rotation [facility] with R posterior lateral hip pain worse than L rotation  Left rotation WFL with R posterior lateral hip pain.    (Blank rows = not tested)  LOWER EXTREMITY ROM:     Passive  Right 05/31/2024 Left 05/31/2024  Hip flexion    Hip extension    Hip abduction    Hip adduction    Hip internal rotation 37 18  Hip external rotation 37 with R low back pain (different pain) 34  Knee flexion    Knee extension    Ankle dorsiflexion    Ankle plantarflexion    Ankle inversion    Ankle eversion     (Blank rows = not tested)  LOWER EXTREMITY MMT:    MMT Right eval Left eval  Hip flexion    Hip extension (seated manually resisted) 3+ 3+  Hip abduction (seated manually resisted) 4- 4-  Hip adduction    Hip internal rotation    Hip external rotation    Knee flexion 4 4  Knee extension 4+ 4+  Ankle dorsiflexion    Ankle plantarflexion    Ankle inversion    Ankle eversion     (Blank rows = not tested)  LUMBAR SPECIAL TESTS:    FUNCTIONAL TESTS:    GAIT: Distance walked: 30 ft Assistive device utilized: None Level of assistance: SBA Comments: Decreased stance R LE, antalgic with R trunk side bend during R LE stance phase, decreased B knee extension during stance phase, decreased foot clearance and gait velocity.   TREATMENT DATE: 06/30/2024                                                                                                                                Neuromuscular Re-education  Performed to promote more  neutral lumbopelvic posture and decrease stress to low back and LE nerves.   Sitting with upright posture in neutral    Manually resisted R upper trunk rotation isometrics to decreased R rotation of low back posture. 10x2 for 5 second holds  Decreased back pain reported   Manually resisted R trunk side bend isometrics (to decrease L trunk side bend posture) 10x5 seconds for 2 sets  Seated B shoulder extension isometrics with hands on knees 10x5 seconds for 2 sets  Standing B ankle DF stretch at stair step with B UE assist   1 min x 3  Static lunge with contralateral UE assist  R 10x  Then with simulating vacuuming   R LE 10x2    R posterior lateral hip discomfort.   Seated trunk flexion gray physioball roll outs straight forward 10x5 seconds for 2 sets  Mini bridge 5x  No back pain, good glute muscle work    Improved exercise technique, movement at target joints, use of target muscles after mod verbal, visual, tactile cues.     PATIENT EDUCATION:  Education details: there-ex, HEP, POC Person educated: Patient Education method: Explanation, Demonstration, Tactile cues, Verbal cues, and Handouts Education comprehension: verbalized understanding and returned demonstration  HOME EXERCISE PROGRAM: Access Code: [email or web address] URL: [email or web address] Date: 04/22/2024 Prepared by: Emil Glassman  Exercises - Seated Transversus Abdominis Bracing  - 3 x daily - 7 x weekly - 3 sets - 10 reps - 5 seconds hold  Upgraded to standing on (06/15/2024) - Seated Gluteal Sets  - 3 x daily - 7 x weekly - 3 sets - 10 reps - 5 seconds hold  Upgraded to standing on (06/15/2024)  Reclined, hooklying hip extension isometrics with leg straight  R 10x5 seconds for 3 sets daily.  Drawing provided.      - Supine Transversus Abdominis Bracing - Hands on Stomach  - 5 x daily - 7 x weekly - 3 sets - 10 reps - 5 seconds hold - Supine Posterior Pelvic Tilt  - 2 x daily - 7 x weekly - 3  sets - 5 reps - 15 seconds hold  - Bent Knee Fallouts with Alternating Legs  - 1 x daily - 7 x weekly - 3 sets - 5 reps  - Supine Piriformis Stretch with Towel  - 3 x daily - 7 x weekly - 1 sets - 5 reps - 30 seconds hold   Sitting with lumbar towel roll 2 min  - Seated Flexion Stretch  - 3 x daily - 7 x weekly - 1 sets - 5 reps - 30 seconds hold  - Standing Gluteal Sets  - 1 x daily - 7 x weekly - 3 sets - 10 reps - 10 seconds hold - Standing Transverse Abdominis Contraction  - 1 x daily - 7 x weekly - 3 sets - 10 reps - 10 seconds hold  - Forward Step Up with Counter Support  - 1 x daily - 7 x weekly - 3 sets - 10 reps   Seated B shoulder extension isometrics with hands on knees 5x5 seconds  - Supine Bridge  - 1 x daily - 7 x weekly - 2-3 sets - 5 reps    ASSESSMENT:  CLINICAL IMPRESSION: Continued working on improving posture, trunk and glute strength to decrease excess stress to her low back in standing. Improved back pain reported after trunk exercises and glute max strengthening (bridge) with reports of no low back and LE pain during gait at end of session. Pt will benefit from continued skilled physical therapy services to decrease pain, improve strength and function.     OBJECTIVE IMPAIRMENTS: Abnormal gait, decreased balance, difficulty walking, decreased strength, improper body mechanics, postural dysfunction, and pain.   ACTIVITY LIMITATIONS: carrying, lifting, bending, sitting, standing, squatting, sleeping, stairs, transfers, bed mobility, dressing, hygiene/grooming, and locomotion level  PARTICIPATION LIMITATIONS:   PERSONAL FACTORS: Age, Fitness, Time since onset of injury/illness/exacerbation, and 3+ comorbidities: anxiety, arthritis, depression, DM, HTN, sleep apnea are also affecting patient's functional outcome.   REHAB POTENTIAL: Fair    CLINICAL DECISION MAKING: Stable/uncomplicated Pain has worsened since onset per pt.   EVALUATION COMPLEXITY:  Low   GOALS: Goals reviewed with patient? Yes  SHORT TERM GOALS: Target date: 05/07/2024  Pt will be independent with her initial HEP to decrease pain, improve strength, function, and ability to perform standing tasks more comfortably.  Baseline: Pt has started her initial HEP (04/22/2024); doing her HEP, no questions (06/02/2024) Goal status: MET   LONG TERM GOALS: Target date: 07/16/2024  Pt will have a decrease in low back pain to 4/10 or less at worst to promote ability to perform standing tasks more comfortably.  Baseline:8/10 low back pain at worst for the past 3 months (04/22/2024); 5/10 back pain at most for the past 7 days, also not currently wearing her back brace (06/02/2024), 5/10 at worst but when she vacuums, back pain goes up to a 6/10. Currently no longer using her back brace (06/07/2024); 6/10 at worst for the past 7 days (06/15/2024) Goal status: PROGRESSING  2.  Pt will have a decrease in R LE pain to 4/10 or less at worst to promote ability to ambulate and perform standing tasks more comfortably.  Baseline: 9/10 R LE pain at worst for the past 3 months. (04/22/2024); 5/10 R LE pain at most for the past 7 days, does not hurt often like it used to (06/02/2024), 5/10 at most but when she vacuums, R LE pain goes up to a 6/10. Currently no longer using her back brace (06/07/2024) Goal status: PROGRESSING  3.  Pt will improve her Modified Oswestry Low Back Pain Disability Questionnaire by at least 20% as a demonstration of improved function.  Baseline: Modified Oswestry Low Back Pain Disability Questionnaire 62% (04/22/2024); 46 % (06/07/2024) Goal status: PROGRESSING  4.  Pt will improve her B hip extension and abduction strength by at least 1/2 MMT grade to promote ability to ambulate and perform standing tasks more comfortably.  Baseline:  MMT Right eval Left eval R (06/07/2024) L (06/07/2024)  Hip extension (seated manually resisted) 3+ 3+ 4+ 4  Hip abduction (seated manually  resisted) 4- 4- 4+ 4+   (04/22/2024)  Goal status: MET   PLAN:  PT FREQUENCY: 1-2x/week  PT DURATION: 4 weeks  PLANNED INTERVENTIONS: 97110-Therapeutic exercises, 97530- Therapeutic activity, 97112- Neuromuscular re-education, 97535- Self Care, [id number]- Manual therapy, [id number]- Gait training, [phone]- Aquatic Therapy, [phone]- Electrical stimulation (unattended), [phone]- Traction (mechanical), [id number]- Ionotophoresis 4mg /ml Dexamethasone , Patient/Family education, Balance training, Stair training, Dry Needling, Joint mobilization, and Spinal mobilization.  PLAN FOR NEXT SESSION: Posture, trunk and glute strengthening, manual techniques, modalities PRN    Elleana Stillson, PT, DPT 06/30/2024, 12:21 PM

## 2024-07-05 ENCOUNTER — Ambulatory Visit

## 2024-07-05 DIAGNOSIS — M5416 Radiculopathy, lumbar region: Secondary | ICD-10-CM

## 2024-07-05 DIAGNOSIS — M5459 Other low back pain: Secondary | ICD-10-CM

## 2024-07-05 DIAGNOSIS — R262 Difficulty in walking, not elsewhere classified: Secondary | ICD-10-CM

## 2024-07-05 NOTE — Therapy (Signed)
 OUTPATIENT PHYSICAL THERAPY TREATMENT   Patient Name: Emma White MRN: [id number] DOB:June 15, 1956, 68 y.o., female Today's Date: 07/05/2024  END OF SESSION:  PT End of Session - 07/05/24 1119     Visit Number 18    Number of Visits 25    Date for PT Re-Evaluation 07/16/24    PT Start Time 1119    PT Stop Time 1203    PT Time Calculation (min) 44 min    Activity Tolerance Patient tolerated treatment well    Behavior During Therapy [facility] for tasks assessed/performed                           Past Medical History:  Diagnosis Date   Anxiety    Arthritis    hand   Complication of anesthesia 03/18/2022   Hypoxia in pacu. See pacu note 03/18/2022. The hospitalist was consulted for further work-up.   Depression    Diabetes mellitus without complication (HCC)    Dyspnea    DOE   Hyperlipemia    Hypertension    Neuropathy    Sleep apnea    Past Surgical History:  Procedure Laterality Date   ABDOMINAL HYSTERECTOMY  1995   APPENDECTOMY     BIOPSY  07/10/2023   Procedure: BIOPSY;  Surgeon: Onita Elspeth Sharper, DO;  Location: [facility] ENDOSCOPY;  Service: Gastroenterology;;   CATARACT EXTRACTION W/PHACO Left 09/25/2017   Procedure: CATARACT EXTRACTION PHACO AND INTRAOCULAR LENS PLACEMENT (IOC);  Surgeon: Myrna Adine Anes, MD;  Location: [facility] ORS;  Service: Ophthalmology;  Laterality: Left;   flui pack lot # [id number]  US  00:32.7 AP%   12.17 CDE   3.95   CATARACT EXTRACTION W/PHACO Right 10/16/2017   Procedure: CATARACT EXTRACTION PHACO AND INTRAOCULAR LENS PLACEMENT (IOC);  Surgeon: Myrna Adine Anes, MD;  Location: [facility] ORS;  Service: Ophthalmology;  Laterality: Right;  US  00:32.8 AP% 9.0 CDE 2.96 FLUID PACK LOT # [id number]   COLONOSCOPY WITH PROPOFOL  N/A 05/27/2016   Procedure: COLONOSCOPY WITH PROPOFOL ;  Surgeon: Lamar ONEIDA Holmes, MD;  Location: [facility] ENDOSCOPY;  Service: Endoscopy;  Laterality: N/A;   COLONOSCOPY WITH PROPOFOL  N/A 07/10/2021   Procedure:  COLONOSCOPY WITH PROPOFOL ;  Surgeon: Maryruth Ole ONEIDA, MD;  Location: [facility] ENDOSCOPY;  Service: Endoscopy;  Laterality: N/A;   COLONOSCOPY WITH PROPOFOL  N/A 10/30/2021   Procedure: COLONOSCOPY WITH PROPOFOL ;  Surgeon: Maryruth Ole ONEIDA, MD;  Location: [facility] ENDOSCOPY;  Service: Endoscopy;  Laterality: N/A;   COLONOSCOPY WITH PROPOFOL  N/A 02/05/2022   Procedure: COLONOSCOPY WITH PROPOFOL ;  Surgeon: Maryruth Ole ONEIDA, MD;  Location: [facility] ENDOSCOPY;  Service: Endoscopy;  Laterality: N/A;   COLONOSCOPY WITH PROPOFOL  N/A 07/10/2023   Procedure: COLONOSCOPY WITH PROPOFOL ;  Surgeon: Onita Elspeth Sharper, DO;  Location: [facility] ENDOSCOPY;  Service: Gastroenterology;  Laterality: N/A;   ESOPHAGOGASTRODUODENOSCOPY (EGD) WITH PROPOFOL  N/A 07/10/2021   Procedure: ESOPHAGOGASTRODUODENOSCOPY (EGD) WITH PROPOFOL ;  Surgeon: Maryruth Ole ONEIDA, MD;  Location: [facility] ENDOSCOPY;  Service: Endoscopy;  Laterality: N/A;  IDDM   ESOPHAGOGASTRODUODENOSCOPY (EGD) WITH PROPOFOL  N/A 10/30/2021   Procedure: ESOPHAGOGASTRODUODENOSCOPY (EGD) WITH PROPOFOL ;  Surgeon: Maryruth Ole ONEIDA, MD;  Location: [facility] ENDOSCOPY;  Service: Endoscopy;  Laterality: N/A;  IDDM   EYE SURGERY Left 09/25/2017   cataract extraction   HYSTERECTOMY SUPRACERVICAL ABDOMINAL W/REMOVAL TUBES &/OR OVARIES  1995   KENALOG  INJECTION Right 10/16/2017   Procedure: KENALOG  INJECTION;  Surgeon: Myrna Adine Anes, MD;  Location: [facility] ORS;  Service: Ophthalmology;  Laterality: Right;   KNEE  ARTHROCENTESIS Left 1981   KNEE ARTHROCENTESIS Bilateral 1992   right one in 1992   KNEE ARTHROCENTESIS Bilateral    POLYPECTOMY  07/10/2023   Procedure: POLYPECTOMY;  Surgeon: Onita Elspeth Sharper, DO;  Location: [facility] ENDOSCOPY;  Service: Gastroenterology;;   XI ROBOTIC LAPAROSCOPIC ASSISTED APPENDECTOMY N/A 03/18/2022   Procedure: XI ROBOTIC LAPAROSCOPIC ASSISTED APPENDECTOMY;  Surgeon: Rodolph Romano, MD;  Location: [facility] ORS;  Service: General;   Laterality: N/A;   Patient Active Problem List   Diagnosis Date Noted   Chronic knee pain (Bilateral) (R>L) 06/23/2024   Chronic hip pain (Right) 06/23/2024   Impaired range of motion of hip (Right) 06/23/2024   Chronic lower extremity pain (Right) 06/23/2024   Impaired range of motion of knee (Right) 06/23/2024   Abnormal MRI, lumbar spine [facility]) (03/05/2024) 06/23/2024   Lumbosacral radiculopathy at L5 (Right) 06/23/2024   Chronic radicular pain of lower extremity (Right) 06/23/2024   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, L4-5) 06/23/2024   Foraminal stenosis of lumbosacral region (L5-S1) (SEVERE  on Right) 06/23/2024   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) 06/23/2024   Lumbar facet arthropathy (Bilateral: L2-3, L3-4, L4-5, L5-S1) 06/23/2024   Lumbar facet joint pain (Bilateral) 06/23/2024   Low back pain of over 3 months duration 06/23/2024   Low back pain potentially associated with radiculopathy 06/23/2024   Low back pain radiating to leg (Right) 06/23/2024   Multifactorial low back pain 06/23/2024   Dextroscoliosis of lumbar spine (L3-4) 06/23/2024   DDD of lumbosacral region w/ LBP & LEP 06/23/2024   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) 06/23/2024   Osteoarthritis of lumbar spine 06/23/2024   Pharmacologic therapy 06/22/2024   Disorder of skeletal system 06/22/2024   Problems influencing health status 06/22/2024   Functional heart murmur 05/24/2024   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) 03/07/2024   Hypoxemic respiratory failure, chronic (HCC) 09/10/2022   Obstructive sleep apnea 05/29/2022   Chronic diastolic CHF (congestive heart failure), NYHA class 2 (HCC) 04/18/2022   Anemia    History of laparoscopic appendectomy    Screening for osteoporosis 03/12/2022   Polyp of colon 12/03/2021   Abnormal mammogram 09/21/2021   Encounter for screening for nutritional disorder 04/07/2020   Hearing loss 04/07/2020    Mixed hyperlipidemia 04/07/2020   Shoulder pain (Right) 04/07/2020   Elevated LFTs 09/28/2019   Insomnia due to medical condition 08/24/2019   Hypoxia 08/20/2019   Essential hypertension 08/20/2019   Depression 08/20/2019   Disease due to severe acute respiratory syndrome coronavirus 2 (SARS-CoV-2) 08/20/2019   Hyperlipidemia due to type 2 diabetes mellitus (HCC) 08/20/2019   Status post total hysterectomy 08/20/2019   Type 2 diabetes mellitus with diabetic polyneuropathy, with long-term current use of insulin  (HCC) 08/20/2019   Cough, unspecified 11/27/2018   Disorder associated with type 2 diabetes mellitus (HCC) 11/27/2018   Type 2 diabetes mellitus without complications (HCC) 01/06/2017   Major depressive disorder, recurrent episode, mild degree (HCC) 01/06/2017   Chronic pain syndrome 04/08/2016   Chronic low back pain (Bilateral) (R>L) w/ sciatica (Right) 04/08/2016   Peripheral neuropathy 02/15/2015   Arthritis 08/10/2012   Esophageal reflux 08/10/2012    PCP: Autry Grayce LABOR, PA   REFERRING PROVIDER: Rocco Organ Dipakkumar, MD  REFERRING DIAG: M51.369 (ICD-10-CM) - Other intervertebral disc degeneration, lumbar region without mention of lumbar back pain or lower extremity pain  Rationale for Evaluation and Treatment: Rehabilitation  THERAPY DIAG:  Other low back pain  Radiculopathy,  lumbar region  Difficulty in walking, not elsewhere classified  ONSET DATE: December 2024  SUBJECTIVE:                                                                                                                                                                                           SUBJECTIVE STATEMENT: Back still hurting. 4.5/10 currently. R LE bothers her more (around the L5 dermatome) to her foot.         PERTINENT HISTORY:  Low back pain. Currently wearing a back brace which is not helping. Pt states having a bulging disc. Pain started December 2024, sudden  onset, unknown mechanism of injury. Pt states having R Sciatic nerve in R posterior hip pain initially. Had x-ryas done, then and MRI which revealed bulging disc. Pain radiates along the R L5 dermatome as well as sciatic nerve (to superficial peroneal nerve distribution) to dorsal foot. R anterior ankle hurts. Pain has worsened since January 2025. Surgeon wants pt to do PT.      No latex allergies   Had L eye surgery 03/17/2024, pt states she is clear to participate in PT. 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  Blood pressure is controlled per pt.  DM is controlled per pt.  Has a CPAP machine.     PAIN:  Are you having pain? Yes: NPRS scale: 8/10 Pain location: Low back and R L5 dermatome and R sciatic nerve distribution to R dorsal foot.  Pain description: Low back: pressure; R LE: ache, tight Aggravating factors: Sleeping on her R side, standing and walking Relieving factors: leaning to the L, sleeping on her L side, leaning forward, wearing her back brace  PRECAUTIONS: Cannot see with L eye.     RED FLAGS: Bowel or bladder incontinence: No and Cauda equina syndrome: No   WEIGHT BEARING RESTRICTIONS: No  FALLS:  Has patient fallen in last 6 months? No  LIVING ENVIRONMENT: Lives with: lives with their spouse Lives in: House/apartment Stairs: Yes: Internal: 2 steps; none and External: 0 steps; none Has following equipment at home: Single point cane  OCCUPATION: Housewife  PLOF: Independent  PATIENT GOALS: decrease pain  NEXT MD VISIT: none at the moment  OBJECTIVE:  Note: Objective measures were completed at Evaluation unless otherwise noted.  DIAGNOSTIC FINDINGS:    PATIENT SURVEYS:  Modified Oswestry Low Back Pain Disability Questionnaire 62% (04/22/2024)  COGNITION: Overall cognitive status: Within functional limits for tasks assessed     SENSATION:   MUSCLE LENGTH:   POSTURE: forward neck, B protracted shoulders, L trunk side bend, movement preference around  L4/L5 area, L weight shifting.  PALPATION: TTP R low back, increased B lumbar paraspinal muscle tension R > L  TTP R L5 > L4 > L3 TTP R low back > L side     LUMBAR ROM:   AROM eval  Flexion WFL, R posterior hip pain when returning to the upright position  Extension Very limited with R posterior hip pain  Right lateral flexion Limited with R lateral hip joint pain (worse than L lateral flexion)  Left lateral flexion Limited with R lateral hip pain  Right rotation [facility] with R posterior lateral hip pain worse than L rotation  Left rotation WFL with R posterior lateral hip pain.    (Blank rows = not tested)  LOWER EXTREMITY ROM:     Passive  Right 05/31/2024 Left 05/31/2024  Hip flexion    Hip extension    Hip abduction    Hip adduction    Hip internal rotation 37 18  Hip external rotation 37 with R low back pain (different pain) 34  Knee flexion    Knee extension    Ankle dorsiflexion    Ankle plantarflexion    Ankle inversion    Ankle eversion     (Blank rows = not tested)  LOWER EXTREMITY MMT:    MMT Right eval Left eval  Hip flexion    Hip extension (seated manually resisted) 3+ 3+  Hip abduction (seated manually resisted) 4- 4-  Hip adduction    Hip internal rotation    Hip external rotation    Knee flexion 4 4  Knee extension 4+ 4+  Ankle dorsiflexion    Ankle plantarflexion    Ankle inversion    Ankle eversion     (Blank rows = not tested)  LUMBAR SPECIAL TESTS:    FUNCTIONAL TESTS:    GAIT: Distance walked: 30 ft Assistive device utilized: None Level of assistance: SBA Comments: Decreased stance R LE, antalgic with R trunk side bend during R LE stance phase, decreased B knee extension during stance phase, decreased foot clearance and gait velocity.   TREATMENT DATE: 07/05/2024                                                                                                                                Neuromuscular Re-education  Performed  to promote more neutral lumbopelvic posture and decrease stress to low back and LE nerves.   Sitting with upright posture in neutral   Manually resisted R upper trunk rotation isometrics to decreased R rotation of low back posture. 10x3 for 5 second holds  Manually resisted trunk side bend isometrics  R 10x5 seconds for 2 sets L 10x5 seconds for 2 sets   Mini bridge 5x3  R low back discomfort, eases quickly with rest.   Hooklying  bent knee fallout 5x each side  Crunch 10x3  Clam shell green 10x3  Improved exercise technique, movement at target joints, use of target muscles after mod verbal, visual,  tactile cues.     PATIENT EDUCATION:  Education details: there-ex, HEP, POC Person educated: Patient Education method: Explanation, Demonstration, Tactile cues, Verbal cues, and Handouts Education comprehension: verbalized understanding and returned demonstration  HOME EXERCISE PROGRAM: Access Code: [email or web address] URL: [email or web address] Date: 04/22/2024 Prepared by: Emil Glassman  Exercises - Seated Transversus Abdominis Bracing  - 3 x daily - 7 x weekly - 3 sets - 10 reps - 5 seconds hold  Upgraded to standing on (06/15/2024) - Seated Gluteal Sets  - 3 x daily - 7 x weekly - 3 sets - 10 reps - 5 seconds hold  Upgraded to standing on (06/15/2024)  Reclined, hooklying hip extension isometrics with leg straight  R 10x5 seconds for 3 sets daily.  Drawing provided.      - Supine Transversus Abdominis Bracing - Hands on Stomach  - 5 x daily - 7 x weekly - 3 sets - 10 reps - 5 seconds hold - Supine Posterior Pelvic Tilt  - 2 x daily - 7 x weekly - 3 sets - 5 reps - 15 seconds hold  - Bent Knee Fallouts with Alternating Legs  - 1 x daily - 7 x weekly - 3 sets - 5 reps  - Supine Piriformis Stretch with Towel  - 3 x daily - 7 x weekly - 1 sets - 5 reps - 30 seconds hold   Sitting with lumbar towel roll 2 min  - Seated Flexion Stretch  - 3 x daily - 7 x weekly - 1 sets  - 5 reps - 30 seconds hold  - Standing Gluteal Sets  - 1 x daily - 7 x weekly - 3 sets - 10 reps - 10 seconds hold - Standing Transverse Abdominis Contraction  - 1 x daily - 7 x weekly - 3 sets - 10 reps - 10 seconds hold  - Forward Step Up with Counter Support  - 1 x daily - 7 x weekly - 3 sets - 10 reps   Seated B shoulder extension isometrics with hands on knees 5x5 seconds  - Supine Bridge  - 1 x daily - 7 x weekly - 2-3 sets - 5 reps  - Hooklying Clamshell with Resistance  - 1 x daily - 7 x weekly - 3 sets - 10 reps  Green band  ASSESSMENT:  CLINICAL IMPRESSION: Decreased low back worst pain level to 5/10. Continued working on trunk posture lumbopelvic strength and stability as well as glute med and max strength to decrease stress to low back and R LE when performing standing tasks. Fair tolerance to today's session.  Pt will benefit from continued skilled physical therapy services to decrease pain, improve strength and function.     OBJECTIVE IMPAIRMENTS: Abnormal gait, decreased balance, difficulty walking, decreased strength, improper body mechanics, postural dysfunction, and pain.   ACTIVITY LIMITATIONS: carrying, lifting, bending, sitting, standing, squatting, sleeping, stairs, transfers, bed mobility, dressing, hygiene/grooming, and locomotion level  PARTICIPATION LIMITATIONS:   PERSONAL FACTORS: Age, Fitness, Time since onset of injury/illness/exacerbation, and 3+ comorbidities: anxiety, arthritis, depression, DM, HTN, sleep apnea are also affecting patient's functional outcome.   REHAB POTENTIAL: Fair    CLINICAL DECISION MAKING: Stable/uncomplicated Pain has worsened since onset per pt.   EVALUATION COMPLEXITY: Low   GOALS: Goals reviewed with patient? Yes  SHORT TERM GOALS: Target date: 05/07/2024  Pt will be independent with her initial HEP to decrease pain, improve strength, function, and ability to perform standing tasks more comfortably.  Baseline: Pt has  started her initial HEP (04/22/2024); doing her HEP, no questions (06/02/2024) Goal status: MET   LONG TERM GOALS: Target date: 07/16/2024  Pt will have a decrease in low back pain to 4/10 or less at worst to promote ability to perform standing tasks more comfortably.  Baseline:8/10 low back pain at worst for the past 3 months (04/22/2024); 5/10 back pain at most for the past 7 days, also not currently wearing her back brace (06/02/2024), 5/10 at worst but when she vacuums, back pain goes up to a 6/10. Currently no longer using her back brace (06/07/2024); 6/10 at worst for the past 7 days (06/15/2024); 5/10 at most for the past 7 days (07/05/2024) Goal status: PROGRESSING  2.  Pt will have a decrease in R LE pain to 4/10 or less at worst to promote ability to ambulate and perform standing tasks more comfortably.  Baseline: 9/10 R LE pain at worst for the past 3 months. (04/22/2024); 5/10 R LE pain at most for the past 7 days, does not hurt often like it used to (06/02/2024), 5/10 at most but when she vacuums, R LE pain goes up to a 6/10. Currently no longer using her back brace (06/07/2024); 6/10 at most for the past 7 days. (07/05/2024) Goal status: PROGRESSING  3.  Pt will improve her Modified Oswestry Low Back Pain Disability Questionnaire by at least 20% as a demonstration of improved function.  Baseline: Modified Oswestry Low Back Pain Disability Questionnaire 62% (04/22/2024); 46 % (06/07/2024) Goal status: PROGRESSING  4.  Pt will improve her B hip extension and abduction strength by at least 1/2 MMT grade to promote ability to ambulate and perform standing tasks more comfortably.  Baseline:  MMT Right eval Left eval R (06/07/2024) L (06/07/2024)  Hip extension (seated manually resisted) 3+ 3+ 4+ 4  Hip abduction (seated manually resisted) 4- 4- 4+ 4+   (04/22/2024)  Goal status: MET   PLAN:  PT FREQUENCY: 1-2x/week  PT DURATION: 4 weeks  PLANNED INTERVENTIONS: 97110-Therapeutic exercises,  97530- Therapeutic activity, 97112- Neuromuscular re-education, 97535- Self Care, [id number]- Manual therapy, [id number]- Gait training, [phone]- Aquatic Therapy, [phone]- Electrical stimulation (unattended), [phone]- Traction (mechanical), [id number]- Ionotophoresis 4mg /ml Dexamethasone , Patient/Family education, Balance training, Stair training, Dry Needling, Joint mobilization, and Spinal mobilization.  PLAN FOR NEXT SESSION: Posture, trunk and glute strengthening, manual techniques, modalities PRN    Lacole Komorowski, PT, DPT 07/05/2024, 12:35 PM

## 2024-07-07 ENCOUNTER — Ambulatory Visit

## 2024-07-07 DIAGNOSIS — M5459 Other low back pain: Secondary | ICD-10-CM | POA: Diagnosis not present

## 2024-07-07 DIAGNOSIS — R262 Difficulty in walking, not elsewhere classified: Secondary | ICD-10-CM

## 2024-07-07 DIAGNOSIS — M5416 Radiculopathy, lumbar region: Secondary | ICD-10-CM

## 2024-07-07 NOTE — Therapy (Signed)
 OUTPATIENT PHYSICAL THERAPY TREATMENT   Patient Name: Emma White MRN: [id number] DOB:01/02/1956, 68 y.o., female Today's Date: 07/07/2024  END OF SESSION:  PT End of Session - 07/07/24 1120     Visit Number 19    Number of Visits 25    Date for PT Re-Evaluation 08/13/24    PT Start Time 1120    PT Stop Time 1201    PT Time Calculation (min) 41 min    Activity Tolerance Patient tolerated treatment well    Behavior During Therapy [facility] for tasks assessed/performed                            Past Medical History:  Diagnosis Date   Anxiety    Arthritis    hand   Complication of anesthesia 03/18/2022   Hypoxia in pacu. See pacu note 03/18/2022. The hospitalist was consulted for further work-up.   Depression    Diabetes mellitus without complication (HCC)    Dyspnea    DOE   Hyperlipemia    Hypertension    Neuropathy    Sleep apnea    Past Surgical History:  Procedure Laterality Date   ABDOMINAL HYSTERECTOMY  1995   APPENDECTOMY     BIOPSY  07/10/2023   Procedure: BIOPSY;  Surgeon: Onita Elspeth Sharper, DO;  Location: [facility] ENDOSCOPY;  Service: Gastroenterology;;   CATARACT EXTRACTION W/PHACO Left 09/25/2017   Procedure: CATARACT EXTRACTION PHACO AND INTRAOCULAR LENS PLACEMENT (IOC);  Surgeon: Myrna Adine Anes, MD;  Location: [facility] ORS;  Service: Ophthalmology;  Laterality: Left;   flui pack lot # [id number] H  US  00:32.7 AP%   12.17 CDE   3.95   CATARACT EXTRACTION W/PHACO Right 10/16/2017   Procedure: CATARACT EXTRACTION PHACO AND INTRAOCULAR LENS PLACEMENT (IOC);  Surgeon: Myrna Adine Anes, MD;  Location: [facility] ORS;  Service: Ophthalmology;  Laterality: Right;  US  00:32.8 AP% 9.0 CDE 2.96 FLUID PACK LOT # [id number]   COLONOSCOPY WITH PROPOFOL  N/A 05/27/2016   Procedure: COLONOSCOPY WITH PROPOFOL ;  Surgeon: Lamar ONEIDA Holmes, MD;  Location: [facility] ENDOSCOPY;  Service: Endoscopy;  Laterality: N/A;   COLONOSCOPY WITH PROPOFOL  N/A 07/10/2021    Procedure: COLONOSCOPY WITH PROPOFOL ;  Surgeon: Maryruth Ole ONEIDA, MD;  Location: [facility] ENDOSCOPY;  Service: Endoscopy;  Laterality: N/A;   COLONOSCOPY WITH PROPOFOL  N/A 10/30/2021   Procedure: COLONOSCOPY WITH PROPOFOL ;  Surgeon: Maryruth Ole ONEIDA, MD;  Location: [facility] ENDOSCOPY;  Service: Endoscopy;  Laterality: N/A;   COLONOSCOPY WITH PROPOFOL  N/A 02/05/2022   Procedure: COLONOSCOPY WITH PROPOFOL ;  Surgeon: Maryruth Ole ONEIDA, MD;  Location: [facility] ENDOSCOPY;  Service: Endoscopy;  Laterality: N/A;   COLONOSCOPY WITH PROPOFOL  N/A 07/10/2023   Procedure: COLONOSCOPY WITH PROPOFOL ;  Surgeon: Onita Elspeth Sharper, DO;  Location: [facility] ENDOSCOPY;  Service: Gastroenterology;  Laterality: N/A;   ESOPHAGOGASTRODUODENOSCOPY (EGD) WITH PROPOFOL  N/A 07/10/2021   Procedure: ESOPHAGOGASTRODUODENOSCOPY (EGD) WITH PROPOFOL ;  Surgeon: Maryruth Ole ONEIDA, MD;  Location: [facility] ENDOSCOPY;  Service: Endoscopy;  Laterality: N/A;  IDDM   ESOPHAGOGASTRODUODENOSCOPY (EGD) WITH PROPOFOL  N/A 10/30/2021   Procedure: ESOPHAGOGASTRODUODENOSCOPY (EGD) WITH PROPOFOL ;  Surgeon: Maryruth Ole ONEIDA, MD;  Location: [facility] ENDOSCOPY;  Service: Endoscopy;  Laterality: N/A;  IDDM   EYE SURGERY Left 09/25/2017   cataract extraction   HYSTERECTOMY SUPRACERVICAL ABDOMINAL W/REMOVAL TUBES &/OR OVARIES  1995   KENALOG  INJECTION Right 10/16/2017   Procedure: KENALOG  INJECTION;  Surgeon: Myrna Adine Anes, MD;  Location: [facility] ORS;  Service: Ophthalmology;  Laterality: Right;  KNEE ARTHROCENTESIS Left 1981   KNEE ARTHROCENTESIS Bilateral 1992   right one in 1992   KNEE ARTHROCENTESIS Bilateral    POLYPECTOMY  07/10/2023   Procedure: POLYPECTOMY;  Surgeon: Onita Elspeth Sharper, DO;  Location: [facility] ENDOSCOPY;  Service: Gastroenterology;;   XI ROBOTIC LAPAROSCOPIC ASSISTED APPENDECTOMY N/A 03/18/2022   Procedure: XI ROBOTIC LAPAROSCOPIC ASSISTED APPENDECTOMY;  Surgeon: Rodolph Romano, MD;  Location: [facility] ORS;  Service: General;   Laterality: N/A;   Patient Active Problem List   Diagnosis Date Noted   Chronic knee pain (Bilateral) (R>L) 06/23/2024   Chronic hip pain (Right) 06/23/2024   Impaired range of motion of hip (Right) 06/23/2024   Chronic lower extremity pain (Right) 06/23/2024   Impaired range of motion of knee (Right) 06/23/2024   Abnormal MRI, lumbar spine [facility]) (03/05/2024) 06/23/2024   Lumbosacral radiculopathy at L5 (Right) 06/23/2024   Chronic radicular pain of lower extremity (Right) 06/23/2024   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, L4-5) 06/23/2024   Foraminal stenosis of lumbosacral region (L5-S1) (SEVERE  on Right) 06/23/2024   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) 06/23/2024   Lumbar facet arthropathy (Bilateral: L2-3, L3-4, L4-5, L5-S1) 06/23/2024   Lumbar facet joint pain (Bilateral) 06/23/2024   Low back pain of over 3 months duration 06/23/2024   Low back pain potentially associated with radiculopathy 06/23/2024   Low back pain radiating to leg (Right) 06/23/2024   Multifactorial low back pain 06/23/2024   Dextroscoliosis of lumbar spine (L3-4) 06/23/2024   DDD of lumbosacral region w/ LBP & LEP 06/23/2024   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) 06/23/2024   Osteoarthritis of lumbar spine 06/23/2024   Pharmacologic therapy 06/22/2024   Disorder of skeletal system 06/22/2024   Problems influencing health status 06/22/2024   Functional heart murmur 05/24/2024   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) 03/07/2024   Hypoxemic respiratory failure, chronic (HCC) 09/10/2022   Obstructive sleep apnea 05/29/2022   Chronic diastolic CHF (congestive heart failure), NYHA class 2 (HCC) 04/18/2022   Anemia    History of laparoscopic appendectomy    Screening for osteoporosis 03/12/2022   Polyp of colon 12/03/2021   Abnormal mammogram 09/21/2021   Encounter for screening for nutritional disorder 04/07/2020   Hearing loss 04/07/2020    Mixed hyperlipidemia 04/07/2020   Shoulder pain (Right) 04/07/2020   Elevated LFTs 09/28/2019   Insomnia due to medical condition 08/24/2019   Hypoxia 08/20/2019   Essential hypertension 08/20/2019   Depression 08/20/2019   Disease due to severe acute respiratory syndrome coronavirus 2 (SARS-CoV-2) 08/20/2019   Hyperlipidemia due to type 2 diabetes mellitus (HCC) 08/20/2019   Status post total hysterectomy 08/20/2019   Type 2 diabetes mellitus with diabetic polyneuropathy, with long-term current use of insulin  (HCC) 08/20/2019   Cough, unspecified 11/27/2018   Disorder associated with type 2 diabetes mellitus (HCC) 11/27/2018   Type 2 diabetes mellitus without complications (HCC) 01/06/2017   Major depressive disorder, recurrent episode, mild degree (HCC) 01/06/2017   Chronic pain syndrome 04/08/2016   Chronic low back pain (Bilateral) (R>L) w/ sciatica (Right) 04/08/2016   Peripheral neuropathy 02/15/2015   Arthritis 08/10/2012   Esophageal reflux 08/10/2012    PCP: Autry Grayce LABOR, PA   REFERRING PROVIDER: Rocco Organ Dipakkumar, MD  REFERRING DIAG: M51.369 (ICD-10-CM) - Other intervertebral disc degeneration, lumbar region without mention of lumbar back pain or lower extremity pain  Rationale for Evaluation and Treatment: Rehabilitation  THERAPY DIAG:  Other low back pain -  Plan: PT plan of care cert/re-cert  Radiculopathy, lumbar region - Plan: PT plan of care cert/re-cert  Difficulty in walking, not elsewhere classified - Plan: PT plan of care cert/re-cert  ONSET DATE: December 2024  SUBJECTIVE:                                                                                                                                                                                           SUBJECTIVE STATEMENT:  R low back, hip, lateral thigh (around L5 dermatome) hurts. 5/10 currently. Back was good after last session. Swept yesterday which bothered her back. Wants to  continue for a few more weeks after next week.         PERTINENT HISTORY:  Low back pain. Currently wearing a back brace which is not helping. Pt states having a bulging disc. Pain started December 2024, sudden onset, unknown mechanism of injury. Pt states having R Sciatic nerve in R posterior hip pain initially. Had x-ryas done, then and MRI which revealed bulging disc. Pain radiates along the R L5 dermatome as well as sciatic nerve (to superficial peroneal nerve distribution) to dorsal foot. R anterior ankle hurts. Pain has worsened since January 2025. Surgeon wants pt to do PT.      No latex allergies   Had L eye surgery 03/17/2024, pt states she is clear to participate in PT. 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  Blood pressure is controlled per pt.  DM is controlled per pt.  Has a CPAP machine.     PAIN:  Are you having pain? Yes: NPRS scale: 8/10 Pain location: Low back and R L5 dermatome and R sciatic nerve distribution to R dorsal foot.  Pain description: Low back: pressure; R LE: ache, tight Aggravating factors: Sleeping on her R side, standing and walking Relieving factors: leaning to the L, sleeping on her L side, leaning forward, wearing her back brace  PRECAUTIONS: Cannot see with L eye.     RED FLAGS: Bowel or bladder incontinence: No and Cauda equina syndrome: No   WEIGHT BEARING RESTRICTIONS: No  FALLS:  Has patient fallen in last 6 months? No  LIVING ENVIRONMENT: Lives with: lives with their spouse Lives in: House/apartment Stairs: Yes: Internal: 2 steps; none and External: 0 steps; none Has following equipment at home: Single point cane  OCCUPATION: Housewife  PLOF: Independent  PATIENT GOALS: decrease pain  NEXT MD VISIT: none at the moment  OBJECTIVE:  Note: Objective measures were completed at Evaluation unless otherwise noted.  DIAGNOSTIC FINDINGS:    PATIENT SURVEYS:  Modified Oswestry Low Back Pain Disability Questionnaire 62%  (04/22/2024)  COGNITION: Overall cognitive status: Within functional limits for tasks assessed     SENSATION:   MUSCLE LENGTH:   POSTURE: forward neck, B protracted shoulders, L trunk side bend, movement preference around L4/L5 area, L weight shifting.   PALPATION: TTP R low back, increased B lumbar paraspinal muscle tension R > L  TTP R L5 > L4 > L3 TTP R low back > L side     LUMBAR ROM:   AROM eval  Flexion WFL, R posterior hip pain when returning to the upright position  Extension Very limited with R posterior hip pain  Right lateral flexion Limited with R lateral hip joint pain (worse than L lateral flexion)  Left lateral flexion Limited with R lateral hip pain  Right rotation [facility] with R posterior lateral hip pain worse than L rotation  Left rotation WFL with R posterior lateral hip pain.    (Blank rows = not tested)  LOWER EXTREMITY ROM:     Passive  Right 05/31/2024 Left 05/31/2024  Hip flexion    Hip extension    Hip abduction    Hip adduction    Hip internal rotation 37 18  Hip external rotation 37 with R low back pain (different pain) 34  Knee flexion    Knee extension    Ankle dorsiflexion    Ankle plantarflexion    Ankle inversion    Ankle eversion     (Blank rows = not tested)  LOWER EXTREMITY MMT:    MMT Right eval Left eval  Hip flexion    Hip extension (seated manually resisted) 3+ 3+  Hip abduction (seated manually resisted) 4- 4-  Hip adduction    Hip internal rotation    Hip external rotation    Knee flexion 4 4  Knee extension 4+ 4+  Ankle dorsiflexion    Ankle plantarflexion    Ankle inversion    Ankle eversion     (Blank rows = not tested)  LUMBAR SPECIAL TESTS:    FUNCTIONAL TESTS:    GAIT: Distance walked: 30 ft Assistive device utilized: None Level of assistance: SBA Comments: Decreased stance R LE, antalgic with R trunk side bend during R LE stance phase, decreased B knee extension during stance phase, decreased  foot clearance and gait velocity.   TREATMENT DATE: 07/07/2024                                                                                                                                Neuromuscular Re-education  Performed to promote more neutral lumbopelvic posture and strength and decrease stress to low back and LE nerves.   Reviewed POC: continue another 2x/week for 4 weeks after next.   Standing pallof press R and L with red band 5 seconds each, increased R low abck pain  Standing PNF chops to the L and R red band 2x each. Increased R low back pain  Standing B shoulder extension red  band 10x5 seconds for 3 sets  Does not bother her back   Decreased standing back pain    Standingt with B UE assist   Hip abduction    R 10x, then 5x   L 10x, then 5x    R glute med closed chain weakness and soreness.    Sitting with upright posture in neutral   Manually resisted R upper trunk rotation isometrics to decreased R rotation of low back posture. 10x3 for 5 second holds   Standing with B UE assist   Side stepping 5 ft to the L and 5 ft to the R 2x2    Improved exercise technique, movement at target joints, use of target muscles after mod verbal, visual, tactile cues.     PATIENT EDUCATION:  Education details: there-ex, HEP, POC Person educated: Patient Education method: Explanation, Demonstration, Tactile cues, Verbal cues, and Handouts Education comprehension: verbalized understanding and returned demonstration  HOME EXERCISE PROGRAM: Access Code: [email or web address] URL: [email or web address] Date: 04/22/2024 Prepared by: Emil Glassman  Exercises - Seated Transversus Abdominis Bracing  - 3 x daily - 7 x weekly - 3 sets - 10 reps - 5 seconds hold  Upgraded to standing on (06/15/2024) - Seated Gluteal Sets  - 3 x daily - 7 x weekly - 3 sets - 10 reps - 5 seconds hold  Upgraded to standing on (06/15/2024)  Reclined, hooklying hip extension isometrics with leg  straight  R 10x5 seconds for 3 sets daily.  Drawing provided.      - Supine Transversus Abdominis Bracing - Hands on Stomach  - 5 x daily - 7 x weekly - 3 sets - 10 reps - 5 seconds hold - Supine Posterior Pelvic Tilt  - 2 x daily - 7 x weekly - 3 sets - 5 reps - 15 seconds hold  - Bent Knee Fallouts with Alternating Legs  - 1 x daily - 7 x weekly - 3 sets - 5 reps  - Supine Piriformis Stretch with Towel  - 3 x daily - 7 x weekly - 1 sets - 5 reps - 30 seconds hold   Sitting with lumbar towel roll 2 min  - Seated Flexion Stretch  - 3 x daily - 7 x weekly - 1 sets - 5 reps - 30 seconds hold  - Standing Gluteal Sets  - 1 x daily - 7 x weekly - 3 sets - 10 reps - 10 seconds hold - Standing Transverse Abdominis Contraction  - 1 x daily - 7 x weekly - 3 sets - 10 reps - 10 seconds hold  - Forward Step Up with Counter Support  - 1 x daily - 7 x weekly - 3 sets - 10 reps   Seated B shoulder extension isometrics with hands on knees 5x5 seconds  - Supine Bridge  - 1 x daily - 7 x weekly - 2-3 sets - 5 reps  - Hooklying Clamshell with Resistance  - 1 x daily - 7 x weekly - 3 sets - 10 reps  Green band  - Shoulder Extension with Resistance - Palms Forward  - 1 x daily - 7 x weekly - 3 sets - 10 reps - 5 seconds hold  Red band    ASSESSMENT:  CLINICAL IMPRESSION:   Pt overall demonstrates improved back and R LE pain and improved hip strength and function since initial evaluation. Improving core strength observed clinically as well though weakness is still present. Worked on standing glute med and trunk  strengthening as well to promote ability to perform standing chores with less back and R LE pain at home. Good trunk and glute med muscle use reported with exercises. Pt states having no pain when walking and that the exercises are helping at end of session.  Pt will benefit from continued skilled physical therapy services to decrease pain, improve strength and function.     OBJECTIVE  IMPAIRMENTS: Abnormal gait, decreased balance, difficulty walking, decreased strength, improper body mechanics, postural dysfunction, and pain.   ACTIVITY LIMITATIONS: carrying, lifting, bending, sitting, standing, squatting, sleeping, stairs, transfers, bed mobility, dressing, hygiene/grooming, and locomotion level  PARTICIPATION LIMITATIONS:   PERSONAL FACTORS: Age, Fitness, Time since onset of injury/illness/exacerbation, and 3+ comorbidities: anxiety, arthritis, depression, DM, HTN, sleep apnea are also affecting patient's functional outcome.   REHAB POTENTIAL: Fair    CLINICAL DECISION MAKING: Stable/uncomplicated Pain has worsened since onset per pt.   EVALUATION COMPLEXITY: Low   GOALS: Goals reviewed with patient? Yes  SHORT TERM GOALS: Target date: 05/07/2024  Pt will be independent with her initial HEP to decrease pain, improve strength, function, and ability to perform standing tasks more comfortably.  Baseline: Pt has started her initial HEP (04/22/2024); doing her HEP, no questions (06/02/2024) Goal status: MET   LONG TERM GOALS: Target date: 07/16/2024  Pt will have a decrease in low back pain to 4/10 or less at worst to promote ability to perform standing tasks more comfortably.  Baseline:8/10 low back pain at worst for the past 3 months (04/22/2024); 5/10 back pain at most for the past 7 days, also not currently wearing her back brace (06/02/2024), 5/10 at worst but when she vacuums, back pain goes up to a 6/10. Currently no longer using her back brace (06/07/2024); 6/10 at worst for the past 7 days (06/15/2024); 5/10 at most for the past 7 days (07/05/2024) Goal status: PROGRESSING  2.  Pt will have a decrease in R LE pain to 4/10 or less at worst to promote ability to ambulate and perform standing tasks more comfortably.  Baseline: 9/10 R LE pain at worst for the past 3 months. (04/22/2024); 5/10 R LE pain at most for the past 7 days, does not hurt often like it used to  (06/02/2024), 5/10 at most but when she vacuums, R LE pain goes up to a 6/10. Currently no longer using her back brace (06/07/2024); 6/10 at most for the past 7 days. (07/05/2024) Goal status: PROGRESSING  3.  Pt will improve her Modified Oswestry Low Back Pain Disability Questionnaire by at least 20% as a demonstration of improved function.  Baseline: Modified Oswestry Low Back Pain Disability Questionnaire 62% (04/22/2024); 46 % (06/07/2024) Goal status: PROGRESSING  4.  Pt will improve her B hip extension and abduction strength by at least 1/2 MMT grade to promote ability to ambulate and perform standing tasks more comfortably.  Baseline:  MMT Right eval Left eval R (06/07/2024) L (06/07/2024)  Hip extension (seated manually resisted) 3+ 3+ 4+ 4  Hip abduction (seated manually resisted) 4- 4- 4+ 4+   (04/22/2024)  Goal status: MET   PLAN:  PT FREQUENCY: 1-2x/week  PT DURATION: 4 weeks  PLANNED INTERVENTIONS: 97110-Therapeutic exercises, 97530- Therapeutic activity, 97112- Neuromuscular re-education, 97535- Self Care, [id number]- Manual therapy, [id number]- Gait training, [phone]- Aquatic Therapy, [phone]- Electrical stimulation (unattended), [phone]- Traction (mechanical), [id number]- Ionotophoresis 4mg /ml Dexamethasone , Patient/Family education, Balance training, Stair training, Dry Needling, Joint mobilization, and Spinal mobilization.  PLAN FOR NEXT SESSION: Posture, trunk and glute strengthening,  manual techniques, modalities PRN    Akili Cuda, PT, DPT 07/07/2024, 12:19 PM

## 2024-07-11 NOTE — Progress Notes (Unsigned)
 PROVIDER NOTE: Interpretation of information contained herein should be left to medically-trained personnel. Specific patient instructions are provided elsewhere under Patient Instructions section of medical record. This document was created in part using AI and STT-dictation technology, any transcriptional errors that may result from this process are unintentional.  Patient: Erminio RAMAN Cobb  Service: E/M   PCP: Autry Grayce LABOR, PA  DOB: 1956/08/16  DOS: 07/12/2024  Provider: Eric LABOR Como, MD  MRN: [id number]  Delivery: Face-to-face  Specialty: Interventional Pain Management  Type: Established Patient  Setting: Ambulatory outpatient facility  Specialty designation: 09  Referring Prov.: Autry Grayce LABOR, PA  Location: Outpatient office facility       Primary Reason(s) for Visit: Encounter for evaluation before starting new chronic pain management plan of care (Level of risk: moderate) CC: No chief complaint on file.  HPI  Ms. Vejar is a 68 y.o. year old, female patient, who comes today for a follow-up evaluation to review the test results and decide on a treatment plan. 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); Elevated LFTs; Screening for osteoporosis; Encounter for screening for nutritional disorder; Esophageal reflux; Functional heart murmur; Hearing loss; Disorder associated with type 2 diabetes mellitus (HCC); Hyperlipidemia due to type 2 diabetes mellitus (HCC); Hypoxemic respiratory failure, chronic (HCC); Insomnia due to medical condition; Major  depressive disorder, recurrent episode, mild degree (HCC); Mixed hyperlipidemia; Obstructive sleep apnea; Shoulder pain (Right); Peripheral neuropathy; Polyp of colon; Status post total hysterectomy; Type 2 diabetes mellitus with diabetic polyneuropathy, with long-term current use of insulin  (HCC); Pharmacologic therapy; Disorder of skeletal system; Problems influencing health status; Chronic knee pain (Bilateral) (R>L); Chronic hip pain (Right); Impaired range of motion of hip (Right); Chronic lower extremity pain (Right); Impaired range of motion of knee (Right); Abnormal MRI, lumbar spine [facility]) (03/05/2024); Lumbosacral radiculopathy at L5 (Right); Chronic radicular pain of lower extremity (Right); 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 Her primarily concern today is the No chief complaint on file.  Pain Assessment: Location:     Radiating:   Onset:   Duration:   Quality:   Severity:  /10 (subjective, self-reported pain score)  Effect on ADL:   Timing:   Modifying factors:   BP:    HR:    Ms. Vincelette comes in today for a follow-up visit after her initial evaluation on 06/23/2024. Today we went over the results of her tests. These were explained in Layman's terms. During today's appointment we went over my diagnostic impression, as well as the proposed treatment plan.  Review of initial evaluation  (06/23/2024): NARCISSUS DETWILER is a 68 year old female with chronic lower back pain who presents with  radiating leg pain.   She has chronic lower back pain that began with a 'fatty nerve' issue in her buttocks. An MRI and x-ray revealed a disc issue and two broken discs. The pain is constant, predominantly on the right side, and varies in intensity, worsening at night and with prolonged standing or walking. It interferes with daily activities such as sweeping the floor and washing dishes.   The leg pain started this year, following the onset of back pain. It began at the top of the leg and gradually extended downwards, now affecting the entire leg. The pain radiates from the back to the front of the leg, reaching the top of the foot but not affecting the toes. No numbness or weakness in the legs, and the symptoms are confined to the right leg.   She is attending physical therapy twice a week at [facility], having started in May 2025. Her therapy is scheduled to continue through July 2025.   She has a history of knee surgeries, with the left knee operated on in 1981 and the right knee in 1992. She reports increased pain in the right knee since the onset of her current symptoms. She has not had any back surgeries, but she has had injections for her back pain.   She has been prescribed oral steroids for her pain, which helped but also caused her blood sugar to rise, as she is diabetic. She takes insulin  for her diabetes and a baby aspirin  daily. No history of diabetic ketoacidosis, diabetic coma, or other complications from diabetes. She has not experienced any blood clots or cardiovascular events.  Review of diagnostic test ordered on 06/23/2024:  Diagnostic lab work: *** Diagnostic imaging: ***  Discussed the use of AI scribe software for clinical note transcription with the patient, who gave verbal consent to proceed.  History of Present Illness           Patient presented with interventional treatment options. Ms. Luby was informed that I will not be providing medication management. Pharmacotherapy evaluation including recommendations may be offered, if specifically requested.   Controlled Substance Pharmacotherapy Assessment REMS (Risk Evaluation and Mitigation Strategy)  Opioid Analgesic: None MME/day: 0 mg/day   Pill Count: None expected due to no prior prescriptions written by our practice. No notes on file  Pharmacokinetics: Liberation and absorption (onset of action): WNL Distribution (time to peak effect): WNL Metabolism and excretion (duration of action): WNL         Pharmacodynamics: Desired effects: Analgesia: Ms. Swenson reports >50% benefit. Functional ability: Patient reports that medication allows her to accomplish basic ADLs Clinically meaningful improvement in function (CMIF): Sustained CMIF goals met Perceived effectiveness: Described as relatively effective, allowing for increase in activities of daily living (ADL) Undesirable effects: Side-effects or Adverse reactions: None reported Monitoring: [place] PMP: PDMP reviewed during this encounter. Online review of the past 91-month period previously conducted. Not applicable at this point since we have not taken over the patient's medication management yet. List of other Serum/Urine Drug Screening Test(s):  No results found for: AMPHSCRSER, BARBSCRSER, BENZOSCRSER, COCAINSCRSER, COCAINSCRNUR, PCPSCRSER, THCSCRSER, THCU, CANNABQUANT, OPIATESCRSER, OXYSCRSER, PROPOXSCRSER, ETH, CBDTHCR, D8THCCBX, D9THCCBX List of all UDS test(s) done:  Lab Results  Component Value Date   SUMMARY FINAL 06/23/2024   Last UDS on record: Summary  Date Value Ref Range Status  06/23/2024 FINAL  Final    Comment:    ==================================================================== Compliance Drug Analysis,  Ur ==================================================================== Test  Result       Flag       Units  Drug Present and Declared for Prescription Verification   Gabapentin                      PRESENT      EXPECTED   Citalopram                      PRESENT      EXPECTED   Desmethylcitalopram            PRESENT      EXPECTED    Desmethylcitalopram is an expected metabolite of citalopram  or the    enantiomeric form, escitalopram.    Acetaminophen                   PRESENT      EXPECTED  Drug Absent but Declared for Prescription Verification   Salicylate                     Not Detected UNEXPECTED    Aspirin , as indicated in the declared medication list, is not always    detected even when used as directed.    Ibuprofen                      Not Detected UNEXPECTED    Ibuprofen, as indicated in the declared medication list, is not    always detected even when used as directed.  ==================================================================== Test                      Result    Flag   Units      Ref Range   Creatinine              129              mg/dL      >=79 ==================================================================== Declared Medications:  The flagging and interpretation on this report are based on the  following declared medications.  Unexpected results may arise from  inaccuracies in the declared medications.   **Note: The testing scope of this panel includes these medications:   Citalopram  (Celexa )  Gabapentin  (Neurontin )   **Note: The testing scope of this panel does not include small to  moderate amounts of these reported medications:   Acetaminophen  (Tylenol )  Aspirin   Ibuprofen (Advil)   **Note: The testing scope of this panel does not include the  following reported medications:   Amlodipine  (Norvasc )  Benzonatate (Tessalon)  Insulin  (NovoLog )  Metformin (Glucophage)  Pantoprazole  (Protonix )  Potassium  Pravastatin   (Pravachol )  Semaglutide  Sitagliptin (Januvia)  Vitamin D3 ==================================================================== For clinical consultation, please call [phone]. ====================================================================    UDS interpretation: No unexpected findings.          Medication Assessment Form: Not applicable. No opioids. Treatment compliance: Not applicable Risk Assessment Profile: Aberrant behavior: See initial evaluations. None observed or detected today Comorbid factors increasing risk of overdose: See initial evaluation. No additional risks detected today Opioid risk tool (ORT):     06/23/2024    1:00 PM  Opioid Risk   Alcohol 3  Illegal Drugs 0  Rx Drugs 0  Alcohol 0  Illegal Drugs 0  Rx Drugs 0  Age between 16-45 years  0  History of Preadolescent Sexual Abuse 0  Psychological Disease 0  Depression 0  Opioid Risk Tool Scoring 3  Opioid Risk Interpretation Low Risk  ORT Scoring interpretation table:  Score <3 = Low Risk for SUD  Score between 4-7 = Moderate Risk for SUD  Score >8 = High Risk for Opioid Abuse   Risk of substance use disorder (SUD): Low  Risk Mitigation Strategies:  Patient opioid safety counseling: No controlled substances prescribed. Patient-Prescriber Agreement (PPA): No agreement signed.  Controlled substance notification to other providers: None required. No opioid therapy.  Pharmacologic Plan: Non-opioid analgesic therapy offered. Interventional alternatives discussed.             Laboratory Chemistry Profile   Renal Lab Results  Component Value Date   BUN 13 06/23/2024   CREATININE 0.75 06/23/2024   BCR 17 06/23/2024   GFRNONAA >60 03/19/2022     Electrolytes Lab Results  Component Value Date   NA 140 06/23/2024   K 4.3 06/23/2024   CL 101 06/23/2024   CALCIUM 9.8 06/23/2024   MG 1.6 06/23/2024     Hepatic Lab Results  Component Value Date   AST 18 06/23/2024   ALBUMIN 4.6  06/23/2024   ALKPHOS 73 06/23/2024     ID Lab Results  Component Value Date   HIV Non Reactive 03/18/2022     Bone Lab Results  Component Value Date   25OHVITD1 57 06/23/2024   25OHVITD2 <1.0 06/23/2024   25OHVITD3 56 06/23/2024     Endocrine Lab Results  Component Value Date   GLUCOSE 229 (H) 06/23/2024   HGBA1C 9.5 (H) 03/18/2022     Neuropathy Lab Results  Component Value Date   VITAMINB12 242 06/23/2024   HGBA1C 9.5 (H) 03/18/2022   HIV Non Reactive 03/18/2022     CNS No results found for: COLORCSF, APPEARCSF, RBCCOUNTCSF, WBCCSF, POLYSCSF, LYMPHSCSF, EOSCSF, PROTEINCSF, GLUCCSF, JCVIRUS, CSFOLI, IGGCSF, LABACHR, ACETBL   Inflammation (CRP: Acute  ESR: Chronic) Lab Results  Component Value Date   CRP 1 06/23/2024   ESRSEDRATE 11 06/23/2024     Rheumatology No results found for: RF, ANA, LABURIC, URICUR, LYMEIGGIGMAB, LYMEABIGMQN, HLAB27   Coagulation Lab Results  Component Value Date   PLT 251 03/19/2022     Cardiovascular Lab Results  Component Value Date   HGB 10.1 (L) 03/19/2022   HCT 32.4 (L) 03/19/2022     Screening Lab Results  Component Value Date   HIV Non Reactive 03/18/2022     Cancer No results found for: CEA, CA125, LABCA2   Allergens No results found for: ALMOND, APPLE, ASPARAGUS, AVOCADO, BANANA, BARLEY, BASIL, BAYLEAF, GREENBEAN, LIMABEAN, WHITEBEAN, BEEFIGE, REDBEET, BLUEBERRY, BROCCOLI, CABBAGE, MELON, CARROT, CASEIN, CASHEWNUT, CAULIFLOWER, CELERY     Note: Lab results reviewed.  Recent Diagnostic Imaging Review  Lumbosacral Imaging: Lumbar DG Bending views: Results for orders placed during the hospital encounter of 06/23/24 DG Lumbar Spine Complete W/Bend  Narrative CLINICAL DATA:  Chronic bilateral low back pain with right-sided sciatica. Chronic pain of right lower extremity.  EXAM: LUMBAR SPINE - COMPLETE WITH BENDING  VIEWS  COMPARISON:  None Available.  FINDINGS: Five non-rib-bearing lumbar vertebra. Broad-based dextroscoliotic curvature of the lumbar spine. No listhesis. No abnormal motion on flexion or extension. Anterior spurring throughout the lumbar spine with mild diffuse disc space narrowing. Mild facet hypertrophy at L4-L5 and L5-S1. No evidence of fracture compression deformity. No focal bone lesion or pars defects.  IMPRESSION: 1. Mild diffuse degenerative disc disease and lower lumbar facet hypertrophy. 2. Broad-based dextroscoliotic curvature of the lumbar spine.   Electronically Signed By: Andrea Gasman M.D. On: 06/25/2024 16:09  Hip Imaging: Hip-R DG  2-3 views: Results for orders placed during the hospital encounter of 06/23/24 DG HIP UNILAT W OR W/O PELVIS 2-3 VIEWS RIGHT  Narrative CLINICAL DATA:  Chronic right hip pain. Impaired range of motion of right hip.  EXAM: DG HIP (WITH OR WITHOUT PELVIS) 2-3V RIGHT  COMPARISON:  None Available.  FINDINGS: Slight bilateral hip joint space narrowing. Bilateral acetabular spurring. Minimal spurring of the right femoral head neck junction. No fracture, evidence of erosion or avascular necrosis. No focal bone lesion or bony destructive change. The pubic symphysis and sacroiliac joints are congruent. Mild sacroiliac spurring. Scattered phleboliths in the pelvis.  IMPRESSION: Mild osteoarthritis of both hips.   Electronically Signed By: Andrea Gasman M.D. On: 06/25/2024 16:08  Knee Imaging: Knee-R DG 4 views: Results for orders placed during the hospital encounter of 06/23/24 DG Knee Complete 4 Views Right  Narrative CLINICAL DATA:  Bilateral chronic knee pain. Impaired range of motion of right knee.  EXAM: RIGHT KNEE - COMPLETE 4+ VIEW  COMPARISON:  None Available.  FINDINGS: The alignment and joint spaces are normal. Mild to moderate tricompartmental peripheral spurring. There is chondrocalcinosis.  No fracture, erosion, or focal bone abnormality. Small joint effusion.  IMPRESSION: 1. Mild to moderate tricompartmental osteoarthritis. 2. Chondrocalcinosis. 3. Small joint effusion.   Electronically Signed By: Andrea Gasman M.D. On: 06/25/2024 16:04  Knee-L DG 4 views: Results for orders placed during the hospital encounter of 06/23/24  DG Knee Complete 4 Views Left  Narrative CLINICAL DATA:  Chronic bilateral knee pain.  EXAM: LEFT KNEE - COMPLETE 4+ VIEW  COMPARISON:  None Available.  FINDINGS: Slight lateral downsloping of the lateral tibial plateau. Mild to moderate tricompartmental peripheral spurring. There is chondrocalcinosis. No fracture, erosion, or focal bone abnormality. Small joint effusion.  IMPRESSION: 1. Mild to moderate tricompartmental osteoarthritis. 2. Chondrocalcinosis. 3. Small joint effusion.   Electronically Signed By: Andrea Gasman M.D. On: 06/25/2024 16:06  Complexity Note: Imaging results reviewed.                         Meds   Current Outpatient Medications:    acetaminophen  (TYLENOL ) 650 MG CR tablet, Take 650 mg by mouth 2 (two) times daily., Disp: , Rfl:    amLODipine  (NORVASC ) 10 MG tablet, Take 10 mg by mouth daily., Disp: , Rfl:    aspirin  81 MG chewable tablet, Chew 81 mg by mouth daily., Disp: , Rfl:    benzonatate (TESSALON) 100 MG capsule, Take 100 mg by mouth 3 (three) times daily as needed for cough., Disp: , Rfl:    Cholecalciferol (VITAMIN D3) 125 MCG (5000 UT) CAPS, Take by mouth., Disp: , Rfl:    citalopram  (CELEXA ) 20 MG tablet, Take 20 mg by mouth daily. With dinner, Disp: , Rfl:    gabapentin  (NEURONTIN ) 800 MG tablet, Take 800 mg by mouth 3 (three) times daily., Disp: , Rfl:    ibuprofen (ADVIL,MOTRIN) 200 MG tablet, Take 400 mg by mouth every 6 (six) hours as needed for moderate pain., Disp: , Rfl:    metFORMIN (GLUCOPHAGE) 500 MG tablet, Take 500 mg by mouth 3 (three) times daily., Disp: , Rfl:     pantoprazole  (PROTONIX ) 40 MG tablet, Take 40 mg by mouth daily., Disp: , Rfl:    pravastatin  (PRAVACHOL ) 10 MG tablet, Take 10 mg by mouth daily., Disp: , Rfl:    Semaglutide (OZEMPIC, 0.25 OR 0.5 MG/DOSE, [place]), Inject into the skin., Disp: , Rfl:  ROS  Constitutional: Denies any fever or chills Gastrointestinal: No reported hemesis, hematochezia, vomiting, or acute GI distress Musculoskeletal: Denies any acute onset joint swelling, redness, loss of ROM, or weakness Neurological: No reported episodes of acute onset apraxia, aphasia, dysarthria, agnosia, amnesia, paralysis, loss of coordination, or loss of consciousness  Allergies  Ms. Mielke has no known allergies.  PFSH  Drug: Ms. Mcglown  reports no history of drug use. Alcohol:  reports no history of alcohol use. Tobacco:  reports that she has never smoked. She has never used smokeless tobacco. Medical:  has a past medical history of Anxiety, Arthritis, Complication of anesthesia (03/18/2022), Depression, Diabetes mellitus without complication (HCC), Dyspnea, Hyperlipemia, Hypertension, Neuropathy, and Sleep apnea. Surgical: Ms. Fawaz  has a past surgical history that includes Knee arthrocentesis (Left, 1981); Knee arthrocentesis (Bilateral, 1992); HYSTERECTOMY SUPRACERVICAL ABDOMINAL W/REMOVAL TUBES &/OR OVARIES (1995); Abdominal hysterectomy (1995); Knee arthrocentesis (Bilateral); Colonoscopy with propofol  (N/A, 05/27/2016); Cataract extraction w/PHACO (Left, 09/25/2017); Eye surgery (Left, 09/25/2017); Cataract extraction w/PHACO (Right, 10/16/2017); Kenolog injection (Right, 10/16/2017); Esophagogastroduodenoscopy (egd) with propofol  (N/A, 07/10/2021); Colonoscopy with propofol  (N/A, 07/10/2021); Esophagogastroduodenoscopy (egd) with propofol  (N/A, 10/30/2021); Colonoscopy with propofol  (N/A, 10/30/2021); Colonoscopy with propofol  (N/A, 02/05/2022); Xi robotic laparoscopic assisted appendectomy (N/A, 03/18/2022); Colonoscopy with propofol   (N/A, 07/10/2023); polypectomy (07/10/2023); biopsy (07/10/2023); and Appendectomy. Family: family history is not on file.  Constitutional Exam  General appearance: Well nourished, well developed, and well hydrated. In no apparent acute distress There were no vitals filed for this visit. BMI Assessment: Estimated body mass index is 28.32 kg/m as calculated from the following:   Height as of 06/23/24: 5' (1.524 m).   Weight as of 06/23/24: 145 lb (65.8 kg).  BMI interpretation table: BMI level Category Range association with higher incidence of chronic pain  <18 kg/m2 Underweight   18.5-24.9 kg/m2 Ideal body weight   25-29.9 kg/m2 Overweight Increased incidence by 20%  30-34.9 kg/m2 Obese (Class I) Increased incidence by 68%  35-39.9 kg/m2 Severe obesity (Class II) Increased incidence by 136%  >40 kg/m2 Extreme obesity (Class III) Increased incidence by 254%   Patient's current BMI Ideal Body weight  There is no height or weight on file to calculate BMI. Patient weight not recorded   BMI Readings from Last 4 Encounters:  06/23/24 28.32 kg/m  07/10/23 28.47 kg/m  03/18/22 30.27 kg/m  03/13/22 30.27 kg/m   Wt Readings from Last 4 Encounters:  06/23/24 145 lb (65.8 kg)  07/10/23 145 lb 12.8 oz (66.1 kg)  03/18/22 155 lb (70.3 kg)  03/13/22 155 lb (70.3 kg)    Psych/Mental status: Alert, oriented x 3 (person, place, & time)       Eyes: PERLA Respiratory: No evidence of acute respiratory distress  Assessment & Plan  Primary Diagnosis & Pertinent Problem List: The primary encounter diagnosis was Chronic low back pain (Bilateral) (R>L) w/ sciatica (Right). 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 Visit Diagnosis: 1. 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 Low back pain of over 3 months duration   3. Multifactorial low back pain   4. Low back pain radiating to leg (Right)   5. Low back pain potentially associated with radiculopathy   6. Chronic lower extremity pain (Right)   7. Chronic radicular pain of lower extremity (Right)   8. DDD of lumbosacral region w/ LBP & LEP   9. 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 Foraminal stenosis of lumbosacral region (L5-S1) (SEVERE  on Right)   11. 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 Lumbosacral radiculopathy at L5 (Right)   14. Dextroscoliosis of lumbar spine (L3-4)   15. Lumbar facet arthropathy (Bilateral: L2-3, L3-4, L4-5, L5-S1)   16. Lumbar facet joint pain (Bilateral)   17. 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 Osteoarthritis of lumbar spine   19. Abnormal MRI, lumbar spine ([facility]) (03/05/2024)   20. Chronic hip pain (Right)   21. Chronic knee pain (Bilateral) (R>L)    Problems updated and reviewed during this visit: No problems updated.  Plan of Care  Assessment and Plan             Pharmacotherapy (Medications Ordered): No orders of the defined types were  placed in this encounter.  Procedure Orders    No procedure(s) ordered today   No orders of the defined types were placed in this encounter.  Lab Orders  No laboratory test(s) ordered today   Imaging Orders  No imaging studies ordered today   Referral Orders  No referral(s) requested today    Pharmacological management:  Opioid Analgesics: I will not be prescribing any opioids at this time Membrane stabilizer: I will not be prescribing any at this time Muscle relaxant: I will not be prescribing any at this time NSAID: I will not be prescribing any at this time Other analgesic(s): I will not be prescribing any at this time      Interventional Therapies  Risk Factors  Considerations  Medical Comorbidities:  Class 2 diastolic-CHF  HTN  GERD  OSA  IDDM  Depression     Planned  Pending:      Under consideration:   Therapeutic right L4-5 & L5-S1 TFESI #1  Therapeutic right L4-5 LESI #1  Diagnostic/therapeutic right IA hip joint inj. #1  Diagnostic/therapeutic right IA knee inj. #1  Diagnostic bilateral lumbar facet MBB #1  Diagnostic lower extremity EMG/PNCV    Completed: (Analgesic benefit)1  None at this time   Therapeutic  Palliative (PRN) options:   None established   Completed by other providers:   Oral steroid taper done.  Some improvement, but no complete resolution of symptoms.  Referral to physical therapy done.   1(Analgesic benefit): Expressed in percentage (%). (Local anesthetic[LA] +/- sedation  L.A.Local Anesthetic  Steroid benefit  Ongoing benefit)      Provider-requested follow-up: No follow-ups on file. Recent Visits Date Type Provider Dept  06/23/24 Office Visit Tanya Glisson, MD [facility]  Showing recent visits within past 90 days and meeting all other requirements Future Appointments Date Type Provider Dept  07/12/24 Appointment Tanya Glisson, MD [facility]  Showing future appointments within next  90 days and meeting all other requirements   Primary Care Physician: Autry Grayce LABOR, PA  Duration of encounter: *** minutes.  Total time on encounter, as per AMA guidelines included both the face-to-face and non-face-to-face time personally spent by the physician and/or other qualified health care professional(s) on the day of the encounter (includes time in activities that require the physician or other qualified health care professional and does not include time in activities normally performed by clinical staff). Physician's time may include the following activities when performed: Preparing to see the patient (e.g., pre-charting review of records, searching for previously ordered imaging, lab work, and nerve conduction tests) Review of prior analgesic pharmacotherapies. Reviewing PMP Interpreting ordered tests (e.g., lab work, imaging, nerve conduction tests) Performing post-procedure evaluations, including interpretation of diagnostic procedures Obtaining and/or reviewing separately obtained history Performing a medically appropriate examination and/or evaluation Counseling and educating the patient/family/caregiver Ordering medications, tests, or procedures Referring and communicating with other health care professionals (when not separately reported) Documenting clinical information in the electronic or other health record Independently interpreting results (not separately reported) and communicating results to the patient/ family/caregiver Care coordination (not separately reported)  Note by: Eric LABOR Como, MD (TTS technology used. I apologize for any typographical errors that were not detected and corrected.) Date: 07/12/2024; Time: 6:42 PM

## 2024-07-12 ENCOUNTER — Ambulatory Visit

## 2024-07-12 ENCOUNTER — Ambulatory Visit: Attending: Pain Medicine | Admitting: Pain Medicine

## 2024-07-12 ENCOUNTER — Encounter: Payer: Self-pay | Admitting: Pain Medicine

## 2024-07-12 VITALS — BP 128/62 | HR 79 | Temp 97.8°F | Resp 16 | Ht 60.0 in | Wt 145.0 lb

## 2024-07-12 DIAGNOSIS — M47816 Spondylosis without myelopathy or radiculopathy, lumbar region: Secondary | ICD-10-CM | POA: Diagnosis present

## 2024-07-12 DIAGNOSIS — M170 Bilateral primary osteoarthritis of knee: Secondary | ICD-10-CM | POA: Diagnosis present

## 2024-07-12 DIAGNOSIS — M48061 Spinal stenosis, lumbar region without neurogenic claudication: Secondary | ICD-10-CM | POA: Insufficient documentation

## 2024-07-12 DIAGNOSIS — M5410 Radiculopathy, site unspecified: Secondary | ICD-10-CM | POA: Diagnosis present

## 2024-07-12 DIAGNOSIS — M51372 Other intervertebral disc degeneration, lumbosacral region with discogenic back pain and lower extremity pain: Secondary | ICD-10-CM | POA: Diagnosis present

## 2024-07-12 DIAGNOSIS — M5459 Other low back pain: Secondary | ICD-10-CM | POA: Insufficient documentation

## 2024-07-12 DIAGNOSIS — M79604 Pain in right leg: Secondary | ICD-10-CM | POA: Diagnosis present

## 2024-07-12 DIAGNOSIS — M25562 Pain in left knee: Secondary | ICD-10-CM | POA: Diagnosis present

## 2024-07-12 DIAGNOSIS — R937 Abnormal findings on diagnostic imaging of other parts of musculoskeletal system: Secondary | ICD-10-CM | POA: Diagnosis present

## 2024-07-12 DIAGNOSIS — M5416 Radiculopathy, lumbar region: Secondary | ICD-10-CM

## 2024-07-12 DIAGNOSIS — M545 Low back pain, unspecified: Secondary | ICD-10-CM | POA: Diagnosis present

## 2024-07-12 DIAGNOSIS — M4807 Spinal stenosis, lumbosacral region: Secondary | ICD-10-CM | POA: Diagnosis present

## 2024-07-12 DIAGNOSIS — M4186 Other forms of scoliosis, lumbar region: Secondary | ICD-10-CM | POA: Diagnosis present

## 2024-07-12 DIAGNOSIS — M25561 Pain in right knee: Secondary | ICD-10-CM | POA: Insufficient documentation

## 2024-07-12 DIAGNOSIS — G8929 Other chronic pain: Secondary | ICD-10-CM | POA: Insufficient documentation

## 2024-07-12 DIAGNOSIS — M5126 Other intervertebral disc displacement, lumbar region: Secondary | ICD-10-CM | POA: Insufficient documentation

## 2024-07-12 DIAGNOSIS — M11261 Other chondrocalcinosis, right knee: Secondary | ICD-10-CM | POA: Insufficient documentation

## 2024-07-12 DIAGNOSIS — M5417 Radiculopathy, lumbosacral region: Secondary | ICD-10-CM | POA: Diagnosis present

## 2024-07-12 DIAGNOSIS — M25551 Pain in right hip: Secondary | ICD-10-CM | POA: Insufficient documentation

## 2024-07-12 DIAGNOSIS — M5441 Lumbago with sciatica, right side: Secondary | ICD-10-CM | POA: Diagnosis present

## 2024-07-12 DIAGNOSIS — R262 Difficulty in walking, not elsewhere classified: Secondary | ICD-10-CM

## 2024-07-12 DIAGNOSIS — M11262 Other chondrocalcinosis, left knee: Secondary | ICD-10-CM | POA: Diagnosis present

## 2024-07-12 NOTE — Patient Instructions (Signed)

## 2024-07-12 NOTE — Progress Notes (Signed)
 Safety precautions to be maintained throughout the outpatient stay will include: orient to surroundings, keep bed in low position, maintain call bell within reach at all times, provide assistance with transfer out of bed and ambulation.

## 2024-07-12 NOTE — Therapy (Signed)
 OUTPATIENT PHYSICAL THERAPY TREATMENT/PROGRESS NOTE Dates of Reporting Period: 06/07/24 - 07/12/24   Patient Name: Emma White MRN: [id number] DOB:06-27-1956, 68 y.o., female Today's Date: 07/12/2024  END OF SESSION:  PT End of Session - 07/12/24 1432     Visit Number 20    Number of Visits 25    Date for PT Re-Evaluation 08/13/24    PT Start Time 1431    PT Stop Time 1512    PT Time Calculation (min) 41 min    Activity Tolerance Patient tolerated treatment well    Behavior During Therapy [facility] for tasks assessed/performed             Past Medical History:  Diagnosis Date   Anxiety    Arthritis    hand   Complication of anesthesia 03/18/2022   Hypoxia in pacu. See pacu note 03/18/2022. The hospitalist was consulted for further work-up.   Depression    Diabetes mellitus without complication (HCC)    Dyspnea    DOE   Hyperlipemia    Hypertension    Neuropathy    Sleep apnea    Past Surgical History:  Procedure Laterality Date   ABDOMINAL HYSTERECTOMY  1995   APPENDECTOMY     BIOPSY  07/10/2023   Procedure: BIOPSY;  Surgeon: Onita Elspeth Sharper, DO;  Location: [facility] ENDOSCOPY;  Service: Gastroenterology;;   CATARACT EXTRACTION W/PHACO Left 09/25/2017   Procedure: CATARACT EXTRACTION PHACO AND INTRAOCULAR LENS PLACEMENT (IOC);  Surgeon: Myrna Adine Anes, MD;  Location: [facility] ORS;  Service: Ophthalmology;  Laterality: Left;   flui pack lot # [id number] H  US  00:32.7 AP%   12.17 CDE   3.95   CATARACT EXTRACTION W/PHACO Right 10/16/2017   Procedure: CATARACT EXTRACTION PHACO AND INTRAOCULAR LENS PLACEMENT (IOC);  Surgeon: Myrna Adine Anes, MD;  Location: [facility] ORS;  Service: Ophthalmology;  Laterality: Right;  US  00:32.8 AP% 9.0 CDE 2.96 FLUID PACK LOT # [id number]   COLONOSCOPY WITH PROPOFOL  N/A 05/27/2016   Procedure: COLONOSCOPY WITH PROPOFOL ;  Surgeon: Lamar ONEIDA Holmes, MD;  Location: [facility] ENDOSCOPY;  Service: Endoscopy;  Laterality: N/A;   COLONOSCOPY WITH  PROPOFOL  N/A 07/10/2021   Procedure: COLONOSCOPY WITH PROPOFOL ;  Surgeon: Maryruth Ole ONEIDA, MD;  Location: [facility] ENDOSCOPY;  Service: Endoscopy;  Laterality: N/A;   COLONOSCOPY WITH PROPOFOL  N/A 10/30/2021   Procedure: COLONOSCOPY WITH PROPOFOL ;  Surgeon: Maryruth Ole ONEIDA, MD;  Location: [facility] ENDOSCOPY;  Service: Endoscopy;  Laterality: N/A;   COLONOSCOPY WITH PROPOFOL  N/A 02/05/2022   Procedure: COLONOSCOPY WITH PROPOFOL ;  Surgeon: Maryruth Ole ONEIDA, MD;  Location: [facility] ENDOSCOPY;  Service: Endoscopy;  Laterality: N/A;   COLONOSCOPY WITH PROPOFOL  N/A 07/10/2023   Procedure: COLONOSCOPY WITH PROPOFOL ;  Surgeon: Onita Elspeth Sharper, DO;  Location: [facility] ENDOSCOPY;  Service: Gastroenterology;  Laterality: N/A;   ESOPHAGOGASTRODUODENOSCOPY (EGD) WITH PROPOFOL  N/A 07/10/2021   Procedure: ESOPHAGOGASTRODUODENOSCOPY (EGD) WITH PROPOFOL ;  Surgeon: Maryruth Ole ONEIDA, MD;  Location: [facility] ENDOSCOPY;  Service: Endoscopy;  Laterality: N/A;  IDDM   ESOPHAGOGASTRODUODENOSCOPY (EGD) WITH PROPOFOL  N/A 10/30/2021   Procedure: ESOPHAGOGASTRODUODENOSCOPY (EGD) WITH PROPOFOL ;  Surgeon: Maryruth Ole ONEIDA, MD;  Location: [facility] ENDOSCOPY;  Service: Endoscopy;  Laterality: N/A;  IDDM   EYE SURGERY Left 09/25/2017   cataract extraction   HYSTERECTOMY SUPRACERVICAL ABDOMINAL W/REMOVAL TUBES &/OR OVARIES  1995   KENALOG  INJECTION Right 10/16/2017   Procedure: KENALOG  INJECTION;  Surgeon: Myrna Adine Anes, MD;  Location: [facility] ORS;  Service: Ophthalmology;  Laterality: Right;   KNEE ARTHROCENTESIS Left 1981   KNEE  ARTHROCENTESIS Bilateral 1992   right one in 1992   KNEE ARTHROCENTESIS Bilateral    POLYPECTOMY  07/10/2023   Procedure: POLYPECTOMY;  Surgeon: Onita Elspeth Sharper, DO;  Location: [facility] ENDOSCOPY;  Service: Gastroenterology;;   XI ROBOTIC LAPAROSCOPIC ASSISTED APPENDECTOMY N/A 03/18/2022   Procedure: XI ROBOTIC LAPAROSCOPIC ASSISTED APPENDECTOMY;  Surgeon: Rodolph Romano, MD;  Location:  [facility] ORS;  Service: General;  Laterality: N/A;   Patient Active Problem List   Diagnosis Date Noted   Lumbar facet hypertrophy (Bilateral: L4-5, L5-S1) 07/12/2024   Lumbar facet joint syndrome 07/12/2024   Tricompartment osteoarthritis of knees (Bilateral) 07/12/2024   Chondrocalcinosis of knees (Bilateral) 07/12/2024   Chronic knee pain (Bilateral) (R>L) 06/23/2024   Chronic hip pain (Right) 06/23/2024   Impaired range of motion of hip (Right) 06/23/2024   Chronic lower extremity pain (Right) 06/23/2024   Impaired range of motion of knee (Right) 06/23/2024   Abnormal MRI, lumbar spine [facility] (03/05/2024) 06/23/2024   Lumbosacral radiculopathy at L5 (Right) 06/23/2024   Chronic radicular pain of lower extremity (Right) 06/23/2024   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, L4-5) 06/23/2024   Foraminal stenosis of lumbosacral region (L5-S1) (SEVERE  on Right) 06/23/2024   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) 06/23/2024   Lumbar facet arthropathy (Bilateral: L2-3, L3-4, L4-5, L5-S1) 06/23/2024   Lumbar facet joint pain (Bilateral) 06/23/2024   Low back pain of over 3 months duration 06/23/2024   Low back pain potentially associated with radiculopathy 06/23/2024   Low back pain radiating to leg (Right) 06/23/2024   Multifactorial low back pain 06/23/2024   Dextroscoliosis of lumbar spine (L3-4) 06/23/2024   DDD of lumbosacral region w/ LBP & LEP 06/23/2024   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) 06/23/2024   Osteoarthritis of lumbar spine 06/23/2024   Pharmacologic therapy 06/22/2024   Disorder of skeletal system 06/22/2024   Problems influencing health status 06/22/2024   Functional heart murmur 05/24/2024   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) 03/07/2024   Hypoxemic respiratory failure, chronic (HCC) 09/10/2022   Obstructive sleep apnea 05/29/2022   Chronic diastolic CHF (congestive heart failure), NYHA class 2 (HCC)  04/18/2022   Anemia    History of laparoscopic appendectomy    Screening for osteoporosis 03/12/2022   Polyp of colon 12/03/2021   Abnormal mammogram 09/21/2021   Encounter for screening for nutritional disorder 04/07/2020   Hearing loss 04/07/2020   Mixed hyperlipidemia 04/07/2020   Shoulder pain (Right) 04/07/2020   Elevated LFTs 09/28/2019   Insomnia due to medical condition 08/24/2019   Hypoxia 08/20/2019   Essential hypertension 08/20/2019   Depression 08/20/2019   Disease due to severe acute respiratory syndrome coronavirus 2 (SARS-CoV-2) 08/20/2019   Hyperlipidemia due to type 2 diabetes mellitus (HCC) 08/20/2019   Status post total hysterectomy 08/20/2019   Type 2 diabetes mellitus with diabetic polyneuropathy, with long-term current use of insulin  (HCC) 08/20/2019   Cough, unspecified 11/27/2018   Disorder associated with type 2 diabetes mellitus (HCC) 11/27/2018   Type 2 diabetes mellitus without complications (HCC) 01/06/2017   Major depressive disorder, recurrent episode, mild degree (HCC) 01/06/2017   Chronic pain syndrome 04/08/2016   Chronic low back pain (Bilateral) (R>L) w/ sciatica (Right) 04/08/2016   Peripheral neuropathy 02/15/2015   Arthritis 08/10/2012   Esophageal reflux 08/10/2012    PCP: Autry Grayce LABOR, PA   REFERRING PROVIDER: Rocco Organ Dipakkumar, MD  REFERRING DIAG: M51.369 (ICD-10-CM) - Other intervertebral disc degeneration, lumbar region without mention  of lumbar back pain or lower extremity pain  Rationale for Evaluation and Treatment: Rehabilitation  THERAPY DIAG:  Other low back pain  Radiculopathy, lumbar region  Difficulty in walking, not elsewhere classified  ONSET DATE: December 2024  SUBJECTIVE:                                                                                                                                                                                           SUBJECTIVE STATEMENT:  R sided LBP is  a 5/10 NPS. RLE from hip to the top of the foot also 5/10 NPS. Reports seeing MD today and has to go get an injection in her spine to see if it helps her R sided LBP.      PERTINENT HISTORY:  Low back pain. Currently wearing a back brace which is not helping. Pt states having a bulging disc. Pain started December 2024, sudden onset, unknown mechanism of injury. Pt states having R Sciatic nerve in R posterior hip pain initially. Had x-ryas done, then and MRI which revealed bulging disc. Pain radiates along the R L5 dermatome as well as sciatic nerve (to superficial peroneal nerve distribution) to dorsal foot. R anterior ankle hurts. Pain has worsened since January 2025. Surgeon wants pt to do PT.      No latex allergies   Had L eye surgery 03/17/2024, pt states she is clear to participate in PT. 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  Blood pressure is controlled per pt.  DM is controlled per pt.  Has a CPAP machine.     PAIN:  Are you having pain? Yes: NPRS scale: 5/10 Pain location: Low back and R L5 dermatome and R sciatic nerve distribution to R dorsal foot.  Pain description: Low back: pressure; R LE: ache, tight Aggravating factors: Sleeping on her R side, standing and walking Relieving factors: leaning to the L, sleeping on her L side, leaning forward, wearing her back brace  PRECAUTIONS: Cannot see with L eye.     RED FLAGS: Bowel or bladder incontinence: No and Cauda equina syndrome: No   WEIGHT BEARING RESTRICTIONS: No  FALLS:  Has patient fallen in last 6 months? No  LIVING ENVIRONMENT: Lives with: lives with their spouse Lives in: House/apartment Stairs: Yes: Internal: 2 steps; none and External: 0 steps; none Has following equipment at home: Single point cane  OCCUPATION: Housewife  PLOF: Independent  PATIENT GOALS: decrease pain  NEXT MD VISIT: none at the moment  OBJECTIVE:  Note: Objective measures were completed at Evaluation unless otherwise  noted.  DIAGNOSTIC FINDINGS:    PATIENT SURVEYS:  Modified Oswestry Low  Back Pain Disability Questionnaire 62% (04/22/2024)  COGNITION: Overall cognitive status: Within functional limits for tasks assessed     SENSATION:   MUSCLE LENGTH:   POSTURE: forward neck, B protracted shoulders, L trunk side bend, movement preference around L4/L5 area, L weight shifting.   PALPATION: TTP R low back, increased B lumbar paraspinal muscle tension R > L  TTP R L5 > L4 > L3 TTP R low back > L side     LUMBAR ROM:   AROM eval  Flexion WFL, R posterior hip pain when returning to the upright position  Extension Very limited with R posterior hip pain  Right lateral flexion Limited with R lateral hip joint pain (worse than L lateral flexion)  Left lateral flexion Limited with R lateral hip pain  Right rotation [facility] with R posterior lateral hip pain worse than L rotation  Left rotation WFL with R posterior lateral hip pain.    (Blank rows = not tested)  LOWER EXTREMITY ROM:     Passive  Right 05/31/2024 Left 05/31/2024  Hip flexion    Hip extension    Hip abduction    Hip adduction    Hip internal rotation 37 18  Hip external rotation 37 with R low back pain (different pain) 34  Knee flexion    Knee extension    Ankle dorsiflexion    Ankle plantarflexion    Ankle inversion    Ankle eversion     (Blank rows = not tested)  LOWER EXTREMITY MMT:    MMT Right eval Left eval  Hip flexion    Hip extension (seated manually resisted) 3+ 3+  Hip abduction (seated manually resisted) 4- 4-  Hip adduction    Hip internal rotation    Hip external rotation    Knee flexion 4 4  Knee extension 4+ 4+  Ankle dorsiflexion    Ankle plantarflexion    Ankle inversion    Ankle eversion     (Blank rows = not tested)  LUMBAR SPECIAL TESTS:    FUNCTIONAL TESTS:    GAIT: Distance walked: 30 ft Assistive device utilized: None Level of assistance: SBA Comments: Decreased stance R LE,  antalgic with R trunk side bend during R LE stance phase, decreased B knee extension during stance phase, decreased foot clearance and gait velocity.   TREATMENT DATE: 07/12/2024    Physical performance: 10 minutes Reviewed mODI and subjective pain goals. See goals for details   There.Act:   Standing B shoulder extension GTB 10x5 seconds for 3 sets  Standing scap retraction with GTB: 3x10. VC's for slowed completion of reps. Fair compliance.   Standing with B UE assist   Hip abduction: 2x10/side  Hip extension: 2x10/side   Ambulating laterally along mat table with BUE support over x2 hurdles: 3 laps down/back   2x20' farmers carry with 5# DB. Notable difficulty with upright posture and R sided QL region/hip pain holding in RUE. Gait quality diminished too with R sided antalgic gait and poor foot clearance bilat but R > L.    Seated alternating russian twists with 2 KG med ball: 2x10/side. VC's to modify rotation AROM to keep it pain free.    PATIENT EDUCATION:  Education details: there-ex, HEP, POC Person educated: Patient Education method: Explanation, Demonstration, Tactile cues, Verbal cues, and Handouts Education comprehension: verbalized understanding and returned demonstration  HOME EXERCISE PROGRAM: Access Code: [email or web address] URL: [email or web address] Date: 04/22/2024 Prepared by: Emil Glassman  Exercises - Seated Transversus Abdominis Bracing  -  3 x daily - 7 x weekly - 3 sets - 10 reps - 5 seconds hold  Upgraded to standing on (06/15/2024) - Seated Gluteal Sets  - 3 x daily - 7 x weekly - 3 sets - 10 reps - 5 seconds hold  Upgraded to standing on (06/15/2024)  Reclined, hooklying hip extension isometrics with leg straight  R 10x5 seconds for 3 sets daily.  Drawing provided.      - Supine Transversus Abdominis Bracing - Hands on Stomach  - 5 x daily - 7 x weekly - 3 sets - 10 reps - 5 seconds hold - Supine Posterior Pelvic Tilt  - 2 x daily - 7 x weekly - 3  sets - 5 reps - 15 seconds hold  - Bent Knee Fallouts with Alternating Legs  - 1 x daily - 7 x weekly - 3 sets - 5 reps  - Supine Piriformis Stretch with Towel  - 3 x daily - 7 x weekly - 1 sets - 5 reps - 30 seconds hold   Sitting with lumbar towel roll 2 min  - Seated Flexion Stretch  - 3 x daily - 7 x weekly - 1 sets - 5 reps - 30 seconds hold  - Standing Gluteal Sets  - 1 x daily - 7 x weekly - 3 sets - 10 reps - 10 seconds hold - Standing Transverse Abdominis Contraction  - 1 x daily - 7 x weekly - 3 sets - 10 reps - 10 seconds hold  - Forward Step Up with Counter Support  - 1 x daily - 7 x weekly - 3 sets - 10 reps   Seated B shoulder extension isometrics with hands on knees 5x5 seconds  - Supine Bridge  - 1 x daily - 7 x weekly - 2-3 sets - 5 reps  - Hooklying Clamshell with Resistance  - 1 x daily - 7 x weekly - 3 sets - 10 reps  Green band  - Shoulder Extension with Resistance - Palms Forward  - 1 x daily - 7 x weekly - 3 sets - 10 reps - 5 seconds hold  Red band    ASSESSMENT:  CLINICAL IMPRESSION: Pt on 20th visit warranting progress note. Pt remains with elevated pain levels with standing/walking tasks but does continue to have a lower mODI score indicative of reduced disability due to her LBP. Continued POC otherwise address core and LE weakness. R sided lower back weakness remains prominent with functional movements today with worsening of concordant pain. Anticipate future sessions need to continue to focus on core and lumbar strengthening. Pt will benefit from continued skilled physical therapy services to decrease pain, improve strength and function.     OBJECTIVE IMPAIRMENTS: Abnormal gait, decreased balance, difficulty walking, decreased strength, improper body mechanics, postural dysfunction, and pain.   ACTIVITY LIMITATIONS: carrying, lifting, bending, sitting, standing, squatting, sleeping, stairs, transfers, bed mobility, dressing, hygiene/grooming, and  locomotion level  PARTICIPATION LIMITATIONS:   PERSONAL FACTORS: Age, Fitness, Time since onset of injury/illness/exacerbation, and 3+ comorbidities: anxiety, arthritis, depression, DM, HTN, sleep apnea are also affecting patient's functional outcome.   REHAB POTENTIAL: Fair    CLINICAL DECISION MAKING: Stable/uncomplicated Pain has worsened since onset per pt.   EVALUATION COMPLEXITY: Low   GOALS: Goals reviewed with patient? Yes  SHORT TERM GOALS: Target date: 05/07/2024  Pt will be independent with her initial HEP to decrease pain, improve strength, function, and ability to perform standing tasks more comfortably.  Baseline: Pt  has started her initial HEP (04/22/2024); doing her HEP, no questions (06/02/2024) Goal status: MET   LONG TERM GOALS: Target date: 07/16/2024  Pt will have a decrease in low back pain to 4/10 or less at worst to promote ability to perform standing tasks more comfortably.  Baseline:8/10 low back pain at worst for the past 3 months (04/22/2024); 5/10 back pain at most for the past 7 days, also not currently wearing her back brace (06/02/2024), 5/10 at worst but when she vacuums, back pain goes up to a 6/10. Currently no longer using her back brace (06/07/2024); 6/10 at worst for the past 7 days (06/15/2024); 5/10 at most for the past 7 days (07/05/2024); 07/12/24: 5/10 NPS consistently Goal status: PROGRESSING  2.  Pt will have a decrease in R LE pain to 4/10 or less at worst to promote ability to ambulate and perform standing tasks more comfortably.  Baseline: 9/10 R LE pain at worst for the past 3 months. (04/22/2024); 5/10 R LE pain at most for the past 7 days, does not hurt often like it used to (06/02/2024), 5/10 at most but when she vacuums, R LE pain goes up to a 6/10. Currently no longer using her back brace (06/07/2024); 6/10 at most for the past 7 days. (07/05/2024);  Goal status: PROGRESSING  3.  Pt will improve her Modified Oswestry Low Back Pain Disability  Questionnaire by at least 20% as a demonstration of improved function.  Baseline: Modified Oswestry Low Back Pain Disability Questionnaire 62% (04/22/2024); 46 % (06/07/2024); 07/12/24: 36% Goal status: PROGRESSING  4.  Pt will improve her B hip extension and abduction strength by at least 1/2 MMT grade to promote ability to ambulate and perform standing tasks more comfortably.  Baseline:  MMT Right eval Left eval R (06/07/2024) L (06/07/2024)  Hip extension (seated manually resisted) 3+ 3+ 4+ 4  Hip abduction (seated manually resisted) 4- 4- 4+ 4+   (04/22/2024);  Goal status: MET   PLAN:  PT FREQUENCY: 1-2x/week  PT DURATION: 4 weeks  PLANNED INTERVENTIONS: 97110-Therapeutic exercises, 97530- Therapeutic activity, 97112- Neuromuscular re-education, [phone]- Self Care, [id number]- Manual therapy, [phone]- Gait training, [phone]- Aquatic Therapy, [phone]- Electrical stimulation (unattended), [phone]- Traction (mechanical), [id number]- Ionotophoresis 4mg /ml Dexamethasone , Patient/Family education, Balance training, Stair training, Dry Needling, Joint mobilization, and Spinal mobilization.  PLAN FOR NEXT SESSION: Posture, trunk and glute strengthening, manual techniques, modalities PRN    Dorina HERO. Fairly IV, PT, DPT Physical Therapist- [place]  [facility] 07/12/2024, 3:36 PM

## 2024-07-14 ENCOUNTER — Ambulatory Visit

## 2024-07-15 ENCOUNTER — Ambulatory Visit

## 2024-07-15 DIAGNOSIS — M5416 Radiculopathy, lumbar region: Secondary | ICD-10-CM

## 2024-07-15 DIAGNOSIS — M5459 Other low back pain: Secondary | ICD-10-CM

## 2024-07-15 DIAGNOSIS — R262 Difficulty in walking, not elsewhere classified: Secondary | ICD-10-CM

## 2024-07-15 NOTE — Therapy (Signed)
 OUTPATIENT PHYSICAL THERAPY TREATMENT   Patient Name: Emma White MRN: [id number] DOB:Apr 07, 1956, 68 y.o., female Today's Date: 07/15/2024  END OF SESSION:  PT End of Session - 07/15/24 1431     Visit Number 21    Number of Visits 25    Date for PT Re-Evaluation 08/13/24    PT Start Time 1431    PT Stop Time 1515    PT Time Calculation (min) 44 min    Activity Tolerance Patient tolerated treatment well    Behavior During Therapy [facility] for tasks assessed/performed             Past Medical History:  Diagnosis Date   Anxiety    Arthritis    hand   Complication of anesthesia 03/18/2022   Hypoxia in pacu. See pacu note 03/18/2022. The hospitalist was consulted for further work-up.   Depression    Diabetes mellitus without complication (HCC)    Dyspnea    DOE   Hyperlipemia    Hypertension    Neuropathy    Sleep apnea    Past Surgical History:  Procedure Laterality Date   ABDOMINAL HYSTERECTOMY  1995   APPENDECTOMY     BIOPSY  07/10/2023   Procedure: BIOPSY;  Surgeon: Onita Elspeth Sharper, DO;  Location: [facility] ENDOSCOPY;  Service: Gastroenterology;;   CATARACT EXTRACTION W/PHACO Left 09/25/2017   Procedure: CATARACT EXTRACTION PHACO AND INTRAOCULAR LENS PLACEMENT (IOC);  Surgeon: Myrna Adine Anes, MD;  Location: [facility] ORS;  Service: Ophthalmology;  Laterality: Left;   flui pack lot # [id number] H  US  00:32.7 AP%   12.17 CDE   3.95   CATARACT EXTRACTION W/PHACO Right 10/16/2017   Procedure: CATARACT EXTRACTION PHACO AND INTRAOCULAR LENS PLACEMENT (IOC);  Surgeon: Myrna Adine Anes, MD;  Location: [facility] ORS;  Service: Ophthalmology;  Laterality: Right;  US  00:32.8 AP% 9.0 CDE 2.96 FLUID PACK LOT # [id number]   COLONOSCOPY WITH PROPOFOL  N/A 05/27/2016   Procedure: COLONOSCOPY WITH PROPOFOL ;  Surgeon: Lamar ONEIDA Holmes, MD;  Location: [facility] ENDOSCOPY;  Service: Endoscopy;  Laterality: N/A;   COLONOSCOPY WITH PROPOFOL  N/A 07/10/2021   Procedure: COLONOSCOPY WITH PROPOFOL ;   Surgeon: Maryruth Ole ONEIDA, MD;  Location: [facility] ENDOSCOPY;  Service: Endoscopy;  Laterality: N/A;   COLONOSCOPY WITH PROPOFOL  N/A 10/30/2021   Procedure: COLONOSCOPY WITH PROPOFOL ;  Surgeon: Maryruth Ole ONEIDA, MD;  Location: [facility] ENDOSCOPY;  Service: Endoscopy;  Laterality: N/A;   COLONOSCOPY WITH PROPOFOL  N/A 02/05/2022   Procedure: COLONOSCOPY WITH PROPOFOL ;  Surgeon: Maryruth Ole ONEIDA, MD;  Location: [facility] ENDOSCOPY;  Service: Endoscopy;  Laterality: N/A;   COLONOSCOPY WITH PROPOFOL  N/A 07/10/2023   Procedure: COLONOSCOPY WITH PROPOFOL ;  Surgeon: Onita Elspeth Sharper, DO;  Location: [facility] ENDOSCOPY;  Service: Gastroenterology;  Laterality: N/A;   ESOPHAGOGASTRODUODENOSCOPY (EGD) WITH PROPOFOL  N/A 07/10/2021   Procedure: ESOPHAGOGASTRODUODENOSCOPY (EGD) WITH PROPOFOL ;  Surgeon: Maryruth Ole ONEIDA, MD;  Location: [facility] ENDOSCOPY;  Service: Endoscopy;  Laterality: N/A;  IDDM   ESOPHAGOGASTRODUODENOSCOPY (EGD) WITH PROPOFOL  N/A 10/30/2021   Procedure: ESOPHAGOGASTRODUODENOSCOPY (EGD) WITH PROPOFOL ;  Surgeon: Maryruth Ole ONEIDA, MD;  Location: [facility] ENDOSCOPY;  Service: Endoscopy;  Laterality: N/A;  IDDM   EYE SURGERY Left 09/25/2017   cataract extraction   HYSTERECTOMY SUPRACERVICAL ABDOMINAL W/REMOVAL TUBES &/OR OVARIES  1995   KENALOG  INJECTION Right 10/16/2017   Procedure: KENALOG  INJECTION;  Surgeon: Myrna Adine Anes, MD;  Location: [facility] ORS;  Service: Ophthalmology;  Laterality: Right;   KNEE ARTHROCENTESIS Left 1981   KNEE ARTHROCENTESIS Bilateral 1992   right one in  1992   KNEE ARTHROCENTESIS Bilateral    POLYPECTOMY  07/10/2023   Procedure: POLYPECTOMY;  Surgeon: Onita Elspeth Sharper, DO;  Location: [facility] ENDOSCOPY;  Service: Gastroenterology;;   XI ROBOTIC LAPAROSCOPIC ASSISTED APPENDECTOMY N/A 03/18/2022   Procedure: XI ROBOTIC LAPAROSCOPIC ASSISTED APPENDECTOMY;  Surgeon: Rodolph Romano, MD;  Location: [facility] ORS;  Service: General;  Laterality: N/A;   Patient Active  Problem List   Diagnosis Date Noted   Lumbar facet hypertrophy (Bilateral: L4-5, L5-S1) 07/12/2024   Lumbar facet joint syndrome 07/12/2024   Tricompartment osteoarthritis of knees (Bilateral) 07/12/2024   Chondrocalcinosis of knees (Bilateral) 07/12/2024   Chronic knee pain (Bilateral) (R>L) 06/23/2024   Chronic hip pain (Right) 06/23/2024   Impaired range of motion of hip (Right) 06/23/2024   Chronic lower extremity pain (Right) 06/23/2024   Impaired range of motion of knee (Right) 06/23/2024   Abnormal MRI, lumbar spine [facility]) (03/05/2024) 06/23/2024   Lumbosacral radiculopathy at L5 (Right) 06/23/2024   Chronic radicular pain of lower extremity (Right) 06/23/2024   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, L4-5) 06/23/2024   Foraminal stenosis of lumbosacral region (L5-S1) (SEVERE  on Right) 06/23/2024   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) 06/23/2024   Lumbar facet arthropathy (Bilateral: L2-3, L3-4, L4-5, L5-S1) 06/23/2024   Lumbar facet joint pain (Bilateral) 06/23/2024   Low back pain of over 3 months duration 06/23/2024   Low back pain potentially associated with radiculopathy 06/23/2024   Low back pain radiating to leg (Right) 06/23/2024   Multifactorial low back pain 06/23/2024   Dextroscoliosis of lumbar spine (L3-4) 06/23/2024   DDD of lumbosacral region w/ LBP & LEP 06/23/2024   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) 06/23/2024   Osteoarthritis of lumbar spine 06/23/2024   Pharmacologic therapy 06/22/2024   Disorder of skeletal system 06/22/2024   Problems influencing health status 06/22/2024   Functional heart murmur 05/24/2024   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) 03/07/2024   Hypoxemic respiratory failure, chronic (HCC) 09/10/2022   Obstructive sleep apnea 05/29/2022   Chronic diastolic CHF (congestive heart failure), NYHA class 2 (HCC) 04/18/2022   Anemia    History of laparoscopic appendectomy     Screening for osteoporosis 03/12/2022   Polyp of colon 12/03/2021   Abnormal mammogram 09/21/2021   Encounter for screening for nutritional disorder 04/07/2020   Hearing loss 04/07/2020   Mixed hyperlipidemia 04/07/2020   Shoulder pain (Right) 04/07/2020   Elevated LFTs 09/28/2019   Insomnia due to medical condition 08/24/2019   Hypoxia 08/20/2019   Essential hypertension 08/20/2019   Depression 08/20/2019   Disease due to severe acute respiratory syndrome coronavirus 2 (SARS-CoV-2) 08/20/2019   Hyperlipidemia due to type 2 diabetes mellitus (HCC) 08/20/2019   Status post total hysterectomy 08/20/2019   Type 2 diabetes mellitus with diabetic polyneuropathy, with long-term current use of insulin  (HCC) 08/20/2019   Cough, unspecified 11/27/2018   Disorder associated with type 2 diabetes mellitus (HCC) 11/27/2018   Type 2 diabetes mellitus without complications (HCC) 01/06/2017   Major depressive disorder, recurrent episode, mild degree (HCC) 01/06/2017   Chronic pain syndrome 04/08/2016   Chronic low back pain (Bilateral) (R>L) w/ sciatica (Right) 04/08/2016   Peripheral neuropathy 02/15/2015   Arthritis 08/10/2012   Esophageal reflux 08/10/2012    PCP: Autry Grayce LABOR, PA   REFERRING PROVIDER: Rocco Organ Dipakkumar, MD  REFERRING DIAG: M51.369 (ICD-10-CM) - Other intervertebral disc degeneration, lumbar region without mention of lumbar back pain or lower extremity pain  Rationale for Evaluation and Treatment: Rehabilitation  THERAPY DIAG:  Other low back pain  Radiculopathy, lumbar region  Difficulty in walking, not elsewhere classified  ONSET DATE: December 2024  SUBJECTIVE:                                                                                                                                                                                           SUBJECTIVE STATEMENT:  R sided LBP is a 4/10 NPS and also R hip.   PERTINENT HISTORY:  Low back  pain. Currently wearing a back brace which is not helping. Pt states having a bulging disc. Pain started December 2024, sudden onset, unknown mechanism of injury. Pt states having R Sciatic nerve in R posterior hip pain initially. Had x-ryas done, then and MRI which revealed bulging disc. Pain radiates along the R L5 dermatome as well as sciatic nerve (to superficial peroneal nerve distribution) to dorsal foot. R anterior ankle hurts. Pain has worsened since January 2025. Surgeon wants pt to do PT.      No latex allergies   Had L eye surgery 03/17/2024, pt states she is clear to participate in PT. 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  Blood pressure is controlled per pt.  DM is controlled per pt.  Has a CPAP machine.     PAIN:  Are you having pain? Yes: NPRS scale: 4/10 Pain location: Low back and R L5 dermatome and R sciatic nerve distribution to R dorsal foot.  Pain description: Low back: pressure; R LE: ache, tight Aggravating factors: Sleeping on her R side, standing and walking Relieving factors: leaning to the L, sleeping on her L side, leaning forward, wearing her back brace  PRECAUTIONS: Cannot see with L eye.     RED FLAGS: Bowel or bladder incontinence: No and Cauda equina syndrome: No   WEIGHT BEARING RESTRICTIONS: No  FALLS:  Has patient fallen in last 6 months? No  LIVING ENVIRONMENT: Lives with: lives with their spouse Lives in: House/apartment Stairs: Yes: Internal: 2 steps; none and External: 0 steps; none Has following equipment at home: Single point cane  OCCUPATION: Housewife  PLOF: Independent  PATIENT GOALS: decrease pain  NEXT MD VISIT: none at the moment  OBJECTIVE:  Note: Objective measures were completed at Evaluation unless otherwise noted.  DIAGNOSTIC FINDINGS:    PATIENT SURVEYS:  Modified Oswestry Low Back Pain Disability Questionnaire 62% (04/22/2024)  COGNITION: Overall cognitive status: Within functional limits for tasks  assessed     SENSATION:   MUSCLE LENGTH:   POSTURE: forward neck, B protracted shoulders, L trunk side bend, movement preference around L4/L5 area,  L weight shifting.   PALPATION: TTP R low back, increased B lumbar paraspinal muscle tension R > L  TTP R L5 > L4 > L3 TTP R low back > L side     LUMBAR ROM:   AROM eval  Flexion WFL, R posterior hip pain when returning to the upright position  Extension Very limited with R posterior hip pain  Right lateral flexion Limited with R lateral hip joint pain (worse than L lateral flexion)  Left lateral flexion Limited with R lateral hip pain  Right rotation [facility] with R posterior lateral hip pain worse than L rotation  Left rotation WFL with R posterior lateral hip pain.    (Blank rows = not tested)  LOWER EXTREMITY ROM:     Passive  Right 05/31/2024 Left 05/31/2024  Hip flexion    Hip extension    Hip abduction    Hip adduction    Hip internal rotation 37 18  Hip external rotation 37 with R low back pain (different pain) 34  Knee flexion    Knee extension    Ankle dorsiflexion    Ankle plantarflexion    Ankle inversion    Ankle eversion     (Blank rows = not tested)  LOWER EXTREMITY MMT:    MMT Right eval Left eval  Hip flexion    Hip extension (seated manually resisted) 3+ 3+  Hip abduction (seated manually resisted) 4- 4-  Hip adduction    Hip internal rotation    Hip external rotation    Knee flexion 4 4  Knee extension 4+ 4+  Ankle dorsiflexion    Ankle plantarflexion    Ankle inversion    Ankle eversion     (Blank rows = not tested)  LUMBAR SPECIAL TESTS:    FUNCTIONAL TESTS:    GAIT: Distance walked: 30 ft Assistive device utilized: None Level of assistance: SBA Comments: Decreased stance R LE, antalgic with R trunk side bend during R LE stance phase, decreased B knee extension during stance phase, decreased foot clearance and gait velocity.   TREATMENT DATE: 07/15/2024     There.Act:    Standing B shoulder extension GTB 3x10. VC's for form/technique.   Standing scap retraction with GTB: 3x10. VC's for slowed completion of reps. Fair compliance.   Standing with B UE assist   Hip abduction: 3x10/side. 2# AW's.  Hip extension: 3x10/side. 2# AW's.   Ambulating laterally along mat table with BUE support over x2 hurdles: 3 laps down/back x2. Seated rest b/t sets.   2x20' farmers carry with 5# DB. Notable difficulty with upright posture and R sided QL region/hip pain holding in RUE. Gait quality diminished too with R sided antalgic gait and poor foot clearance bilat but R > L.   Seated paloff press anti rotation: x12R/L with GTB   Seated alternating russian twists with 2 KG med ball: 3x10/side. VC's to modify rotation AROM to keep it pain free.    PATIENT EDUCATION:  Education details: there-ex, HEP, POC Person educated: Patient Education method: Explanation, Demonstration, Tactile cues, Verbal cues, and Handouts Education comprehension: verbalized understanding and returned demonstration  HOME EXERCISE PROGRAM: Access Code: [email or web address] URL: [email or web address] Date: 04/22/2024 Prepared by: Emil Glassman  Exercises - Seated Transversus Abdominis Bracing  - 3 x daily - 7 x weekly - 3 sets - 10 reps - 5 seconds hold  Upgraded to standing on (06/15/2024) - Seated Gluteal Sets  - 3 x daily - 7 x weekly - 3 sets -  10 reps - 5 seconds hold  Upgraded to standing on (06/15/2024)  Reclined, hooklying hip extension isometrics with leg straight  R 10x5 seconds for 3 sets daily.  Drawing provided.      - Supine Transversus Abdominis Bracing - Hands on Stomach  - 5 x daily - 7 x weekly - 3 sets - 10 reps - 5 seconds hold - Supine Posterior Pelvic Tilt  - 2 x daily - 7 x weekly - 3 sets - 5 reps - 15 seconds hold  - Bent Knee Fallouts with Alternating Legs  - 1 x daily - 7 x weekly - 3 sets - 5 reps  - Supine Piriformis Stretch with Towel  - 3 x daily - 7 x  weekly - 1 sets - 5 reps - 30 seconds hold   Sitting with lumbar towel roll 2 min  - Seated Flexion Stretch  - 3 x daily - 7 x weekly - 1 sets - 5 reps - 30 seconds hold  - Standing Gluteal Sets  - 1 x daily - 7 x weekly - 3 sets - 10 reps - 10 seconds hold - Standing Transverse Abdominis Contraction  - 1 x daily - 7 x weekly - 3 sets - 10 reps - 10 seconds hold  - Forward Step Up with Counter Support  - 1 x daily - 7 x weekly - 3 sets - 10 reps   Seated B shoulder extension isometrics with hands on knees 5x5 seconds  - Supine Bridge  - 1 x daily - 7 x weekly - 2-3 sets - 5 reps  - Hooklying Clamshell with Resistance  - 1 x daily - 7 x weekly - 3 sets - 10 reps  Green band  - Shoulder Extension with Resistance - Palms Forward  - 1 x daily - 7 x weekly - 3 sets - 10 reps - 5 seconds hold  Red band    ASSESSMENT:  CLINICAL IMPRESSION: Continuing PT POC working on core, hip, and periscapular strength training. Intermittent, therapeutic seated rest breaks provided to decrease R LBP and hip pain with standing exercises. Continuing to incorporate greater standing activity to improve strength/tolerance in functional positions to relate to ADL completion. Remains limited with dynamic standing/walking exercises leading for need to seated rest. Pt will benefit from continued skilled physical therapy services to decrease pain, improve strength and function.     OBJECTIVE IMPAIRMENTS: Abnormal gait, decreased balance, difficulty walking, decreased strength, improper body mechanics, postural dysfunction, and pain.   ACTIVITY LIMITATIONS: carrying, lifting, bending, sitting, standing, squatting, sleeping, stairs, transfers, bed mobility, dressing, hygiene/grooming, and locomotion level  PARTICIPATION LIMITATIONS:   PERSONAL FACTORS: Age, Fitness, Time since onset of injury/illness/exacerbation, and 3+ comorbidities: anxiety, arthritis, depression, DM, HTN, sleep apnea are also affecting  patient's functional outcome.   REHAB POTENTIAL: Fair    CLINICAL DECISION MAKING: Stable/uncomplicated Pain has worsened since onset per pt.   EVALUATION COMPLEXITY: Low   GOALS: Goals reviewed with patient? Yes  SHORT TERM GOALS: Target date: 05/07/2024  Pt will be independent with her initial HEP to decrease pain, improve strength, function, and ability to perform standing tasks more comfortably.  Baseline: Pt has started her initial HEP (04/22/2024); doing her HEP, no questions (06/02/2024) Goal status: MET   LONG TERM GOALS: Target date: 07/16/2024  Pt will have a decrease in low back pain to 4/10 or less at worst to promote ability to perform standing tasks more comfortably.  Baseline:8/10 low back pain at  worst for the past 3 months (04/22/2024); 5/10 back pain at most for the past 7 days, also not currently wearing her back brace (06/02/2024), 5/10 at worst but when she vacuums, back pain goes up to a 6/10. Currently no longer using her back brace (06/07/2024); 6/10 at worst for the past 7 days (06/15/2024); 5/10 at most for the past 7 days (07/05/2024); 07/12/24: 5/10 NPS consistently Goal status: PROGRESSING  2.  Pt will have a decrease in R LE pain to 4/10 or less at worst to promote ability to ambulate and perform standing tasks more comfortably.  Baseline: 9/10 R LE pain at worst for the past 3 months. (04/22/2024); 5/10 R LE pain at most for the past 7 days, does not hurt often like it used to (06/02/2024), 5/10 at most but when she vacuums, R LE pain goes up to a 6/10. Currently no longer using her back brace (06/07/2024); 6/10 at most for the past 7 days. (07/05/2024);  Goal status: PROGRESSING  3.  Pt will improve her Modified Oswestry Low Back Pain Disability Questionnaire by at least 20% as a demonstration of improved function.  Baseline: Modified Oswestry Low Back Pain Disability Questionnaire 62% (04/22/2024); 46 % (06/07/2024); 07/12/24: 36% Goal status: PROGRESSING  4.  Pt will  improve her B hip extension and abduction strength by at least 1/2 MMT grade to promote ability to ambulate and perform standing tasks more comfortably.  Baseline:  MMT Right eval Left eval R (06/07/2024) L (06/07/2024)  Hip extension (seated manually resisted) 3+ 3+ 4+ 4  Hip abduction (seated manually resisted) 4- 4- 4+ 4+   (04/22/2024);  Goal status: MET   PLAN:  PT FREQUENCY: 1-2x/week  PT DURATION: 4 weeks  PLANNED INTERVENTIONS: 97110-Therapeutic exercises, 97530- Therapeutic activity, 97112- Neuromuscular re-education, [phone]- Self Care, [id number]- Manual therapy, [phone]- Gait training, [phone]- Aquatic Therapy, [phone]- Electrical stimulation (unattended), [phone]- Traction (mechanical), [id number]- Ionotophoresis 4mg /ml Dexamethasone , Patient/Family education, Balance training, Stair training, Dry Needling, Joint mobilization, and Spinal mobilization.  PLAN FOR NEXT SESSION: Posture, trunk and glute strengthening, manual techniques, modalities PRN    Dorina HERO. Fairly IV, PT, DPT Physical Therapist- [place]  [facility] 07/15/2024, 4:10 PM

## 2024-07-28 ENCOUNTER — Ambulatory Visit: Attending: Neurological Surgery

## 2024-07-28 DIAGNOSIS — M5416 Radiculopathy, lumbar region: Secondary | ICD-10-CM | POA: Diagnosis present

## 2024-07-28 DIAGNOSIS — M5459 Other low back pain: Secondary | ICD-10-CM | POA: Diagnosis present

## 2024-07-28 DIAGNOSIS — R262 Difficulty in walking, not elsewhere classified: Secondary | ICD-10-CM | POA: Insufficient documentation

## 2024-07-28 NOTE — Therapy (Addendum)
 OUTPATIENT PHYSICAL THERAPY TREATMENT   Patient Name: Emma White MRN: [id number] DOB:19-Feb-1956, 68 y.o., female Today's Date: 07/29/2024  END OF SESSION:  PT End of Session - 07/28/24 0817     Visit Number 22    Number of Visits 25    Date for PT Re-Evaluation 08/13/24    PT Start Time 0818    PT Stop Time 0857    PT Time Calculation (min) 39 min    Activity Tolerance Patient tolerated treatment well    Behavior During Therapy [facility] for tasks assessed/performed              Past Medical History:  Diagnosis Date   Anxiety    Arthritis    hand   Complication of anesthesia 03/18/2022   Hypoxia in pacu. See pacu note 03/18/2022. The hospitalist was consulted for further work-up.   Depression    Diabetes mellitus without complication (HCC)    Dyspnea    DOE   Hyperlipemia    Hypertension    Neuropathy    Sleep apnea    Past Surgical History:  Procedure Laterality Date   ABDOMINAL HYSTERECTOMY  1995   APPENDECTOMY     BIOPSY  07/10/2023   Procedure: BIOPSY;  Surgeon: Onita Elspeth Sharper, DO;  Location: [facility] ENDOSCOPY;  Service: Gastroenterology;;   CATARACT EXTRACTION W/PHACO Left 09/25/2017   Procedure: CATARACT EXTRACTION PHACO AND INTRAOCULAR LENS PLACEMENT (IOC);  Surgeon: Myrna Adine Anes, MD;  Location: [facility] ORS;  Service: Ophthalmology;  Laterality: Left;   flui pack lot # [id number] H  US  00:32.7 AP%   12.17 CDE   3.95   CATARACT EXTRACTION W/PHACO Right 10/16/2017   Procedure: CATARACT EXTRACTION PHACO AND INTRAOCULAR LENS PLACEMENT (IOC);  Surgeon: Myrna Adine Anes, MD;  Location: [facility] ORS;  Service: Ophthalmology;  Laterality: Right;  US  00:32.8 AP% 9.0 CDE 2.96 FLUID PACK LOT # [id number]   COLONOSCOPY WITH PROPOFOL  N/A 05/27/2016   Procedure: COLONOSCOPY WITH PROPOFOL ;  Surgeon: Lamar ONEIDA Holmes, MD;  Location: [facility] ENDOSCOPY;  Service: Endoscopy;  Laterality: N/A;   COLONOSCOPY WITH PROPOFOL  N/A 07/10/2021   Procedure: COLONOSCOPY WITH PROPOFOL ;   Surgeon: Maryruth Ole ONEIDA, MD;  Location: [facility] ENDOSCOPY;  Service: Endoscopy;  Laterality: N/A;   COLONOSCOPY WITH PROPOFOL  N/A 10/30/2021   Procedure: COLONOSCOPY WITH PROPOFOL ;  Surgeon: Maryruth Ole ONEIDA, MD;  Location: [facility] ENDOSCOPY;  Service: Endoscopy;  Laterality: N/A;   COLONOSCOPY WITH PROPOFOL  N/A 02/05/2022   Procedure: COLONOSCOPY WITH PROPOFOL ;  Surgeon: Maryruth Ole ONEIDA, MD;  Location: [facility] ENDOSCOPY;  Service: Endoscopy;  Laterality: N/A;   COLONOSCOPY WITH PROPOFOL  N/A 07/10/2023   Procedure: COLONOSCOPY WITH PROPOFOL ;  Surgeon: Onita Elspeth Sharper, DO;  Location: [facility] ENDOSCOPY;  Service: Gastroenterology;  Laterality: N/A;   ESOPHAGOGASTRODUODENOSCOPY (EGD) WITH PROPOFOL  N/A 07/10/2021   Procedure: ESOPHAGOGASTRODUODENOSCOPY (EGD) WITH PROPOFOL ;  Surgeon: Maryruth Ole ONEIDA, MD;  Location: [facility] ENDOSCOPY;  Service: Endoscopy;  Laterality: N/A;  IDDM   ESOPHAGOGASTRODUODENOSCOPY (EGD) WITH PROPOFOL  N/A 10/30/2021   Procedure: ESOPHAGOGASTRODUODENOSCOPY (EGD) WITH PROPOFOL ;  Surgeon: Maryruth Ole ONEIDA, MD;  Location: [facility] ENDOSCOPY;  Service: Endoscopy;  Laterality: N/A;  IDDM   EYE SURGERY Left 09/25/2017   cataract extraction   HYSTERECTOMY SUPRACERVICAL ABDOMINAL W/REMOVAL TUBES &/OR OVARIES  1995   KENALOG  INJECTION Right 10/16/2017   Procedure: KENALOG  INJECTION;  Surgeon: Myrna Adine Anes, MD;  Location: [facility] ORS;  Service: Ophthalmology;  Laterality: Right;   KNEE ARTHROCENTESIS Left 1981   KNEE ARTHROCENTESIS Bilateral 1992   right one  in 1992   KNEE ARTHROCENTESIS Bilateral    POLYPECTOMY  07/10/2023   Procedure: POLYPECTOMY;  Surgeon: Onita Elspeth Sharper, DO;  Location: [facility] ENDOSCOPY;  Service: Gastroenterology;;   XI ROBOTIC LAPAROSCOPIC ASSISTED APPENDECTOMY N/A 03/18/2022   Procedure: XI ROBOTIC LAPAROSCOPIC ASSISTED APPENDECTOMY;  Surgeon: Rodolph Romano, MD;  Location: [facility] ORS;  Service: General;  Laterality: N/A;   Patient Active  Problem List   Diagnosis Date Noted   Lumbar facet hypertrophy (Bilateral: L4-5, L5-S1) 07/12/2024   Lumbar facet joint syndrome 07/12/2024   Tricompartment osteoarthritis of knees (Bilateral) 07/12/2024   Chondrocalcinosis of knees (Bilateral) 07/12/2024   Chronic knee pain (Bilateral) (R>L) 06/23/2024   Chronic hip pain (Right) 06/23/2024   Impaired range of motion of hip (Right) 06/23/2024   Chronic lower extremity pain (Right) 06/23/2024   Impaired range of motion of knee (Right) 06/23/2024   Abnormal MRI, lumbar spine [facility]) (03/05/2024) 06/23/2024   Lumbosacral radiculopathy at L5 (Right) 06/23/2024   Chronic radicular pain of lower extremity (Right) 06/23/2024   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, L4-5) 06/23/2024   Foraminal stenosis of lumbosacral region (L5-S1) (SEVERE  on Right) 06/23/2024   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) 06/23/2024   Lumbar facet arthropathy (Bilateral: L2-3, L3-4, L4-5, L5-S1) 06/23/2024   Lumbar facet joint pain (Bilateral) 06/23/2024   Low back pain of over 3 months duration 06/23/2024   Low back pain potentially associated with radiculopathy 06/23/2024   Low back pain radiating to leg (Right) 06/23/2024   Multifactorial low back pain 06/23/2024   Dextroscoliosis of lumbar spine (L3-4) 06/23/2024   DDD of lumbosacral region w/ LBP & LEP 06/23/2024   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) 06/23/2024   Osteoarthritis of lumbar spine 06/23/2024   Pharmacologic therapy 06/22/2024   Disorder of skeletal system 06/22/2024   Problems influencing health status 06/22/2024   Functional heart murmur 05/24/2024   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) 03/07/2024   Hypoxemic respiratory failure, chronic (HCC) 09/10/2022   Obstructive sleep apnea 05/29/2022   Chronic diastolic CHF (congestive heart failure), NYHA class 2 (HCC) 04/18/2022   Anemia    History of laparoscopic appendectomy     Screening for osteoporosis 03/12/2022   Polyp of colon 12/03/2021   Abnormal mammogram 09/21/2021   Encounter for screening for nutritional disorder 04/07/2020   Hearing loss 04/07/2020   Mixed hyperlipidemia 04/07/2020   Shoulder pain (Right) 04/07/2020   Elevated LFTs 09/28/2019   Insomnia due to medical condition 08/24/2019   Hypoxia 08/20/2019   Essential hypertension 08/20/2019   Depression 08/20/2019   Disease due to severe acute respiratory syndrome coronavirus 2 (SARS-CoV-2) 08/20/2019   Hyperlipidemia due to type 2 diabetes mellitus (HCC) 08/20/2019   Status post total hysterectomy 08/20/2019   Type 2 diabetes mellitus with diabetic polyneuropathy, with long-term current use of insulin  (HCC) 08/20/2019   Cough, unspecified 11/27/2018   Disorder associated with type 2 diabetes mellitus (HCC) 11/27/2018   Type 2 diabetes mellitus without complications (HCC) 01/06/2017   Major depressive disorder, recurrent episode, mild degree (HCC) 01/06/2017   Chronic pain syndrome 04/08/2016   Chronic low back pain (Bilateral) (R>L) w/ sciatica (Right) 04/08/2016   Peripheral neuropathy 02/15/2015   Arthritis 08/10/2012   Esophageal reflux 08/10/2012    PCP: Autry Grayce LABOR, PA   REFERRING PROVIDER: Rocco Organ Dipakkumar, MD  REFERRING DIAG: M51.369 (ICD-10-CM) - Other intervertebral disc degeneration, lumbar region without mention of lumbar back pain or lower extremity  pain  Rationale for Evaluation and Treatment: Rehabilitation  THERAPY DIAG:  Other low back pain  Radiculopathy, lumbar region  Difficulty in walking, not elsewhere classified  ONSET DATE: December 2024  SUBJECTIVE:                                                                                                                                                                                           SUBJECTIVE STATEMENT:  Back is better, 4/10 R low back and anterior leg pain currently. Going to pain  clinic 08/03/2024 to get an epidural for her back.  Doing chores is better for her back.     PERTINENT HISTORY:  Low back pain. Currently wearing a back brace which is not helping. Pt states having a bulging disc. Pain started December 2024, sudden onset, unknown mechanism of injury. Pt states having R Sciatic nerve in R posterior hip pain initially. Had x-ryas done, then and MRI which revealed bulging disc. Pain radiates along the R L5 dermatome as well as sciatic nerve (to superficial peroneal nerve distribution) to dorsal foot. R anterior ankle hurts. Pain has worsened since January 2025. Surgeon wants pt to do PT.      No latex allergies   Had L eye surgery 03/17/2024, pt states she is clear to participate in PT. 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  Blood pressure is controlled per pt.  DM is controlled per pt.  Has a CPAP machine.     PAIN:  Are you having pain? Yes: NPRS scale: 4/10 Pain location: Low back and R L5 dermatome and R sciatic nerve distribution to R dorsal foot.  Pain description: Low back: pressure; R LE: ache, tight Aggravating factors: Sleeping on her R side, standing and walking Relieving factors: leaning to the L, sleeping on her L side, leaning forward, wearing her back brace  PRECAUTIONS: Cannot see with L eye.     RED FLAGS: Bowel or bladder incontinence: No and Cauda equina syndrome: No   WEIGHT BEARING RESTRICTIONS: No  FALLS:  Has patient fallen in last 6 months? No  LIVING ENVIRONMENT: Lives with: lives with their spouse Lives in: House/apartment Stairs: Yes: Internal: 2 steps; none and External: 0 steps; none Has following equipment at home: Single point cane  OCCUPATION: Housewife  PLOF: Independent  PATIENT GOALS: decrease pain  NEXT MD VISIT: none at the moment  OBJECTIVE:  Note: Objective measures were completed at Evaluation unless otherwise noted.  DIAGNOSTIC FINDINGS:    PATIENT SURVEYS:  Modified Oswestry Low Back Pain  Disability Questionnaire 62% (04/22/2024)  COGNITION: Overall cognitive status: Within functional limits for tasks assessed  SENSATION:   MUSCLE LENGTH:   POSTURE: forward neck, B protracted shoulders, L trunk side bend, movement preference around L4/L5 area, L weight shifting.   PALPATION: TTP R low back, increased B lumbar paraspinal muscle tension R > L  TTP R L5 > L4 > L3 TTP R low back > L side     LUMBAR ROM:   AROM eval  Flexion WFL, R posterior hip pain when returning to the upright position  Extension Very limited with R posterior hip pain  Right lateral flexion Limited with R lateral hip joint pain (worse than L lateral flexion)  Left lateral flexion Limited with R lateral hip pain  Right rotation [facility] with R posterior lateral hip pain worse than L rotation  Left rotation WFL with R posterior lateral hip pain.    (Blank rows = not tested)  LOWER EXTREMITY ROM:     Passive  Right 05/31/2024 Left 05/31/2024  Hip flexion    Hip extension    Hip abduction    Hip adduction    Hip internal rotation 37 18  Hip external rotation 37 with R low back pain (different pain) 34  Knee flexion    Knee extension    Ankle dorsiflexion    Ankle plantarflexion    Ankle inversion    Ankle eversion     (Blank rows = not tested)  LOWER EXTREMITY MMT:    MMT Right eval Left eval  Hip flexion    Hip extension (seated manually resisted) 3+ 3+  Hip abduction (seated manually resisted) 4- 4-  Hip adduction    Hip internal rotation    Hip external rotation    Knee flexion 4 4  Knee extension 4+ 4+  Ankle dorsiflexion    Ankle plantarflexion    Ankle inversion    Ankle eversion     (Blank rows = not tested)  LUMBAR SPECIAL TESTS:    FUNCTIONAL TESTS:    GAIT: Distance walked: 30 ft Assistive device utilized: None Level of assistance: SBA Comments: Decreased stance R LE, antalgic with R trunk side bend during R LE stance phase, decreased B knee extension  during stance phase, decreased foot clearance and gait velocity.   TREATMENT DATE: 07/28/2024     There.Act:   Standing B shoulder extension GTB 3x10. VC's for form/technique.   Standing scap retraction with GTB: 2x10 with 5 second holds. VC's for slowed completion of reps.   Standing with B UE assist   Hip abduction: 3x10/side. 2# AW's.  Hip extension: 3x10/side. 2# AW's.   Ambulating laterally along mat table with BUE support over x2 hurdles: 4 laps down/back x2. Seated rest b/t sets.   Seated paloff press anti rotation: 2x12R/L with GTB   Side stepping with yellow band around knees 30 ft to the R. Low back symptoms.       Improved exercise technique, movement at target joints, use of target muscles after mod verbal, visual, tactile cues.     PATIENT EDUCATION:  Education details: there-ex, HEP, POC Person educated: Patient Education method: Explanation, Demonstration, Tactile cues, Verbal cues, and Handouts Education comprehension: verbalized understanding and returned demonstration  HOME EXERCISE PROGRAM: Access Code: [email or web address] URL: [email or web address] Date: 04/22/2024 Prepared by: Emil Glassman  Exercises - Seated Transversus Abdominis Bracing  - 3 x daily - 7 x weekly - 3 sets - 10 reps - 5 seconds hold  Upgraded to standing on (06/15/2024) - Seated Gluteal Sets  - 3 x daily - 7 x weekly -  3 sets - 10 reps - 5 seconds hold  Upgraded to standing on (06/15/2024)  Reclined, hooklying hip extension isometrics with leg straight  R 10x5 seconds for 3 sets daily.  Drawing provided.      - Supine Transversus Abdominis Bracing - Hands on Stomach  - 5 x daily - 7 x weekly - 3 sets - 10 reps - 5 seconds hold - Supine Posterior Pelvic Tilt  - 2 x daily - 7 x weekly - 3 sets - 5 reps - 15 seconds hold  - Bent Knee Fallouts with Alternating Legs  - 1 x daily - 7 x weekly - 3 sets - 5 reps  - Supine Piriformis Stretch with Towel  - 3 x daily - 7 x weekly - 1  sets - 5 reps - 30 seconds hold   Sitting with lumbar towel roll 2 min  - Seated Flexion Stretch  - 3 x daily - 7 x weekly - 1 sets - 5 reps - 30 seconds hold  - Standing Gluteal Sets  - 1 x daily - 7 x weekly - 3 sets - 10 reps - 10 seconds hold - Standing Transverse Abdominis Contraction  - 1 x daily - 7 x weekly - 3 sets - 10 reps - 10 seconds hold  - Forward Step Up with Counter Support  - 1 x daily - 7 x weekly - 3 sets - 10 reps   Seated B shoulder extension isometrics with hands on knees 5x5 seconds  - Supine Bridge  - 1 x daily - 7 x weekly - 2-3 sets - 5 reps  - Hooklying Clamshell with Resistance  - 1 x daily - 7 x weekly - 3 sets - 10 reps  Green band  - Shoulder Extension with Resistance - Palms Forward  - 1 x daily - 7 x weekly - 3 sets - 10 reps - 5 seconds hold  Red band    ASSESSMENT:  CLINICAL IMPRESSION: Continued working on improving trunk, glute med and max strength to decrease stress to low back when performing standing tasks. Good muscle use felt with exercises. Pt tolerated session well without aggravation of symptoms. Pt will benefit from continued skilled physical therapy services to decrease pain, improve strength and function.       OBJECTIVE IMPAIRMENTS: Abnormal gait, decreased balance, difficulty walking, decreased strength, improper body mechanics, postural dysfunction, and pain.   ACTIVITY LIMITATIONS: carrying, lifting, bending, sitting, standing, squatting, sleeping, stairs, transfers, bed mobility, dressing, hygiene/grooming, and locomotion level  PARTICIPATION LIMITATIONS:   PERSONAL FACTORS: Age, Fitness, Time since onset of injury/illness/exacerbation, and 3+ comorbidities: anxiety, arthritis, depression, DM, HTN, sleep apnea are also affecting patient's functional outcome.   REHAB POTENTIAL: Fair    CLINICAL DECISION MAKING: Stable/uncomplicated Pain has worsened since onset per pt.   EVALUATION COMPLEXITY: Low   GOALS: Goals  reviewed with patient? Yes  SHORT TERM GOALS: Target date: 05/07/2024  Pt will be independent with her initial HEP to decrease pain, improve strength, function, and ability to perform standing tasks more comfortably.  Baseline: Pt has started her initial HEP (04/22/2024); doing her HEP, no questions (06/02/2024) Goal status: MET   LONG TERM GOALS: Target date: 07/16/2024  Pt will have a decrease in low back pain to 4/10 or less at worst to promote ability to perform standing tasks more comfortably.  Baseline:8/10 low back pain at worst for the past 3 months (04/22/2024); 5/10 back pain at most for the past 7 days,  also not currently wearing her back brace (06/02/2024), 5/10 at worst but when she vacuums, back pain goes up to a 6/10. Currently no longer using her back brace (06/07/2024); 6/10 at worst for the past 7 days (06/15/2024); 5/10 at most for the past 7 days (07/05/2024); 07/12/24: 5/10 NPS consistently Goal status: PROGRESSING  2.  Pt will have a decrease in R LE pain to 4/10 or less at worst to promote ability to ambulate and perform standing tasks more comfortably.  Baseline: 9/10 R LE pain at worst for the past 3 months. (04/22/2024); 5/10 R LE pain at most for the past 7 days, does not hurt often like it used to (06/02/2024), 5/10 at most but when she vacuums, R LE pain goes up to a 6/10. Currently no longer using her back brace (06/07/2024); 6/10 at most for the past 7 days. (07/05/2024);  Goal status: PROGRESSING  3.  Pt will improve her Modified Oswestry Low Back Pain Disability Questionnaire by at least 20% as a demonstration of improved function.  Baseline: Modified Oswestry Low Back Pain Disability Questionnaire 62% (04/22/2024); 46 % (06/07/2024); 07/12/24: 36% Goal status: PROGRESSING  4.  Pt will improve her B hip extension and abduction strength by at least 1/2 MMT grade to promote ability to ambulate and perform standing tasks more comfortably.  Baseline:  MMT Right eval Left eval  R (06/07/2024) L (06/07/2024)  Hip extension (seated manually resisted) 3+ 3+ 4+ 4  Hip abduction (seated manually resisted) 4- 4- 4+ 4+   (04/22/2024);  Goal status: MET   PLAN:  PT FREQUENCY: 1-2x/week  PT DURATION: 4 weeks  PLANNED INTERVENTIONS: 97110-Therapeutic exercises, 97530- Therapeutic activity, 97112- Neuromuscular re-education, [phone]- Self Care, 02859- Manual therapy, [phone]- Gait training, [phone]- Aquatic Therapy, [phone]- Electrical stimulation (unattended), [phone]- Traction (mechanical), [id number]- Ionotophoresis 4mg /ml Dexamethasone , Patient/Family education, Balance training, Stair training, Dry Needling, Joint mobilization, and Spinal mobilization.  PLAN FOR NEXT SESSION: Posture, trunk and glute strengthening, manual techniques, modalities PRN    Emil Glassman PT, DPT Physical Therapist- [facility]  [facility] 07/29/2024, 4:13 PM

## 2024-07-30 ENCOUNTER — Ambulatory Visit

## 2024-07-30 DIAGNOSIS — R262 Difficulty in walking, not elsewhere classified: Secondary | ICD-10-CM

## 2024-07-30 DIAGNOSIS — M5459 Other low back pain: Secondary | ICD-10-CM

## 2024-07-30 DIAGNOSIS — M5416 Radiculopathy, lumbar region: Secondary | ICD-10-CM

## 2024-07-30 NOTE — Therapy (Signed)
 OUTPATIENT PHYSICAL THERAPY TREATMENT   Patient Name: Emma White MRN: [id number] DOB:12/19/1955, 68 y.o., female Today's Date: 07/30/2024  END OF SESSION:  PT End of Session - 07/30/24 0734     Visit Number 23    Number of Visits 25    Date for PT Re-Evaluation 08/13/24    PT Start Time 0734    PT Stop Time 0820    PT Time Calculation (min) 46 min    Activity Tolerance Patient tolerated treatment well    Behavior During Therapy [facility] for tasks assessed/performed               Past Medical History:  Diagnosis Date   Anxiety    Arthritis    hand   Complication of anesthesia 03/18/2022   Hypoxia in pacu. See pacu note 03/18/2022. The hospitalist was consulted for further work-up.   Depression    Diabetes mellitus without complication (HCC)    Dyspnea    DOE   Hyperlipemia    Hypertension    Neuropathy    Sleep apnea    Past Surgical History:  Procedure Laterality Date   ABDOMINAL HYSTERECTOMY  1995   APPENDECTOMY     BIOPSY  07/10/2023   Procedure: BIOPSY;  Surgeon: Onita Elspeth Sharper, DO;  Location: [facility] ENDOSCOPY;  Service: Gastroenterology;;   CATARACT EXTRACTION W/PHACO Left 09/25/2017   Procedure: CATARACT EXTRACTION PHACO AND INTRAOCULAR LENS PLACEMENT (IOC);  Surgeon: Myrna Adine Anes, MD;  Location: [facility] ORS;  Service: Ophthalmology;  Laterality: Left;   flui pack lot # [id number]  US  00:32.7 AP%   12.17 CDE   3.95   CATARACT EXTRACTION W/PHACO Right 10/16/2017   Procedure: CATARACT EXTRACTION PHACO AND INTRAOCULAR LENS PLACEMENT (IOC);  Surgeon: Myrna Adine Anes, MD;  Location: [facility] ORS;  Service: Ophthalmology;  Laterality: Right;  US  00:32.8 AP% 9.0 CDE 2.96 FLUID PACK LOT # [id number]   COLONOSCOPY WITH PROPOFOL  N/A 05/27/2016   Procedure: COLONOSCOPY WITH PROPOFOL ;  Surgeon: Lamar ONEIDA Holmes, MD;  Location: [facility] ENDOSCOPY;  Service: Endoscopy;  Laterality: N/A;   COLONOSCOPY WITH PROPOFOL  N/A 07/10/2021   Procedure: COLONOSCOPY WITH  PROPOFOL ;  Surgeon: Maryruth Ole ONEIDA, MD;  Location: [facility] ENDOSCOPY;  Service: Endoscopy;  Laterality: N/A;   COLONOSCOPY WITH PROPOFOL  N/A 10/30/2021   Procedure: COLONOSCOPY WITH PROPOFOL ;  Surgeon: Maryruth Ole ONEIDA, MD;  Location: [facility] ENDOSCOPY;  Service: Endoscopy;  Laterality: N/A;   COLONOSCOPY WITH PROPOFOL  N/A 02/05/2022   Procedure: COLONOSCOPY WITH PROPOFOL ;  Surgeon: Maryruth Ole ONEIDA, MD;  Location: [facility] ENDOSCOPY;  Service: Endoscopy;  Laterality: N/A;   COLONOSCOPY WITH PROPOFOL  N/A 07/10/2023   Procedure: COLONOSCOPY WITH PROPOFOL ;  Surgeon: Onita Elspeth Sharper, DO;  Location: [facility] ENDOSCOPY;  Service: Gastroenterology;  Laterality: N/A;   ESOPHAGOGASTRODUODENOSCOPY (EGD) WITH PROPOFOL  N/A 07/10/2021   Procedure: ESOPHAGOGASTRODUODENOSCOPY (EGD) WITH PROPOFOL ;  Surgeon: Maryruth Ole ONEIDA, MD;  Location: [facility] ENDOSCOPY;  Service: Endoscopy;  Laterality: N/A;  IDDM   ESOPHAGOGASTRODUODENOSCOPY (EGD) WITH PROPOFOL  N/A 10/30/2021   Procedure: ESOPHAGOGASTRODUODENOSCOPY (EGD) WITH PROPOFOL ;  Surgeon: Maryruth Ole ONEIDA, MD;  Location: [facility] ENDOSCOPY;  Service: Endoscopy;  Laterality: N/A;  IDDM   EYE SURGERY Left 09/25/2017   cataract extraction   HYSTERECTOMY SUPRACERVICAL ABDOMINAL W/REMOVAL TUBES &/OR OVARIES  1995   KENALOG  INJECTION Right 10/16/2017   Procedure: KENALOG  INJECTION;  Surgeon: Myrna Adine Anes, MD;  Location: [facility] ORS;  Service: Ophthalmology;  Laterality: Right;   KNEE ARTHROCENTESIS Left 1981   KNEE ARTHROCENTESIS Bilateral 1992   right  one in 1992   KNEE ARTHROCENTESIS Bilateral    POLYPECTOMY  07/10/2023   Procedure: POLYPECTOMY;  Surgeon: Onita Elspeth Sharper, DO;  Location: [facility] ENDOSCOPY;  Service: Gastroenterology;;   XI ROBOTIC LAPAROSCOPIC ASSISTED APPENDECTOMY N/A 03/18/2022   Procedure: XI ROBOTIC LAPAROSCOPIC ASSISTED APPENDECTOMY;  Surgeon: Rodolph Romano, MD;  Location: [facility] ORS;  Service: General;  Laterality: N/A;    Patient Active Problem List   Diagnosis Date Noted   Lumbar facet hypertrophy (Bilateral: L4-5, L5-S1) 07/12/2024   Lumbar facet joint syndrome 07/12/2024   Tricompartment osteoarthritis of knees (Bilateral) 07/12/2024   Chondrocalcinosis of knees (Bilateral) 07/12/2024   Chronic knee pain (Bilateral) (R>L) 06/23/2024   Chronic hip pain (Right) 06/23/2024   Impaired range of motion of hip (Right) 06/23/2024   Chronic lower extremity pain (Right) 06/23/2024   Impaired range of motion of knee (Right) 06/23/2024   Abnormal MRI, lumbar spine [facility]) (03/05/2024) 06/23/2024   Lumbosacral radiculopathy at L5 (Right) 06/23/2024   Chronic radicular pain of lower extremity (Right) 06/23/2024   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, L4-5) 06/23/2024   Foraminal stenosis of lumbosacral region (L5-S1) (SEVERE  on Right) 06/23/2024   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) 06/23/2024   Lumbar facet arthropathy (Bilateral: L2-3, L3-4, L4-5, L5-S1) 06/23/2024   Lumbar facet joint pain (Bilateral) 06/23/2024   Low back pain of over 3 months duration 06/23/2024   Low back pain potentially associated with radiculopathy 06/23/2024   Low back pain radiating to leg (Right) 06/23/2024   Multifactorial low back pain 06/23/2024   Dextroscoliosis of lumbar spine (L3-4) 06/23/2024   DDD of lumbosacral region w/ LBP & LEP 06/23/2024   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) 06/23/2024   Osteoarthritis of lumbar spine 06/23/2024   Pharmacologic therapy 06/22/2024   Disorder of skeletal system 06/22/2024   Problems influencing health status 06/22/2024   Functional heart murmur 05/24/2024   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) 03/07/2024   Hypoxemic respiratory failure, chronic (HCC) 09/10/2022   Obstructive sleep apnea 05/29/2022   Chronic diastolic CHF (congestive heart failure), NYHA class 2 (HCC) 04/18/2022   Anemia    History of laparoscopic  appendectomy    Screening for osteoporosis 03/12/2022   Polyp of colon 12/03/2021   Abnormal mammogram 09/21/2021   Encounter for screening for nutritional disorder 04/07/2020   Hearing loss 04/07/2020   Mixed hyperlipidemia 04/07/2020   Shoulder pain (Right) 04/07/2020   Elevated LFTs 09/28/2019   Insomnia due to medical condition 08/24/2019   Hypoxia 08/20/2019   Essential hypertension 08/20/2019   Depression 08/20/2019   Disease due to severe acute respiratory syndrome coronavirus 2 (SARS-CoV-2) 08/20/2019   Hyperlipidemia due to type 2 diabetes mellitus (HCC) 08/20/2019   Status post total hysterectomy 08/20/2019   Type 2 diabetes mellitus with diabetic polyneuropathy, with long-term current use of insulin  (HCC) 08/20/2019   Cough, unspecified 11/27/2018   Disorder associated with type 2 diabetes mellitus (HCC) 11/27/2018   Type 2 diabetes mellitus without complications (HCC) 01/06/2017   Major depressive disorder, recurrent episode, mild degree (HCC) 01/06/2017   Chronic pain syndrome 04/08/2016   Chronic low back pain (Bilateral) (R>L) w/ sciatica (Right) 04/08/2016   Peripheral neuropathy 02/15/2015   Arthritis 08/10/2012   Esophageal reflux 08/10/2012    PCP: Autry Grayce LABOR, PA   REFERRING PROVIDER: Rocco Organ Dipakkumar, MD  REFERRING DIAG: M51.369 (ICD-10-CM) - Other intervertebral disc degeneration, lumbar region without mention of lumbar back pain or lower  extremity pain  Rationale for Evaluation and Treatment: Rehabilitation  THERAPY DIAG:  Other low back pain  Radiculopathy, lumbar region  Difficulty in walking, not elsewhere classified  ONSET DATE: December 2024  SUBJECTIVE:                                                                                                                                                                                           SUBJECTIVE STATEMENT:  Back hurts. 4/10 currently R low back, no R LE pain currently  but had R LE pain yesterday.   PERTINENT HISTORY:  Low back pain. Currently wearing a back brace which is not helping. Pt states having a bulging disc. Pain started December 2024, sudden onset, unknown mechanism of injury. Pt states having R Sciatic nerve in R posterior hip pain initially. Had x-ryas done, then and MRI which revealed bulging disc. Pain radiates along the R L5 dermatome as well as sciatic nerve (to superficial peroneal nerve distribution) to dorsal foot. R anterior ankle hurts. Pain has worsened since January 2025. Surgeon wants pt to do PT.      No latex allergies   Had L eye surgery 03/17/2024, pt states she is clear to participate in PT. 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  Blood pressure is controlled per pt.  DM is controlled per pt.  Has a CPAP machine.     PAIN:  Are you having pain? Yes: NPRS scale: 4/10 Pain location: Low back and R L5 dermatome and R sciatic nerve distribution to R dorsal foot.  Pain description: Low back: pressure; R LE: ache, tight Aggravating factors: Sleeping on her R side, standing and walking Relieving factors: leaning to the L, sleeping on her L side, leaning forward, wearing her back brace  PRECAUTIONS: Cannot see with L eye.     RED FLAGS: Bowel or bladder incontinence: No and Cauda equina syndrome: No   WEIGHT BEARING RESTRICTIONS: No  FALLS:  Has patient fallen in last 6 months? No  LIVING ENVIRONMENT: Lives with: lives with their spouse Lives in: House/apartment Stairs: Yes: Internal: 2 steps; none and External: 0 steps; none Has following equipment at home: Single point cane  OCCUPATION: Housewife  PLOF: Independent  PATIENT GOALS: decrease pain  NEXT MD VISIT: none at the moment  OBJECTIVE:  Note: Objective measures were completed at Evaluation unless otherwise noted.  DIAGNOSTIC FINDINGS:    PATIENT SURVEYS:  Modified Oswestry Low Back Pain Disability Questionnaire 62% (04/22/2024)  COGNITION: Overall  cognitive status: Within functional limits for tasks assessed     SENSATION:   MUSCLE LENGTH:   POSTURE: forward neck, B protracted  shoulders, L trunk side bend, movement preference around L4/L5 area, L weight shifting.   PALPATION: TTP R low back, increased B lumbar paraspinal muscle tension R > L  TTP R L5 > L4 > L3 TTP R low back > L side     LUMBAR ROM:   AROM eval  Flexion WFL, R posterior hip pain when returning to the upright position  Extension Very limited with R posterior hip pain  Right lateral flexion Limited with R lateral hip joint pain (worse than L lateral flexion)  Left lateral flexion Limited with R lateral hip pain  Right rotation [facility] with R posterior lateral hip pain worse than L rotation  Left rotation WFL with R posterior lateral hip pain.    (Blank rows = not tested)  LOWER EXTREMITY ROM:     Passive  Right 05/31/2024 Left 05/31/2024  Hip flexion    Hip extension    Hip abduction    Hip adduction    Hip internal rotation 37 18  Hip external rotation 37 with R low back pain (different pain) 34  Knee flexion    Knee extension    Ankle dorsiflexion    Ankle plantarflexion    Ankle inversion    Ankle eversion     (Blank rows = not tested)  LOWER EXTREMITY MMT:    MMT Right eval Left eval  Hip flexion    Hip extension (seated manually resisted) 3+ 3+  Hip abduction (seated manually resisted) 4- 4-  Hip adduction    Hip internal rotation    Hip external rotation    Knee flexion 4 4  Knee extension 4+ 4+  Ankle dorsiflexion    Ankle plantarflexion    Ankle inversion    Ankle eversion     (Blank rows = not tested)  LUMBAR SPECIAL TESTS:    FUNCTIONAL TESTS:    GAIT: Distance walked: 30 ft Assistive device utilized: None Level of assistance: SBA Comments: Decreased stance R LE, antalgic with R trunk side bend during R LE stance phase, decreased B knee extension during stance phase, decreased foot clearance and gait velocity.    TREATMENT DATE: 07/30/2024     There.Act:  Performed with the intent of improving ability to perform chores at home such as sweeping, mopping, and vacuuming with less back pain.    Standing B shoulder extension GTB 3x10. VC's for form/technique.   Standing scap retraction with GTB: 2x10 with 5 second holds. VC's for slowed completion of reps.   Standing with transversus abdominis contraction   With 25 lb squat bar with landmine tip on the bottom   R and L lateral holds 8x5 seconds each side    Emphasis on trunk and pelvic stability.     Performted to promote ability to perform standing chores such as sweeping and mopping.    R low back symptoms   Standing PNF chop to the  L 5x R 10x (more comfortable compared to performing for L side)   Standing R shoulder extension green band 10x2  Forward step up onto first regular step with B UE assist   L 8x, then 10x2  R 8x, then 10x2  To promote glute max strength   Decreased back pain reported.    Standing B shoulder adduction at squat rack (for band attachment)  Red band 10x3  Back feels better per pt, exercise helps       Improved exercise technique, movement at target joints, use of target muscles after  mod verbal, visual, tactile cues.     PATIENT EDUCATION:  Education details: there-ex, HEP, POC Person educated: Patient Education method: Explanation, Demonstration, Tactile cues, Verbal cues, and Handouts Education comprehension: verbalized understanding and returned demonstration  HOME EXERCISE PROGRAM: Access Code: [email or web address] URL: [email or web address] Date: 04/22/2024 Prepared by: Emil Glassman  Exercises - Seated Transversus Abdominis Bracing  - 3 x daily - 7 x weekly - 3 sets - 10 reps - 5 seconds hold  Upgraded to standing on (06/15/2024) - Seated Gluteal Sets  - 3 x daily - 7 x weekly - 3 sets - 10 reps - 5 seconds hold  Upgraded to standing on (06/15/2024)  Reclined, hooklying hip extension  isometrics with leg straight  R 10x5 seconds for 3 sets daily.  Drawing provided.      - Supine Transversus Abdominis Bracing - Hands on Stomach  - 5 x daily - 7 x weekly - 3 sets - 10 reps - 5 seconds hold - Supine Posterior Pelvic Tilt  - 2 x daily - 7 x weekly - 3 sets - 5 reps - 15 seconds hold  - Bent Knee Fallouts with Alternating Legs  - 1 x daily - 7 x weekly - 3 sets - 5 reps  - Supine Piriformis Stretch with Towel  - 3 x daily - 7 x weekly - 1 sets - 5 reps - 30 seconds hold   Sitting with lumbar towel roll 2 min  - Seated Flexion Stretch  - 3 x daily - 7 x weekly - 1 sets - 5 reps - 30 seconds hold  - Standing Gluteal Sets  - 1 x daily - 7 x weekly - 3 sets - 10 reps - 10 seconds hold - Standing Transverse Abdominis Contraction  - 1 x daily - 7 x weekly - 3 sets - 10 reps - 10 seconds hold  - Forward Step Up with Counter Support  - 1 x daily - 7 x weekly - 3 sets - 10 reps   Seated B shoulder extension isometrics with hands on knees 5x5 seconds  - Supine Bridge  - 1 x daily - 7 x weekly - 2-3 sets - 5 reps  - Hooklying Clamshell with Resistance  - 1 x daily - 7 x weekly - 3 sets - 10 reps  Green band  - Shoulder Extension with Resistance - Palms Forward  - 1 x daily - 7 x weekly - 3 sets - 10 reps - 5 seconds hold  Red band  - Shoulder Adduction with Anchored Resistance  - 1 x daily - 7 x weekly - 3 sets - 10 reps  ASSESSMENT:  CLINICAL IMPRESSION: Worked on lateral trunk and B glute strength to help promote trunk stability in the coronal plane.  No back pain reported after session. Pt states the standing B shoulder adduction helped.  Good muscle use felt with exercises. Pt tolerated session well without aggravation of symptoms. Pt will benefit from continued skilled physical therapy services to decrease pain, improve strength and function.       OBJECTIVE IMPAIRMENTS: Abnormal gait, decreased balance, difficulty walking, decreased strength, improper body  mechanics, postural dysfunction, and pain.   ACTIVITY LIMITATIONS: carrying, lifting, bending, sitting, standing, squatting, sleeping, stairs, transfers, bed mobility, dressing, hygiene/grooming, and locomotion level  PARTICIPATION LIMITATIONS:   PERSONAL FACTORS: Age, Fitness, Time since onset of injury/illness/exacerbation, and 3+ comorbidities: anxiety, arthritis, depression, DM, HTN, sleep apnea are also affecting patient's functional outcome.   REHAB POTENTIAL: Fair  CLINICAL DECISION MAKING: Stable/uncomplicated Pain has worsened since onset per pt.   EVALUATION COMPLEXITY: Low   GOALS: Goals reviewed with patient? Yes  SHORT TERM GOALS: Target date: 05/07/2024  Pt will be independent with her initial HEP to decrease pain, improve strength, function, and ability to perform standing tasks more comfortably.  Baseline: Pt has started her initial HEP (04/22/2024); doing her HEP, no questions (06/02/2024) Goal status: MET   LONG TERM GOALS: Target date: 07/16/2024  Pt will have a decrease in low back pain to 4/10 or less at worst to promote ability to perform standing tasks more comfortably.  Baseline:8/10 low back pain at worst for the past 3 months (04/22/2024); 5/10 back pain at most for the past 7 days, also not currently wearing her back brace (06/02/2024), 5/10 at worst but when she vacuums, back pain goes up to a 6/10. Currently no longer using her back brace (06/07/2024); 6/10 at worst for the past 7 days (06/15/2024); 5/10 at most for the past 7 days (07/05/2024); 07/12/24: 5/10 NPS consistently Goal status: PROGRESSING  2.  Pt will have a decrease in R LE pain to 4/10 or less at worst to promote ability to ambulate and perform standing tasks more comfortably.  Baseline: 9/10 R LE pain at worst for the past 3 months. (04/22/2024); 5/10 R LE pain at most for the past 7 days, does not hurt often like it used to (06/02/2024), 5/10 at most but when she vacuums, R LE pain goes up to a 6/10.  Currently no longer using her back brace (06/07/2024); 6/10 at most for the past 7 days. (07/05/2024);  Goal status: PROGRESSING  3.  Pt will improve her Modified Oswestry Low Back Pain Disability Questionnaire by at least 20% as a demonstration of improved function.  Baseline: Modified Oswestry Low Back Pain Disability Questionnaire 62% (04/22/2024); 46 % (06/07/2024); 07/12/24: 36% Goal status: PROGRESSING  4.  Pt will improve her B hip extension and abduction strength by at least 1/2 MMT grade to promote ability to ambulate and perform standing tasks more comfortably.  Baseline:  MMT Right eval Left eval R (06/07/2024) L (06/07/2024)  Hip extension (seated manually resisted) 3+ 3+ 4+ 4  Hip abduction (seated manually resisted) 4- 4- 4+ 4+   (04/22/2024);  Goal status: MET   PLAN:  PT FREQUENCY: 1-2x/week  PT DURATION: 4 weeks  PLANNED INTERVENTIONS: 97110-Therapeutic exercises, 97530- Therapeutic activity, 97112- Neuromuscular re-education, [phone]- Self Care, [id number]- Manual therapy, [phone]- Gait training, [phone]- Aquatic Therapy, [phone]- Electrical stimulation (unattended), [phone]- Traction (mechanical), [id number]- Ionotophoresis 4mg /ml Dexamethasone , Patient/Family education, Balance training, Stair training, Dry Needling, Joint mobilization, and Spinal mobilization.  PLAN FOR NEXT SESSION: Posture, trunk and glute strengthening, manual techniques, modalities PRN    Emil Glassman PT, DPT Physical Therapist- [facility]  [facility] 07/30/2024, 11:50 AM

## 2024-08-02 ENCOUNTER — Encounter: Payer: Self-pay | Admitting: Pain Medicine

## 2024-08-03 ENCOUNTER — Encounter

## 2024-08-03 ENCOUNTER — Encounter: Payer: Self-pay | Admitting: Pain Medicine

## 2024-08-03 ENCOUNTER — Ambulatory Visit (HOSPITAL_BASED_OUTPATIENT_CLINIC_OR_DEPARTMENT_OTHER): Admitting: Pain Medicine

## 2024-08-03 ENCOUNTER — Ambulatory Visit
Admission: RE | Admit: 2024-08-03 | Discharge: 2024-08-03 | Disposition: A | Discharge status: Home / Self Care | Source: Ambulatory Visit | Attending: Pain Medicine | Admitting: Pain Medicine

## 2024-08-03 VITALS — BP 110/60 | HR 81 | Temp 97.6°F | Resp 18 | Ht 60.0 in | Wt 144.0 lb

## 2024-08-03 DIAGNOSIS — M79604 Pain in right leg: Secondary | ICD-10-CM | POA: Diagnosis present

## 2024-08-03 DIAGNOSIS — M5417 Radiculopathy, lumbosacral region: Secondary | ICD-10-CM | POA: Insufficient documentation

## 2024-08-03 DIAGNOSIS — M545 Low back pain, unspecified: Secondary | ICD-10-CM | POA: Insufficient documentation

## 2024-08-03 DIAGNOSIS — M5410 Radiculopathy, site unspecified: Secondary | ICD-10-CM | POA: Insufficient documentation

## 2024-08-03 DIAGNOSIS — M5126 Other intervertebral disc displacement, lumbar region: Secondary | ICD-10-CM | POA: Insufficient documentation

## 2024-08-03 DIAGNOSIS — M47816 Spondylosis without myelopathy or radiculopathy, lumbar region: Secondary | ICD-10-CM

## 2024-08-03 DIAGNOSIS — M51372 Other intervertebral disc degeneration, lumbosacral region with discogenic back pain and lower extremity pain: Secondary | ICD-10-CM | POA: Diagnosis present

## 2024-08-03 DIAGNOSIS — M25561 Pain in right knee: Secondary | ICD-10-CM | POA: Insufficient documentation

## 2024-08-03 DIAGNOSIS — M48061 Spinal stenosis, lumbar region without neurogenic claudication: Secondary | ICD-10-CM | POA: Diagnosis present

## 2024-08-03 DIAGNOSIS — M4807 Spinal stenosis, lumbosacral region: Secondary | ICD-10-CM | POA: Diagnosis present

## 2024-08-03 DIAGNOSIS — M5441 Lumbago with sciatica, right side: Secondary | ICD-10-CM

## 2024-08-03 DIAGNOSIS — R937 Abnormal findings on diagnostic imaging of other parts of musculoskeletal system: Secondary | ICD-10-CM | POA: Diagnosis present

## 2024-08-03 DIAGNOSIS — M25562 Pain in left knee: Secondary | ICD-10-CM | POA: Insufficient documentation

## 2024-08-03 DIAGNOSIS — G8929 Other chronic pain: Secondary | ICD-10-CM | POA: Diagnosis present

## 2024-08-03 DIAGNOSIS — M25551 Pain in right hip: Secondary | ICD-10-CM | POA: Insufficient documentation

## 2024-08-03 MED ORDER — LIDOCAINE HCL 2 % IJ SOLN
INTRAMUSCULAR | Status: AC
  Filled 2024-08-03: qty 20

## 2024-08-03 MED ORDER — MIDAZOLAM HCL 5 MG/5ML IJ SOLN
INTRAMUSCULAR | Status: AC
  Filled 2024-08-03: qty 5

## 2024-08-03 MED ORDER — ROPIVACAINE HCL 2 MG/ML IJ SOLN
2.0000 mL | Freq: Once | INTRAMUSCULAR | Status: AC
  Administered 2024-08-03: 20 mL via EPIDURAL

## 2024-08-03 MED ORDER — IOHEXOL 180 MG/ML  SOLN
INTRAMUSCULAR | Status: AC
  Filled 2024-08-03: qty 10

## 2024-08-03 MED ORDER — PENTAFLUOROPROP-TETRAFLUOROETH EX AERO
INHALATION_SPRAY | Freq: Once | CUTANEOUS | Status: AC
  Administered 2024-08-03: 30 via TOPICAL

## 2024-08-03 MED ORDER — LIDOCAINE HCL 2 % IJ SOLN
20.0000 mL | Freq: Once | INTRAMUSCULAR | Status: AC
  Administered 2024-08-03: 400 mg

## 2024-08-03 MED ORDER — SODIUM CHLORIDE 0.9% FLUSH
2.0000 mL | Freq: Once | INTRAVENOUS | Status: AC
  Administered 2024-08-03: 10 mL

## 2024-08-03 MED ORDER — SODIUM CHLORIDE (PF) 0.9 % IJ SOLN
INTRAMUSCULAR | Status: AC
  Filled 2024-08-03: qty 10

## 2024-08-03 MED ORDER — FENTANYL CITRATE (PF) 100 MCG/2ML IJ SOLN: 25.0000 ug | INTRAMUSCULAR | Status: DC | PRN

## 2024-08-03 MED ORDER — FENTANYL CITRATE (PF) 100 MCG/2ML IJ SOLN
INTRAMUSCULAR | Status: AC
  Filled 2024-08-03: qty 2

## 2024-08-03 MED ORDER — TRIAMCINOLONE ACETONIDE 40 MG/ML IJ SUSP
INTRAMUSCULAR | Status: AC
Start: 2024-08-03 — End: 2024-08-03
  Filled 2024-08-03: qty 2

## 2024-08-03 MED ORDER — DEXAMETHASONE SODIUM PHOSPHATE 10 MG/ML IJ SOLN
INTRAMUSCULAR | Status: AC
  Filled 2024-08-03: qty 2

## 2024-08-03 MED ORDER — DEXAMETHASONE SODIUM PHOSPHATE 10 MG/ML IJ SOLN
20.0000 mg | Freq: Once | INTRAMUSCULAR | Status: AC
  Administered 2024-08-03: 10 mg

## 2024-08-03 MED ORDER — IOHEXOL 180 MG/ML  SOLN
10.0000 mL | Freq: Once | INTRAMUSCULAR | Status: AC
  Administered 2024-08-03: 10 mL via INTRATHECAL

## 2024-08-03 MED ORDER — ROPIVACAINE HCL 2 MG/ML IJ SOLN
INTRAMUSCULAR | Status: AC
  Filled 2024-08-03: qty 20

## 2024-08-03 MED ORDER — MIDAZOLAM HCL 5 MG/5ML IJ SOLN
0.5000 mg | Freq: Once | INTRAMUSCULAR | Status: AC
  Administered 2024-08-03: 2 mg via INTRAVENOUS

## 2024-08-03 NOTE — Progress Notes (Signed)
 Safety precautions to be maintained throughout the outpatient stay will include: orient to surroundings, keep bed in low position, maintain call bell within reach at all times, provide assistance with transfer out of bed and ambulation.

## 2024-08-03 NOTE — Progress Notes (Signed)
 PROVIDER NOTE: Interpretation of information contained herein should be left to medically-trained personnel. Specific patient instructions are provided elsewhere under Patient Instructions section of medical record. This document was created in part using STT-dictation technology, any transcriptional errors that may result from this process are unintentional.  Patient: Emma White Type: Established DOB: Jun 27, 1956 MRN: [id number] PCP: Autry Grayce LABOR, PA  Service: Procedure DOS: 08/03/2024 Setting: Ambulatory Location: Ambulatory outpatient facility Delivery: Face-to-face Provider: Eric LABOR Como, MD Specialty: Interventional Pain Management Specialty designation: 09 Location: Outpatient facility Ref. Prov.: Como Eric, MD       Interventional Therapy   Procedure: Lumbar trans-foraminal epidural steroid injection (L-TFESI) #1  Laterality: Right (-RT)  Level: L4 & L5 nerve root(s) Imaging: Fluoroscopy-guided Spinal ([id number]) Anesthesia: Local anesthesia (1-2% Lidocaine ) Anxiolysis: IV Versed          Sedation: Minimal Sedation                       DOS: 08/03/2024  Performed by: Eric LABOR Como, MD  Purpose: Diagnostic/Therapeutic Indications: Lumbar radicular pain severe enough to impact quality of life or function. 1. 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 Chronic lower extremity pain (Right)   3. Chronic radicular pain of lower extremity (Right)   4. 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 Foraminal stenosis of lumbosacral region (L5-S1) (SEVERE  on Right)   7. Chronic hip pain (Right)   8. 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 Low back pain of over 3 months duration   10. Low back pain potentially associated with radiculopathy   11. Low back pain radiating to leg (Right)   12. Lumbar facet hypertrophy (Bilateral: L4-5, L5-S1)   13. Lumbar  lateral recess stenosis (L4-5) (Right)   14. Lumbosacral radiculopathy at L5 (Right)    NAS-11 Pain score:   Pre-procedure: 3 /10   Post-procedure: 0-No pain/10     Position / Prep / Materials:  Position: Prone  Prep solution: ChloraPrep (2% chlorhexidine  gluconate and 70% isopropyl alcohol) Prep Area: Entire Posterior Lumbosacral Area.  From the lower tip of the scapula down to the tailbone and from flank to flank. Materials:  Tray: Block Needle(s):  Type: Spinal  Gauge (G): 22  Length: 5-in  Qty: 2     H&P (Pre-op Assessment):  Emma White is a 68 y.o. (year old), female patient, seen today for interventional treatment. She  has a past surgical history that includes Knee arthrocentesis (Left, 1981); Knee arthrocentesis (Bilateral, 1992); HYSTERECTOMY SUPRACERVICAL ABDOMINAL W/REMOVAL TUBES &/OR OVARIES (1995); Abdominal hysterectomy (1995); Knee arthrocentesis (Bilateral); Colonoscopy with propofol  (N/A, 05/27/2016); Cataract extraction w/PHACO (Left, 09/25/2017); Eye surgery (Left, 09/25/2017); Cataract extraction w/PHACO (Right, 10/16/2017); Kenolog injection (Right, 10/16/2017); Esophagogastroduodenoscopy (egd) with propofol  (N/A, 07/10/2021); Colonoscopy with propofol  (N/A, 07/10/2021); Esophagogastroduodenoscopy (egd) with propofol  (N/A, 10/30/2021); Colonoscopy with propofol  (N/A, 10/30/2021); Colonoscopy with propofol  (N/A, 02/05/2022); Xi robotic laparoscopic assisted appendectomy (N/A, 03/18/2022); Colonoscopy with propofol  (N/A, 07/10/2023); polypectomy (07/10/2023); biopsy (07/10/2023); and Appendectomy. Emma White has a current medication list which includes the following prescription(s): acetaminophen , amlodipine , aspirin , benzonatate, vitamin d3, citalopram , gabapentin , ibuprofen, metformin, pantoprazole , pravastatin , and semaglutide, and the following Facility-Administered Medications: fentanyl . Her primarily concern today is the Back Pain  Initial Vital Signs:  Pulse/HCG Rate:  81ECG Heart Rate: 68 Temp: 97.6 F (36.4 C) Resp: 18 BP: 129/70 SpO2: 97 %  BMI: Estimated body mass index is 28.12 kg/m as calculated from the following:   Height as of this encounter: 5' (1.524 m).   Weight as of this encounter: 144 lb (65.3 kg).  Risk Assessment: Allergies: Reviewed. She has no known allergies.  Allergy Precautions: None required Coagulopathies: Reviewed. None identified.  Blood-thinner therapy: None at this time Active Infection(s): Reviewed. None identified. Emma White is afebrile  Site Confirmation: Emma White was asked to confirm the procedure and laterality before marking the site Procedure checklist: Completed Consent: Before the procedure and under the influence of no sedative(s), amnesic(s), or anxiolytics, the patient was informed of the treatment options, risks and possible complications. To fulfill our ethical and legal obligations, as recommended by the American Medical Association's Code of Ethics, I have informed the patient of my clinical impression; the nature and purpose of the treatment or procedure; the risks, benefits, and possible complications of the intervention; the alternatives, including doing nothing; the risk(s) and benefit(s) of the alternative treatment(s) or procedure(s); and the risk(s) and benefit(s) of doing nothing. The patient was provided information about the general risks and possible complications associated with the procedure. These may include, but are not limited to: failure to achieve desired goals, infection, bleeding, organ or nerve damage, allergic reactions, paralysis, and death. In addition, the patient was informed of those risks and complications associated to Spine-related procedures, such as failure to decrease pain; infection (i.e.: Meningitis, epidural or intraspinal abscess); bleeding (i.e.: epidural hematoma, subarachnoid hemorrhage, or any other type of intraspinal or peri-dural bleeding); organ or nerve damage (i.e.:  Any type of peripheral nerve, nerve root, or spinal cord injury) with subsequent damage to sensory, motor, and/or autonomic systems, resulting in permanent pain, numbness, and/or weakness of one or several areas of the body; allergic reactions; (i.e.: anaphylactic reaction); and/or death. Furthermore, the patient was informed of those risks and complications associated with the medications. These include, but are not limited to: allergic reactions (i.e.: anaphylactic or anaphylactoid reaction(s)); adrenal axis suppression; blood sugar elevation that in diabetics may result in ketoacidosis or comma; water  retention that in patients with history of congestive heart failure may result in shortness of breath, pulmonary edema, and decompensation with resultant heart failure; weight gain; swelling or edema; medication-induced neural toxicity; particulate matter embolism and blood vessel occlusion with resultant organ, and/or nervous system infarction; and/or aseptic necrosis of one or more joints. Finally, the patient was informed that Medicine is not an exact science; therefore, there is also the possibility of unforeseen or unpredictable risks and/or possible complications that may result in a catastrophic outcome. The patient indicated having understood very clearly. We have given the patient no guarantees and we have made no promises. Enough time was given to the patient to ask questions, all of which were answered to the patient's satisfaction. Ms. Driskill has indicated that she wanted to continue with the procedure. Attestation: I, the ordering provider, attest that I have discussed with the patient the benefits, risks, side-effects, alternatives, likelihood of achieving goals, and potential problems during recovery for the procedure that I have provided informed consent. Date  Time: 08/03/2024  8:32 AM  Pre-Procedure Preparation:  Monitoring: As per clinic protocol. Respiration, ETCO2, SpO2, BP, heart rate and  rhythm monitor placed and checked for adequate function Safety Precautions: Patient was assessed for positional comfort and pressure points before starting the procedure. Time-out: I initiated and conducted the Time-out before starting the procedure, as per protocol. The patient was asked to participate by confirming  the accuracy of the Time Out information. Verification of the correct person, site, and procedure were performed and confirmed by me, the nursing staff, and the patient. Time-out conducted as per Joint Commission's Universal Protocol (UP.01.01.01). Time: 0925 Start Time: 0926 hrs.  Description/Narrative of Procedure:          Target: The 6 o'clock position under the pedicle, on the affected side. Region: Posterolateral Lumbosacral Approach: Posterior Percutaneous Paravertebral approach.  Rationale (medical necessity): procedure needed and proper for the diagnosis and/or treatment of the patient's medical symptoms and needs. Procedural Technique Safety Precautions: Aspiration looking for blood return was conducted prior to all injections. At no point did we inject any substances, as a needle was being advanced. No attempts were made at seeking any paresthesias. Safe injection practices and needle disposal techniques used. Medications properly checked for expiration dates. SDV (single dose vial) medications used. Description of the Procedure: Protocol guidelines were followed. The patient was placed in position over the procedure table. The target area was identified and the area prepped in the usual manner. Skin & deeper tissues infiltrated with local anesthetic. Appropriate amount of time allowed to pass for local anesthetics to take effect. The procedure needles were then advanced to the target area. Proper needle placement secured. Negative aspiration confirmed. Solution injected in intermittent fashion, asking for systemic symptoms every 0.5cc of injectate. The needles were then  removed and the area cleansed, making sure to leave some of the prepping solution back to take advantage of its long term bactericidal properties.  Vitals:   08/03/24 0831 08/03/24 0933 08/03/24 0938 08/03/24 0941  BP: 129/70 (!) 99/55 (!) 111/59 110/60  Pulse: 81     Resp: 18 20 14 18   Temp: 97.6 F (36.4 C)     SpO2: 97% 100% 100% 100%  Weight: 144 lb (65.3 kg)     Height: 5' (1.524 m)       Start Time: 0926 hrs. End Time: 0940 hrs.  Imaging Guidance (Spinal):          Type of Imaging Technique: Fluoroscopy Guidance (Spinal) Indication(s): Fluoroscopy guidance for needle placement to enhance accuracy in procedures requiring precise needle localization for targeted delivery of medication in or near specific anatomical locations not easily accessible without such real-time imaging assistance. Exposure Time: Please see nurses notes. Contrast: Before injecting any contrast, we confirmed that the patient did not have an allergy to iodine , shellfish, or radiological contrast. Once satisfactory needle placement was completed at the desired level, radiological contrast was injected. Contrast injected under live fluoroscopy. No contrast complications. See chart for type and volume of contrast used. Fluoroscopic Guidance: I was personally present during the use of fluoroscopy. Tunnel Vision Technique used to obtain the best possible view of the target area. Parallax error corrected before commencing the procedure. Direction-depth-direction technique used to introduce the needle under continuous pulsed fluoroscopy. Once target was reached, antero-posterior, oblique, and lateral fluoroscopic projection used confirm needle placement in all planes. Images permanently stored in EMR. Interpretation: I personally interpreted the imaging intraoperatively. Adequate needle placement confirmed in multiple planes. Appropriate spread of contrast into desired area was observed. No evidence of afferent or  efferent intravascular uptake. No intrathecal or subarachnoid spread observed. Permanent images saved into the patient's record.  Post-operative Assessment:  Post-procedure Vital Signs:  Pulse/HCG Rate: 8171 Temp: 97.6 F (36.4 C) Resp: 18 BP: 110/60 SpO2: 100 %  EBL: None  Complications: No immediate post-treatment complications observed by team, or reported by patient.  Note: The patient tolerated the entire procedure well. A repeat set of vitals were taken after the procedure and the patient was kept under observation following institutional policy, for this type of procedure. Post-procedural neurological assessment was performed, showing return to baseline, prior to discharge. The patient was provided with post-procedure discharge instructions, including a section on how to identify potential problems. Should any problems arise concerning this procedure, the patient was given instructions to immediately contact us , at any time, without hesitation. In any case, we plan to contact the patient by telephone for a follow-up status report regarding this interventional procedure.  Comments:  No additional relevant information.  Plan of Care (POC)  Orders:  Orders Placed This Encounter  Procedures   Lumbar Transforaminal Epidural    Scheduling Instructions:     Laterality: Right-sided     Level(s): L4 & L5     Sedation: With Sedation.     Date: 08/03/2024    Where will this procedure be performed?:   [facility]   [facility] C-ARM 1-60 MIN NO REPORT    Intraoperative interpretation by procedural physician at [facility].    Standing Status:   Standing    Number of Occurrences:   1    Reason for exam::   Assistance in needle guidance and placement for procedures requiring needle placement in or near specific anatomical locations not easily accessible without such assistance.   Informed Consent Details: Physician/Practitioner Attestation; Transcribe to consent form  and obtain patient signature    Provider Attestation: I, Jerrol Helmers A. Tanya, MD, (Pain Management Specialist), the physician/practitioner, attest that I have discussed with the patient the benefits, risks, side effects, alternatives, likelihood of achieving goals and potential problems during recovery for the procedure that I have provided informed consent.    Scheduling Instructions:     Note: Always confirm laterality of pain with Ms. Reimann, before procedure.     Transcribe to consent form and obtain patient signature.    Physician/Practitioner attestation of informed consent for procedure/surgical case:   I, the physician/practitioner, attest that I have discussed with the patient the benefits, risks, side effects, alternatives, likelihood of achieving goals and potential problems during recovery for the procedure that I have provided informed consent.    Procedure:   Diagnostic lumbar transforaminal epidural steroid injection under fluoroscopic guidance. (See notes for level and laterality.)    Physician/Practitioner performing the procedure:   Nykolas Bacallao A. Jedediah Noda, MD    Indication/Reason:   Lumbar radiculopathy/radiculitis associated with lumbar stenosis   Provide equipment / supplies at bedside    Procedure tray: Block Tray (Disposable  single use) Skin infiltration needle: Regular 1.5-in, 25-G, (x1) Block Needle type: Spinal Amount/quantity: 2 Size: Medium (5-inch) Gauge: 22G    Standing Status:   Standing    Number of Occurrences:   1    Specify:   Block Tray   Saline lock IV    Have LR [id number] mL available and administer at 125 mL/hr if patient becomes hypotensive.    Standing Status:   Standing    Number of Occurrences:   1     Opioid Analgesic: None MME/day: 0 mg/day    Medications ordered for procedure: Meds ordered this encounter  Medications   iohexol  (OMNIPAQUE ) 180 MG/ML injection 10 mL    Must be Myelogram-compatible. If not available, you may substitute with  a water -soluble, non-ionic, hypoallergenic, myelogram-compatible radiological contrast medium.   lidocaine  (XYLOCAINE ) 2 % (with pres) injection 400 mg  pentafluoroprop-tetrafluoroeth (GEBAUERS) aerosol   midazolam  (VERSED ) 5 MG/5ML injection 0.5-2 mg    Make sure Flumazenil is available in the pyxis when using this medication. If oversedation occurs, administer 0.2 mg IV over 15 sec. If after 45 sec no response, administer 0.2 mg again over 1 min; may repeat at 1 min intervals; not to exceed 4 doses (1 mg)   fentaNYL  (SUBLIMAZE ) injection 25-50 mcg    Make sure Narcan is available in the pyxis when using this medication. In the event of respiratory depression (RR< 8/min): Titrate NARCAN (naloxone) in increments of 0.1 to 0.2 mg IV at 2-3 minute intervals, until desired degree of reversal.   sodium chloride  flush (NS) 0.9 % injection 2 mL    This is for a two (2) level block. Use two (2) syringes and divide content in half.   ropivacaine  (PF) 2 mg/mL (0.2%) (NAROPIN ) injection 2 mL    This is for a two (2) level block. Use two (2) syringes and divide content in half.   dexamethasone  (DECADRON ) injection 20 mg    This is for a two (2) level block. Use two (2) syringes and divide content in half.   Medications administered: We administered iohexol , lidocaine , pentafluoroprop-tetrafluoroeth, midazolam , sodium chloride  flush, ropivacaine  (PF) 2 mg/mL (0.2%), and dexamethasone .  See the medical record for exact dosing, route, and time of administration.    Interventional Therapies  Risk Factors  Considerations  Medical Comorbidities:  Class 2 diastolic-CHF  HTN  GERD  OSA  IDDM  Depression     Planned  Pending:      Under consideration:   Therapeutic right L4-5 & L5-S1 TFESI #1  Therapeutic right L4-5 LESI #1  Diagnostic/therapeutic right IA hip joint inj. #1  Diagnostic/therapeutic right IA knee inj. #1  Diagnostic bilateral lumbar facet MBB #1  Diagnostic lower extremity  EMG/PNCV    Completed: (Analgesic benefit)1  None at this time   Therapeutic  Palliative (PRN) options:   None established   Completed by other providers:   Oral steroid taper done.  Some improvement, but no complete resolution of symptoms.  Referral to physical therapy done.   1(Analgesic benefit): Expressed in percentage (%). (Local anesthetic[LA] +/- sedation  L.A.Local Anesthetic  Steroid benefit  Ongoing benefit)      Follow-up plan:   Return in about 2 weeks (around 08/17/2024) for Eval-day (M,W), (Face2F), (PPE).     Recent Visits Date Type Provider Dept  07/12/24 Office Visit Tanya Glisson, MD [facility]  06/23/24 Office Visit Tanya Glisson, MD [facility]  Showing recent visits within past 90 days and meeting all other requirements Today's Visits Date Type Provider Dept  08/03/24 Procedure visit Tanya Glisson, MD [facility]  Showing today's visits and meeting all other requirements Future Appointments Date Type Provider Dept  08/18/24 Appointment Tanya Glisson, MD [facility]  Showing future appointments within next 90 days and meeting all other requirements   Disposition: Discharge home  Discharge (Date  Time): 08/03/2024; 0953 hrs.   Primary Care Physician: Autry Grayce LABOR, PA Location: [facility] Note by: Glisson LABOR Tanya, MD (TTS technology used. I apologize for any typographical errors that were not detected and corrected.) Date: 08/03/2024; Time: 10:13 AM  Disclaimer:  Medicine is not an Visual merchandiser. The only guarantee in medicine is that nothing is guaranteed. It is important to note that the decision to proceed with this intervention was based on the information collected from the  patient. The Data and conclusions were drawn from the patient's questionnaire, the interview, and the physical examination. Because the information was provided in large part by the  patient, it cannot be guaranteed that it has not been purposely or unconsciously manipulated. Every effort has been made to obtain as much relevant data as possible for this evaluation. It is important to note that the conclusions that lead to this procedure are derived in large part from the available data. Always take into account that the treatment will also be dependent on availability of resources and existing treatment guidelines, considered by other Pain Management Practitioners as being common knowledge and practice, at the time of the intervention. For Medico-Legal purposes, it is also important to point out that variation in procedural techniques and pharmacological choices are the acceptable norm. The indications, contraindications, technique, and results of the above procedure should only be interpreted and judged by a Board-Certified Interventional Pain Specialist with extensive familiarity and expertise in the same exact procedure and technique.

## 2024-08-03 NOTE — Patient Instructions (Signed)

## 2024-08-04 ENCOUNTER — Telehealth: Payer: Self-pay | Admitting: *Deleted

## 2024-08-04 ENCOUNTER — Ambulatory Visit

## 2024-08-04 DIAGNOSIS — R262 Difficulty in walking, not elsewhere classified: Secondary | ICD-10-CM

## 2024-08-04 DIAGNOSIS — M5459 Other low back pain: Secondary | ICD-10-CM

## 2024-08-04 DIAGNOSIS — M5416 Radiculopathy, lumbar region: Secondary | ICD-10-CM

## 2024-08-04 NOTE — Telephone Encounter (Signed)
 No problems post procedure.

## 2024-08-04 NOTE — Therapy (Signed)
 OUTPATIENT PHYSICAL THERAPY TREATMENT   Patient Name: Emma White MRN: [id number] DOB:[birth year], 68 y.o., female Today's Date: 08/04/2024  END OF SESSION:  PT End of Session - 08/04/24 1416     Visit Number 24    Number of Visits 25    Date for PT Re-Evaluation 08/13/24    PT Start Time 1416    PT Stop Time 1456    PT Time Calculation (min) 40 min    Activity Tolerance Patient tolerated treatment well    Behavior During Therapy [facility] for tasks assessed/performed                Past Medical History:  Diagnosis Date   Anxiety    Arthritis    hand   Complication of anesthesia 03/18/2022   Hypoxia in pacu. See pacu note 03/18/2022. The hospitalist was consulted for further work-up.   Depression    Diabetes mellitus without complication (HCC)    Dyspnea    DOE   Hyperlipemia    Hypertension    Neuropathy    Sleep apnea    Past Surgical History:  Procedure Laterality Date   ABDOMINAL HYSTERECTOMY  1995   APPENDECTOMY     BIOPSY  07/10/2023   Procedure: BIOPSY;  Surgeon: Onita Elspeth Sharper, DO;  Location: [facility] ENDOSCOPY;  Service: Gastroenterology;;   CATARACT EXTRACTION W/PHACO Left 09/25/2017   Procedure: CATARACT EXTRACTION PHACO AND INTRAOCULAR LENS PLACEMENT (IOC);  Surgeon: Myrna Adine Anes, MD;  Location: [facility] ORS;  Service: Ophthalmology;  Laterality: Left;   flui pack lot # [id number]  US  00:32.7 AP%   12.17 CDE   3.95   CATARACT EXTRACTION W/PHACO Right 10/16/2017   Procedure: CATARACT EXTRACTION PHACO AND INTRAOCULAR LENS PLACEMENT (IOC);  Surgeon: Myrna Adine Anes, MD;  Location: [facility] ORS;  Service: Ophthalmology;  Laterality: Right;  US  00:32.8 AP% 9.0 CDE 2.96 FLUID PACK LOT # [id number]   COLONOSCOPY WITH PROPOFOL  N/A 05/27/2016   Procedure: COLONOSCOPY WITH PROPOFOL ;  Surgeon: Lamar ONEIDA Holmes, MD;  Location: [facility] ENDOSCOPY;  Service: Endoscopy;  Laterality: N/A;   COLONOSCOPY WITH PROPOFOL  N/A 07/10/2021   Procedure: COLONOSCOPY WITH  PROPOFOL ;  Surgeon: Maryruth Ole ONEIDA, MD;  Location: [facility] ENDOSCOPY;  Service: Endoscopy;  Laterality: N/A;   COLONOSCOPY WITH PROPOFOL  N/A 10/30/2021   Procedure: COLONOSCOPY WITH PROPOFOL ;  Surgeon: Maryruth Ole ONEIDA, MD;  Location: [facility] ENDOSCOPY;  Service: Endoscopy;  Laterality: N/A;   COLONOSCOPY WITH PROPOFOL  N/A 02/05/2022   Procedure: COLONOSCOPY WITH PROPOFOL ;  Surgeon: Maryruth Ole ONEIDA, MD;  Location: [facility] ENDOSCOPY;  Service: Endoscopy;  Laterality: N/A;   COLONOSCOPY WITH PROPOFOL  N/A 07/10/2023   Procedure: COLONOSCOPY WITH PROPOFOL ;  Surgeon: Onita Elspeth Sharper, DO;  Location: [facility] ENDOSCOPY;  Service: Gastroenterology;  Laterality: N/A;   ESOPHAGOGASTRODUODENOSCOPY (EGD) WITH PROPOFOL  N/A 07/10/2021   Procedure: ESOPHAGOGASTRODUODENOSCOPY (EGD) WITH PROPOFOL ;  Surgeon: Maryruth Ole ONEIDA, MD;  Location: [facility] ENDOSCOPY;  Service: Endoscopy;  Laterality: N/A;  IDDM   ESOPHAGOGASTRODUODENOSCOPY (EGD) WITH PROPOFOL  N/A 10/30/2021   Procedure: ESOPHAGOGASTRODUODENOSCOPY (EGD) WITH PROPOFOL ;  Surgeon: Maryruth Ole ONEIDA, MD;  Location: [facility] ENDOSCOPY;  Service: Endoscopy;  Laterality: N/A;  IDDM   EYE SURGERY Left 09/25/2017   cataract extraction   HYSTERECTOMY SUPRACERVICAL ABDOMINAL W/REMOVAL TUBES &/OR OVARIES  1995   KENALOG  INJECTION Right 10/16/2017   Procedure: KENALOG  INJECTION;  Surgeon: Myrna Adine Anes, MD;  Location: [facility] ORS;  Service: Ophthalmology;  Laterality: Right;   KNEE ARTHROCENTESIS Left 1981   KNEE ARTHROCENTESIS Bilateral 1992  right one in 1992   KNEE ARTHROCENTESIS Bilateral    POLYPECTOMY  07/10/2023   Procedure: POLYPECTOMY;  Surgeon: Onita Elspeth Sharper, DO;  Location: [facility] ENDOSCOPY;  Service: Gastroenterology;;   XI ROBOTIC LAPAROSCOPIC ASSISTED APPENDECTOMY N/A 03/18/2022   Procedure: XI ROBOTIC LAPAROSCOPIC ASSISTED APPENDECTOMY;  Surgeon: Rodolph Romano, MD;  Location: [facility] ORS;  Service: General;  Laterality: N/A;    Patient Active Problem List   Diagnosis Date Noted   Lumbar facet hypertrophy (Bilateral: L4-5, L5-S1) 07/12/2024   Lumbar facet joint syndrome 07/12/2024   Tricompartment osteoarthritis of knees (Bilateral) 07/12/2024   Chondrocalcinosis of knees (Bilateral) 07/12/2024   Chronic knee pain (Bilateral) (R>L) 06/23/2024   Chronic hip pain (Right) 06/23/2024   Impaired range of motion of hip (Right) 06/23/2024   Chronic lower extremity pain (Right) 06/23/2024   Impaired range of motion of knee (Right) 06/23/2024   Abnormal MRI, lumbar spine [facility]) (03/05/2024) 06/23/2024   Lumbosacral radiculopathy at L5 (Right) 06/23/2024   Chronic radicular pain of lower extremity (Right) 06/23/2024   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, L4-5) 06/23/2024   Foraminal stenosis of lumbosacral region (L5-S1) (SEVERE  on Right) 06/23/2024   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) 06/23/2024   Lumbar facet arthropathy (Bilateral: L2-3, L3-4, L4-5, L5-S1) 06/23/2024   Lumbar facet joint pain (Bilateral) 06/23/2024   Low back pain of over 3 months duration 06/23/2024   Low back pain potentially associated with radiculopathy 06/23/2024   Low back pain radiating to leg (Right) 06/23/2024   Multifactorial low back pain 06/23/2024   Dextroscoliosis of lumbar spine (L3-4) 06/23/2024   DDD of lumbosacral region w/ LBP & LEP 06/23/2024   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) 06/23/2024   Osteoarthritis of lumbar spine 06/23/2024   Pharmacologic therapy 06/22/2024   Disorder of skeletal system 06/22/2024   Problems influencing health status 06/22/2024   Functional heart murmur 05/24/2024   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) 03/07/2024   Hypoxemic respiratory failure, chronic (HCC) 09/10/2022   Obstructive sleep apnea 05/29/2022   Chronic diastolic CHF (congestive heart failure), NYHA class 2 (HCC) 04/18/2022   Anemia    History of laparoscopic  appendectomy    Screening for osteoporosis 03/12/2022   Polyp of colon 12/03/2021   Abnormal mammogram 09/21/2021   Encounter for screening for nutritional disorder 04/07/2020   Hearing loss 04/07/2020   Mixed hyperlipidemia 04/07/2020   Shoulder pain (Right) 04/07/2020   Elevated LFTs 09/28/2019   Insomnia due to medical condition 08/24/2019   Hypoxia 08/20/2019   Essential hypertension 08/20/2019   Depression 08/20/2019   Disease due to severe acute respiratory syndrome coronavirus 2 (SARS-CoV-2) 08/20/2019   Hyperlipidemia due to type 2 diabetes mellitus (HCC) 08/20/2019   Status post total hysterectomy 08/20/2019   Type 2 diabetes mellitus with diabetic polyneuropathy, with long-term current use of insulin  (HCC) 08/20/2019   Cough, unspecified 11/27/2018   Disorder associated with type 2 diabetes mellitus (HCC) 11/27/2018   Type 2 diabetes mellitus without complications (HCC) 01/06/2017   Major depressive disorder, recurrent episode, mild degree (HCC) 01/06/2017   Chronic pain syndrome 04/08/2016   Chronic low back pain (Bilateral) (R>L) w/ sciatica (Right) 04/08/2016   Peripheral neuropathy 02/15/2015   Arthritis 08/10/2012   Esophageal reflux 08/10/2012    PCP: Autry Grayce LABOR, PA   REFERRING PROVIDER: Rocco Organ Dipakkumar, MD  REFERRING DIAG: M51.369 (ICD-10-CM) - Other intervertebral disc degeneration, lumbar region without mention of lumbar back pain or  lower extremity pain  Rationale for Evaluation and Treatment: Rehabilitation  THERAPY DIAG:  Other low back pain  Radiculopathy, lumbar region  Difficulty in walking, not elsewhere classified  ONSET DATE: December 2024  SUBJECTIVE:                                                                                                                                                                                           SUBJECTIVE STATEMENT:  Feels a lot better. Feels fine, don't hurt no more. The previous  session helped. Had a steriod shot for her R low back yesterday.     PERTINENT HISTORY:  Low back pain. Currently wearing a back brace which is not helping. Pt states having a bulging disc. Pain started December 2024, sudden onset, unknown mechanism of injury. Pt states having R Sciatic nerve in R posterior hip pain initially. Had x-ryas done, then and MRI which revealed bulging disc. Pain radiates along the R L5 dermatome as well as sciatic nerve (to superficial peroneal nerve distribution) to dorsal foot. R anterior ankle hurts. Pain has worsened since January 2025. Surgeon wants pt to do PT.      No latex allergies   Had L eye surgery 03/17/2024, pt states she is clear to participate in PT. 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  Blood pressure is controlled per pt.  DM is controlled per pt.  Has a CPAP machine.     PAIN:  Are you having pain? Yes: NPRS scale: 4/10 Pain location: Low back and R L5 dermatome and R sciatic nerve distribution to R dorsal foot.  Pain description: Low back: pressure; R LE: ache, tight Aggravating factors: Sleeping on her R side, standing and walking Relieving factors: leaning to the L, sleeping on her L side, leaning forward, wearing her back brace  PRECAUTIONS: Cannot see with L eye.     RED FLAGS: Bowel or bladder incontinence: No and Cauda equina syndrome: No   WEIGHT BEARING RESTRICTIONS: No  FALLS:  Has patient fallen in last 6 months? No  LIVING ENVIRONMENT: Lives with: lives with their spouse Lives in: House/apartment Stairs: Yes: Internal: 2 steps; none and External: 0 steps; none Has following equipment at home: Single point cane  OCCUPATION: Housewife  PLOF: Independent  PATIENT GOALS: decrease pain  NEXT MD VISIT: none at the moment  OBJECTIVE:  Note: Objective measures were completed at Evaluation unless otherwise noted.  DIAGNOSTIC FINDINGS:    PATIENT SURVEYS:  Modified Oswestry Low Back Pain Disability Questionnaire 62%  (04/22/2024)  COGNITION: Overall cognitive status: Within functional limits for tasks assessed     SENSATION:  MUSCLE LENGTH:   POSTURE: forward neck, B protracted shoulders, L trunk side bend, movement preference around L4/L5 area, L weight shifting.   PALPATION: TTP R low back, increased B lumbar paraspinal muscle tension R > L  TTP R L5 > L4 > L3 TTP R low back > L side     LUMBAR ROM:   AROM eval  Flexion WFL, R posterior hip pain when returning to the upright position  Extension Very limited with R posterior hip pain  Right lateral flexion Limited with R lateral hip joint pain (worse than L lateral flexion)  Left lateral flexion Limited with R lateral hip pain  Right rotation [facility] with R posterior lateral hip pain worse than L rotation  Left rotation WFL with R posterior lateral hip pain.    (Blank rows = not tested)  LOWER EXTREMITY ROM:     Passive  Right 05/31/2024 Left 05/31/2024  Hip flexion    Hip extension    Hip abduction    Hip adduction    Hip internal rotation 37 18  Hip external rotation 37 with R low back pain (different pain) 34  Knee flexion    Knee extension    Ankle dorsiflexion    Ankle plantarflexion    Ankle inversion    Ankle eversion     (Blank rows = not tested)  LOWER EXTREMITY MMT:    MMT Right eval Left eval  Hip flexion    Hip extension (seated manually resisted) 3+ 3+  Hip abduction (seated manually resisted) 4- 4-  Hip adduction    Hip internal rotation    Hip external rotation    Knee flexion 4 4  Knee extension 4+ 4+  Ankle dorsiflexion    Ankle plantarflexion    Ankle inversion    Ankle eversion     (Blank rows = not tested)  LUMBAR SPECIAL TESTS:    FUNCTIONAL TESTS:    GAIT: Distance walked: 30 ft Assistive device utilized: None Level of assistance: SBA Comments: Decreased stance R LE, antalgic with R trunk side bend during R LE stance phase, decreased B knee extension during stance phase, decreased  foot clearance and gait velocity.   TREATMENT DATE: 08/04/2024     There.Act:  Performed with the intent of improving ability to perform chores at home such as sweeping, mopping, and vacuuming with less back pain.   Standing B shoulder adduction at squat rack (for band attachment)  Red band 10x3 with 10 second holds   Standing with transversus abdominis contraction   With 25 lb squat bar with landmine tip on the bottom   R and L lateral holds 10x5 seconds for 2 sets each side    Emphasis on trunk and pelvic stability.     Performted to promote ability to perform standing chores such as sweeping and mopping.     Forward step up onto first regular step with B UE assist   L 10x3  R 10x3  To promote glute max strength  Standing B shoulder extension GTB 3x10. VC's for form/technique.   Standing scap retraction with GTB: 2x10 with 5 second holds. VC's for slowed completion of reps.          Improved exercise technique, movement at target joints, use of target muscles after mod verbal, visual, tactile cues.     PATIENT EDUCATION:  Education details: there-ex, HEP, POC Person educated: Patient Education method: Explanation, Demonstration, Tactile cues, Verbal cues, and Handouts Education comprehension: verbalized understanding and  returned demonstration  HOME EXERCISE PROGRAM: Access Code: [email or web address] URL: [email or web address] Date: 04/22/2024 Prepared by: Emil Glassman  Exercises - Seated Transversus Abdominis Bracing  - 3 x daily - 7 x weekly - 3 sets - 10 reps - 5 seconds hold  Upgraded to standing on (06/15/2024) - Seated Gluteal Sets  - 3 x daily - 7 x weekly - 3 sets - 10 reps - 5 seconds hold  Upgraded to standing on (06/15/2024)  Reclined, hooklying hip extension isometrics with leg straight  R 10x5 seconds for 3 sets daily.  Drawing provided.      - Supine Transversus Abdominis Bracing - Hands on Stomach  - 5 x daily - 7 x weekly - 3 sets - 10 reps  - 5 seconds hold - Supine Posterior Pelvic Tilt  - 2 x daily - 7 x weekly - 3 sets - 5 reps - 15 seconds hold  - Bent Knee Fallouts with Alternating Legs  - 1 x daily - 7 x weekly - 3 sets - 5 reps  - Supine Piriformis Stretch with Towel  - 3 x daily - 7 x weekly - 1 sets - 5 reps - 30 seconds hold   Sitting with lumbar towel roll 2 min  - Seated Flexion Stretch  - 3 x daily - 7 x weekly - 1 sets - 5 reps - 30 seconds hold  - Standing Gluteal Sets  - 1 x daily - 7 x weekly - 3 sets - 10 reps - 10 seconds hold - Standing Transverse Abdominis Contraction  - 1 x daily - 7 x weekly - 3 sets - 10 reps - 10 seconds hold  - Forward Step Up with Counter Support  - 1 x daily - 7 x weekly - 3 sets - 10 reps   Seated B shoulder extension isometrics with hands on knees 5x5 seconds  - Supine Bridge  - 1 x daily - 7 x weekly - 2-3 sets - 5 reps  - Hooklying Clamshell with Resistance  - 1 x daily - 7 x weekly - 3 sets - 10 reps  Green band  - Shoulder Extension with Resistance - Palms Forward  - 1 x daily - 7 x weekly - 3 sets - 10 reps - 5 seconds hold  Red band  - Shoulder Adduction with Anchored Resistance  - 1 x daily - 7 x weekly - 3 sets - 10 reps  ASSESSMENT:  CLINICAL IMPRESSION: Pt making very good progress with decreased low back pain with trunk strengthening and recent lumbar injection based on subjective reports. Continued working on lateral trunk and B glute strength to help promote trunk stability in the coronal plane. No pain reported throughout session. Good muscle use reported with exercises. Pt tolerated session well without aggravation of symptoms. Pt will benefit from continued skilled physical therapy services to decrease pain, improve strength and function.       OBJECTIVE IMPAIRMENTS: Abnormal gait, decreased balance, difficulty walking, decreased strength, improper body mechanics, postural dysfunction, and pain.   ACTIVITY LIMITATIONS: carrying, lifting, bending,  sitting, standing, squatting, sleeping, stairs, transfers, bed mobility, dressing, hygiene/grooming, and locomotion level  PARTICIPATION LIMITATIONS:   PERSONAL FACTORS: Age, Fitness, Time since onset of injury/illness/exacerbation, and 3+ comorbidities: anxiety, arthritis, depression, DM, HTN, sleep apnea are also affecting patient's functional outcome.   REHAB POTENTIAL: Fair    CLINICAL DECISION MAKING: Stable/uncomplicated Pain has worsened since onset per pt.   EVALUATION COMPLEXITY: Low   GOALS: Goals reviewed  with patient? Yes  SHORT TERM GOALS: Target date: 05/07/2024  Pt will be independent with her initial HEP to decrease pain, improve strength, function, and ability to perform standing tasks more comfortably.  Baseline: Pt has started her initial HEP (04/22/2024); doing her HEP, no questions (06/02/2024) Goal status: MET   LONG TERM GOALS: Target date: 07/16/2024  Pt will have a decrease in low back pain to 4/10 or less at worst to promote ability to perform standing tasks more comfortably.  Baseline:8/10 low back pain at worst for the past 3 months (04/22/2024); 5/10 back pain at most for the past 7 days, also not currently wearing her back brace (06/02/2024), 5/10 at worst but when she vacuums, back pain goes up to a 6/10. Currently no longer using her back brace (06/07/2024); 6/10 at worst for the past 7 days (06/15/2024); 5/10 at most for the past 7 days (07/05/2024); 07/12/24: 5/10 NPS consistently Goal status: PROGRESSING  2.  Pt will have a decrease in R LE pain to 4/10 or less at worst to promote ability to ambulate and perform standing tasks more comfortably.  Baseline: 9/10 R LE pain at worst for the past 3 months. (04/22/2024); 5/10 R LE pain at most for the past 7 days, does not hurt often like it used to (06/02/2024), 5/10 at most but when she vacuums, R LE pain goes up to a 6/10. Currently no longer using her back brace (06/07/2024); 6/10 at most for the past 7 days. (07/05/2024);   Goal status: PROGRESSING  3.  Pt will improve her Modified Oswestry Low Back Pain Disability Questionnaire by at least 20% as a demonstration of improved function.  Baseline: Modified Oswestry Low Back Pain Disability Questionnaire 62% (04/22/2024); 46 % (06/07/2024); 07/12/24: 36% Goal status: PROGRESSING  4.  Pt will improve her B hip extension and abduction strength by at least 1/2 MMT grade to promote ability to ambulate and perform standing tasks more comfortably.  Baseline:  MMT Right eval Left eval R (06/07/2024) L (06/07/2024)  Hip extension (seated manually resisted) 3+ 3+ 4+ 4  Hip abduction (seated manually resisted) 4- 4- 4+ 4+   (04/22/2024);  Goal status: MET   PLAN:  PT FREQUENCY: 1-2x/week  PT DURATION: 4 weeks  PLANNED INTERVENTIONS: 97110-Therapeutic exercises, 97530- Therapeutic activity, 97112- Neuromuscular re-education, [phone]- Self Care, [id number]- Manual therapy, [phone]- Gait training, [phone]- Aquatic Therapy, [phone]- Electrical stimulation (unattended), [phone]- Traction (mechanical), [id number]- Ionotophoresis 4mg /ml Dexamethasone , Patient/Family education, Balance training, Stair training, Dry Needling, Joint mobilization, and Spinal mobilization.  PLAN FOR NEXT SESSION: Posture, trunk and glute strengthening, manual techniques, modalities PRN    Emil Glassman PT, DPT Physical Therapist- [facility]  [facility] 08/04/2024, 2:58 PM

## 2024-08-06 ENCOUNTER — Ambulatory Visit

## 2024-08-06 DIAGNOSIS — M5459 Other low back pain: Secondary | ICD-10-CM

## 2024-08-06 DIAGNOSIS — M5416 Radiculopathy, lumbar region: Secondary | ICD-10-CM

## 2024-08-06 DIAGNOSIS — R262 Difficulty in walking, not elsewhere classified: Secondary | ICD-10-CM

## 2024-08-06 NOTE — Therapy (Addendum)
 OUTPATIENT PHYSICAL THERAPY TREATMENT And Discharge Summary   Patient Name: Emma White MRN: [id number] DOB:1956-11-09, 68 y.o., female Today's Date: 08/06/2024  END OF SESSION:  PT End of Session - 08/06/24 0731     Visit Number 25    Number of Visits 25    Date for PT Re-Evaluation 08/13/24    PT Start Time 0731    PT Stop Time 0813    PT Time Calculation (min) 42 min    Activity Tolerance Patient tolerated treatment well    Behavior During Therapy [facility] for tasks assessed/performed                 Past Medical History:  Diagnosis Date   Anxiety    Arthritis    hand   Complication of anesthesia 03/18/2022   Hypoxia in pacu. See pacu note 03/18/2022. The hospitalist was consulted for further work-up.   Depression    Diabetes mellitus without complication (HCC)    Dyspnea    DOE   Hyperlipemia    Hypertension    Neuropathy    Sleep apnea    Past Surgical History:  Procedure Laterality Date   ABDOMINAL HYSTERECTOMY  1995   APPENDECTOMY     BIOPSY  07/10/2023   Procedure: BIOPSY;  Surgeon: Onita Elspeth Sharper, DO;  Location: [facility] ENDOSCOPY;  Service: Gastroenterology;;   CATARACT EXTRACTION W/PHACO Left 09/25/2017   Procedure: CATARACT EXTRACTION PHACO AND INTRAOCULAR LENS PLACEMENT (IOC);  Surgeon: Myrna Adine Anes, MD;  Location: [facility] ORS;  Service: Ophthalmology;  Laterality: Left;   flui pack lot # [id number]  US  00:32.7 AP%   12.17 CDE   3.95   CATARACT EXTRACTION W/PHACO Right 10/16/2017   Procedure: CATARACT EXTRACTION PHACO AND INTRAOCULAR LENS PLACEMENT (IOC);  Surgeon: Myrna Adine Anes, MD;  Location: [facility] ORS;  Service: Ophthalmology;  Laterality: Right;  US  00:32.8 AP% 9.0 CDE 2.96 FLUID PACK LOT # [id number]   COLONOSCOPY WITH PROPOFOL  N/A 05/27/2016   Procedure: COLONOSCOPY WITH PROPOFOL ;  Surgeon: Lamar ONEIDA Holmes, MD;  Location: [facility] ENDOSCOPY;  Service: Endoscopy;  Laterality: N/A;   COLONOSCOPY WITH PROPOFOL  N/A 07/10/2021    Procedure: COLONOSCOPY WITH PROPOFOL ;  Surgeon: Maryruth Ole ONEIDA, MD;  Location: [facility] ENDOSCOPY;  Service: Endoscopy;  Laterality: N/A;   COLONOSCOPY WITH PROPOFOL  N/A 10/30/2021   Procedure: COLONOSCOPY WITH PROPOFOL ;  Surgeon: Maryruth Ole ONEIDA, MD;  Location: [facility] ENDOSCOPY;  Service: Endoscopy;  Laterality: N/A;   COLONOSCOPY WITH PROPOFOL  N/A 02/05/2022   Procedure: COLONOSCOPY WITH PROPOFOL ;  Surgeon: Maryruth Ole ONEIDA, MD;  Location: [facility] ENDOSCOPY;  Service: Endoscopy;  Laterality: N/A;   COLONOSCOPY WITH PROPOFOL  N/A 07/10/2023   Procedure: COLONOSCOPY WITH PROPOFOL ;  Surgeon: Onita Elspeth Sharper, DO;  Location: [facility] ENDOSCOPY;  Service: Gastroenterology;  Laterality: N/A;   ESOPHAGOGASTRODUODENOSCOPY (EGD) WITH PROPOFOL  N/A 07/10/2021   Procedure: ESOPHAGOGASTRODUODENOSCOPY (EGD) WITH PROPOFOL ;  Surgeon: Maryruth Ole ONEIDA, MD;  Location: [facility] ENDOSCOPY;  Service: Endoscopy;  Laterality: N/A;  IDDM   ESOPHAGOGASTRODUODENOSCOPY (EGD) WITH PROPOFOL  N/A 10/30/2021   Procedure: ESOPHAGOGASTRODUODENOSCOPY (EGD) WITH PROPOFOL ;  Surgeon: Maryruth Ole ONEIDA, MD;  Location: [facility] ENDOSCOPY;  Service: Endoscopy;  Laterality: N/A;  IDDM   EYE SURGERY Left 09/25/2017   cataract extraction   HYSTERECTOMY SUPRACERVICAL ABDOMINAL W/REMOVAL TUBES &/OR OVARIES  1995   KENALOG  INJECTION Right 10/16/2017   Procedure: KENALOG  INJECTION;  Surgeon: Myrna Adine Anes, MD;  Location: [facility] ORS;  Service: Ophthalmology;  Laterality: Right;   KNEE ARTHROCENTESIS Left 1981   KNEE ARTHROCENTESIS  Bilateral 1992   right one in 1992   KNEE ARTHROCENTESIS Bilateral    POLYPECTOMY  07/10/2023   Procedure: POLYPECTOMY;  Surgeon: Onita Elspeth Sharper, DO;  Location: [facility] ENDOSCOPY;  Service: Gastroenterology;;   XI ROBOTIC LAPAROSCOPIC ASSISTED APPENDECTOMY N/A 03/18/2022   Procedure: XI ROBOTIC LAPAROSCOPIC ASSISTED APPENDECTOMY;  Surgeon: Rodolph Romano, MD;  Location: [facility] ORS;  Service: General;   Laterality: N/A;   Patient Active Problem List   Diagnosis Date Noted   Lumbar facet hypertrophy (Bilateral: L4-5, L5-S1) 07/12/2024   Lumbar facet joint syndrome 07/12/2024   Tricompartment osteoarthritis of knees (Bilateral) 07/12/2024   Chondrocalcinosis of knees (Bilateral) 07/12/2024   Chronic knee pain (Bilateral) (R>L) 06/23/2024   Chronic hip pain (Right) 06/23/2024   Impaired range of motion of hip (Right) 06/23/2024   Chronic lower extremity pain (Right) 06/23/2024   Impaired range of motion of knee (Right) 06/23/2024   Abnormal MRI, lumbar spine [facility]) (03/05/2024) 06/23/2024   Lumbosacral radiculopathy at L5 (Right) 06/23/2024   Chronic radicular pain of lower extremity (Right) 06/23/2024   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, L4-5) 06/23/2024   Foraminal stenosis of lumbosacral region (L5-S1) (SEVERE  on Right) 06/23/2024   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) 06/23/2024   Lumbar facet arthropathy (Bilateral: L2-3, L3-4, L4-5, L5-S1) 06/23/2024   Lumbar facet joint pain (Bilateral) 06/23/2024   Low back pain of over 3 months duration 06/23/2024   Low back pain potentially associated with radiculopathy 06/23/2024   Low back pain radiating to leg (Right) 06/23/2024   Multifactorial low back pain 06/23/2024   Dextroscoliosis of lumbar spine (L3-4) 06/23/2024   DDD of lumbosacral region w/ LBP & LEP 06/23/2024   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) 06/23/2024   Osteoarthritis of lumbar spine 06/23/2024   Pharmacologic therapy 06/22/2024   Disorder of skeletal system 06/22/2024   Problems influencing health status 06/22/2024   Functional heart murmur 05/24/2024   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) 03/07/2024   Hypoxemic respiratory failure, chronic (HCC) 09/10/2022   Obstructive sleep apnea 05/29/2022   Chronic diastolic CHF (congestive heart failure), NYHA class 2 (HCC) 04/18/2022   Anemia    History of  laparoscopic appendectomy    Screening for osteoporosis 03/12/2022   Polyp of colon 12/03/2021   Abnormal mammogram 09/21/2021   Encounter for screening for nutritional disorder 04/07/2020   Hearing loss 04/07/2020   Mixed hyperlipidemia 04/07/2020   Shoulder pain (Right) 04/07/2020   Elevated LFTs 09/28/2019   Insomnia due to medical condition 08/24/2019   Hypoxia 08/20/2019   Essential hypertension 08/20/2019   Depression 08/20/2019   Disease due to severe acute respiratory syndrome coronavirus 2 (SARS-CoV-2) 08/20/2019   Hyperlipidemia due to type 2 diabetes mellitus (HCC) 08/20/2019   Status post total hysterectomy 08/20/2019   Type 2 diabetes mellitus with diabetic polyneuropathy, with long-term current use of insulin  (HCC) 08/20/2019   Cough, unspecified 11/27/2018   Disorder associated with type 2 diabetes mellitus (HCC) 11/27/2018   Type 2 diabetes mellitus without complications (HCC) 01/06/2017   Major depressive disorder, recurrent episode, mild degree (HCC) 01/06/2017   Chronic pain syndrome 04/08/2016   Chronic low back pain (Bilateral) (R>L) w/ sciatica (Right) 04/08/2016   Peripheral neuropathy 02/15/2015   Arthritis 08/10/2012   Esophageal reflux 08/10/2012    PCP: Autry Grayce LABOR, PA   REFERRING PROVIDER: Rocco Organ Dipakkumar, MD  REFERRING DIAG: M51.369 (ICD-10-CM) - Other intervertebral disc degeneration, lumbar region without mention of  lumbar back pain or lower extremity pain  Rationale for Evaluation and Treatment: Rehabilitation  THERAPY DIAG:  Other low back pain  Radiculopathy, lumbar region  Difficulty in walking, not elsewhere classified  ONSET DATE: December 2024  SUBJECTIVE:                                                                                                                                                                                           SUBJECTIVE STATEMENT:  Back feels good, no pain currently. Was good after  last session. Feels like she can graduate PT today and continue her progress with her HEP.     PERTINENT HISTORY:  Low back pain. Currently wearing a back brace which is not helping. Pt states having a bulging disc. Pain started December 2024, sudden onset, unknown mechanism of injury. Pt states having R Sciatic nerve in R posterior hip pain initially. Had x-ryas done, then and MRI which revealed bulging disc. Pain radiates along the R L5 dermatome as well as sciatic nerve (to superficial peroneal nerve distribution) to dorsal foot. R anterior ankle hurts. Pain has worsened since January 2025. Surgeon wants pt to do PT.      No latex allergies   Had L eye surgery 03/17/2024, pt states she is clear to participate in PT. 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  Blood pressure is controlled per pt.  DM is controlled per pt.  Has a CPAP machine.     PAIN:  Are you having pain? Yes: NPRS scale: 4/10 Pain location: Low back and R L5 dermatome and R sciatic nerve distribution to R dorsal foot.  Pain description: Low back: pressure; R LE: ache, tight Aggravating factors: Sleeping on her R side, standing and walking Relieving factors: leaning to the L, sleeping on her L side, leaning forward, wearing her back brace  PRECAUTIONS: Cannot see with L eye.     RED FLAGS: Bowel or bladder incontinence: No and Cauda equina syndrome: No   WEIGHT BEARING RESTRICTIONS: No  FALLS:  Has patient fallen in last 6 months? No  LIVING ENVIRONMENT: Lives with: lives with their spouse Lives in: House/apartment Stairs: Yes: Internal: 2 steps; none and External: 0 steps; none Has following equipment at home: Single point cane  OCCUPATION: Housewife  PLOF: Independent  PATIENT GOALS: decrease pain  NEXT MD VISIT: none at the moment  OBJECTIVE:  Note: Objective measures were completed at Evaluation unless otherwise noted.  DIAGNOSTIC FINDINGS:    PATIENT SURVEYS:  Modified Oswestry Low Back Pain  Disability Questionnaire 62% (04/22/2024)  COGNITION: Overall cognitive status: Within functional limits for tasks assessed  SENSATION:   MUSCLE LENGTH:   POSTURE: forward neck, B protracted shoulders, L trunk side bend, movement preference around L4/L5 area, L weight shifting.   PALPATION: TTP R low back, increased B lumbar paraspinal muscle tension R > L  TTP R L5 > L4 > L3 TTP R low back > L side     LUMBAR ROM:   AROM eval  Flexion WFL, R posterior hip pain when returning to the upright position  Extension Very limited with R posterior hip pain  Right lateral flexion Limited with R lateral hip joint pain (worse than L lateral flexion)  Left lateral flexion Limited with R lateral hip pain  Right rotation [facility] with R posterior lateral hip pain worse than L rotation  Left rotation WFL with R posterior lateral hip pain.    (Blank rows = not tested)  LOWER EXTREMITY ROM:     Passive  Right 05/31/2024 Left 05/31/2024  Hip flexion    Hip extension    Hip abduction    Hip adduction    Hip internal rotation 37 18  Hip external rotation 37 with R low back pain (different pain) 34  Knee flexion    Knee extension    Ankle dorsiflexion    Ankle plantarflexion    Ankle inversion    Ankle eversion     (Blank rows = not tested)  LOWER EXTREMITY MMT:    MMT Right eval Left eval  Hip flexion    Hip extension (seated manually resisted) 3+ 3+  Hip abduction (seated manually resisted) 4- 4-  Hip adduction    Hip internal rotation    Hip external rotation    Knee flexion 4 4  Knee extension 4+ 4+  Ankle dorsiflexion    Ankle plantarflexion    Ankle inversion    Ankle eversion     (Blank rows = not tested)  LUMBAR SPECIAL TESTS:    FUNCTIONAL TESTS:    GAIT: Distance walked: 30 ft Assistive device utilized: None Level of assistance: SBA Comments: Decreased stance R LE, antalgic with R trunk side bend during R LE stance phase, decreased B knee extension  during stance phase, decreased foot clearance and gait velocity.   TREATMENT DATE: 08/06/2024     There.Act:  Performed with the intent of improving ability to perform chores at home such as sweeping, mopping, and vacuuming with less back pain.   Standing B shoulder adduction at squat rack (for band attachment)  Red band 10x3 with 10 second holds   Standing with transversus abdominis contraction   With 25 lb squat bar with landmine tip on the bottom   R and L lateral holds 10x5 seconds for 2 sets each side    Emphasis on trunk and pelvic stability.     Performted to promote ability to perform standing chores such as sweeping and mopping.    Standign hip abduction with B UE assist   L 10x5 seconds for 3 sets  R 10x5 seconds for 3 sets  Standing static mini lunge 10x3 each LE    Improved exercise technique, movement at target joints, use of target muscles after mod verbal, visual, tactile cues.     PATIENT EDUCATION:  Education details: there-ex, HEP, POC Person educated: Patient Education method: Explanation, Demonstration, Tactile cues, Verbal cues, and Handouts Education comprehension: verbalized understanding and returned demonstration  HOME EXERCISE PROGRAM: Access Code: [email or web address] URL: [email or web address] Date: 04/22/2024 Prepared by: Emil Glassman  Exercises - Seated Transversus Abdominis Bracing  -  3 x daily - 7 x weekly - 3 sets - 10 reps - 5 seconds hold  Upgraded to standing on (06/15/2024) - Seated Gluteal Sets  - 3 x daily - 7 x weekly - 3 sets - 10 reps - 5 seconds hold  Upgraded to standing on (06/15/2024)  Reclined, hooklying hip extension isometrics with leg straight  R 10x5 seconds for 3 sets daily.  Drawing provided.      - Supine Transversus Abdominis Bracing - Hands on Stomach  - 5 x daily - 7 x weekly - 3 sets - 10 reps - 5 seconds hold - Supine Posterior Pelvic Tilt  - 2 x daily - 7 x weekly - 3 sets - 5 reps - 15 seconds hold  -  Bent Knee Fallouts with Alternating Legs  - 1 x daily - 7 x weekly - 3 sets - 5 reps  - Supine Piriformis Stretch with Towel  - 3 x daily - 7 x weekly - 1 sets - 5 reps - 30 seconds hold   Sitting with lumbar towel roll 2 min  - Seated Flexion Stretch  - 3 x daily - 7 x weekly - 1 sets - 5 reps - 30 seconds hold  - Standing Gluteal Sets  - 1 x daily - 7 x weekly - 3 sets - 10 reps - 10 seconds hold - Standing Transverse Abdominis Contraction  - 1 x daily - 7 x weekly - 3 sets - 10 reps - 10 seconds hold  - Forward Step Up with Counter Support  - 1 x daily - 7 x weekly - 3 sets - 10 reps   Seated B shoulder extension isometrics with hands on knees 5x5 seconds  - Supine Bridge  - 1 x daily - 7 x weekly - 2-3 sets - 5 reps  - Hooklying Clamshell with Resistance  - 1 x daily - 7 x weekly - 3 sets - 10 reps  Green band  - Shoulder Extension with Resistance - Palms Forward  - 1 x daily - 7 x weekly - 3 sets - 10 reps - 5 seconds hold  Red band  - Shoulder Adduction with Anchored Resistance  - 1 x daily - 7 x weekly - 3 sets - 10 reps  ASSESSMENT:  CLINICAL IMPRESSION: Pt demonstrates decreased low back and LE pain, improved B hip strength and function since initial evaluation. Pt has made very good progress with PT towards goals. Skilled PT services discharged with pt continuing progress with her exercises at home.        OBJECTIVE IMPAIRMENTS: Abnormal gait, decreased balance, difficulty walking, decreased strength, improper body mechanics, postural dysfunction, and pain.   ACTIVITY LIMITATIONS: carrying, lifting, bending, sitting, standing, squatting, sleeping, stairs, transfers, bed mobility, dressing, hygiene/grooming, and locomotion level  PARTICIPATION LIMITATIONS:   PERSONAL FACTORS: Age, Fitness, Time since onset of injury/illness/exacerbation, and 3+ comorbidities: anxiety, arthritis, depression, DM, HTN, sleep apnea are also affecting patient's functional outcome.    REHAB POTENTIAL: Fair    CLINICAL DECISION MAKING: Stable/uncomplicated Pain has worsened since onset per pt.   EVALUATION COMPLEXITY: Low   GOALS: Goals reviewed with patient? Yes  SHORT TERM GOALS: Target date: 05/07/2024  Pt will be independent with her initial HEP to decrease pain, improve strength, function, and ability to perform standing tasks more comfortably.  Baseline: Pt has started her initial HEP (04/22/2024); doing her HEP, no questions (06/02/2024) Goal status: MET   LONG TERM GOALS: Target date: 08/06/2024  Pt will have a decrease in low back pain to 4/10 or less at worst to promote ability to perform standing tasks more comfortably.  Baseline:8/10 low back pain at worst for the past 3 months (04/22/2024); 5/10 back pain at most for the past 7 days, also not currently wearing her back brace (06/02/2024), 5/10 at worst but when she vacuums, back pain goes up to a 6/10. Currently no longer using her back brace (06/07/2024); 6/10 at worst for the past 7 days (06/15/2024); 5/10 at most for the past 7 days (07/05/2024); 07/12/24: 5/10 NPS consistently; 0/10 at most for the past 7 days (08/06/2024) Goal status: MET  2.  Pt will have a decrease in R LE pain to 4/10 or less at worst to promote ability to ambulate and perform standing tasks more comfortably.  Baseline: 9/10 R LE pain at worst for the past 3 months. (04/22/2024); 5/10 R LE pain at most for the past 7 days, does not hurt often like it used to (06/02/2024), 5/10 at most but when she vacuums, R LE pain goes up to a 6/10. Currently no longer using her back brace (06/07/2024); 6/10 at most for the past 7 days. (07/05/2024); 0/10 at most for the past 7 days (08/06/2024) Goal status: MET  3.  Pt will improve her Modified Oswestry Low Back Pain Disability Questionnaire by at least 20% as a demonstration of improved function.  Baseline: Modified Oswestry Low Back Pain Disability Questionnaire 62% (04/22/2024); 46 % (06/07/2024); 07/12/24:  36% Goal status: MET  4.  Pt will improve her B hip extension and abduction strength by at least 1/2 MMT grade to promote ability to ambulate and perform standing tasks more comfortably.  Baseline:  MMT Right eval Left eval R (06/07/2024) L (06/07/2024)  Hip extension (seated manually resisted) 3+ 3+ 4+ 4  Hip abduction (seated manually resisted) 4- 4- 4+ 4+   (04/22/2024);  Goal status: MET   PLAN:  PT FREQUENCY: 1-2x/week  PT DURATION: 4 weeks  PLANNED INTERVENTIONS: 97110-Therapeutic exercises, 97530- Therapeutic activity, 97112- Neuromuscular re-education, [phone]- Self Care, [id number]- Manual therapy, [phone]- Gait training, [phone]- Aquatic Therapy, [phone]- Electrical stimulation (unattended), [phone]- Traction (mechanical), [id number]- Ionotophoresis 4mg /ml Dexamethasone , Patient/Family education, Balance training, Stair training, Dry Needling, Joint mobilization, and Spinal mobilization.  PLAN FOR NEXT SESSION: Posture, trunk and glute strengthening, manual techniques, modalities PRN   Thank you for your referral.  Emil Glassman PT, DPT Physical Therapist- [facility]  [facility] 08/06/2024, 9:09 AM

## 2024-08-09 ENCOUNTER — Ambulatory Visit

## 2024-08-11 ENCOUNTER — Ambulatory Visit

## 2024-08-18 ENCOUNTER — Encounter: Payer: Self-pay | Admitting: Pain Medicine

## 2024-08-18 ENCOUNTER — Ambulatory Visit: Attending: Pain Medicine | Admitting: Pain Medicine

## 2024-08-18 ENCOUNTER — Ambulatory Visit

## 2024-08-18 VITALS — BP 141/67 | HR 84 | Temp 96.0°F | Resp 16 | Ht 60.0 in | Wt 144.0 lb

## 2024-08-18 DIAGNOSIS — Z09 Encounter for follow-up examination after completed treatment for conditions other than malignant neoplasm: Secondary | ICD-10-CM | POA: Insufficient documentation

## 2024-08-18 DIAGNOSIS — M5410 Radiculopathy, site unspecified: Secondary | ICD-10-CM | POA: Diagnosis present

## 2024-08-18 DIAGNOSIS — M5441 Lumbago with sciatica, right side: Secondary | ICD-10-CM | POA: Insufficient documentation

## 2024-08-18 DIAGNOSIS — M79604 Pain in right leg: Secondary | ICD-10-CM | POA: Diagnosis present

## 2024-08-18 DIAGNOSIS — M48061 Spinal stenosis, lumbar region without neurogenic claudication: Secondary | ICD-10-CM | POA: Insufficient documentation

## 2024-08-18 DIAGNOSIS — M4186 Other forms of scoliosis, lumbar region: Secondary | ICD-10-CM | POA: Insufficient documentation

## 2024-08-18 DIAGNOSIS — M4807 Spinal stenosis, lumbosacral region: Secondary | ICD-10-CM | POA: Insufficient documentation

## 2024-08-18 DIAGNOSIS — M25661 Stiffness of right knee, not elsewhere classified: Secondary | ICD-10-CM | POA: Diagnosis present

## 2024-08-18 DIAGNOSIS — G8929 Other chronic pain: Secondary | ICD-10-CM | POA: Insufficient documentation

## 2024-08-18 DIAGNOSIS — M25651 Stiffness of right hip, not elsewhere classified: Secondary | ICD-10-CM | POA: Diagnosis present

## 2024-08-18 DIAGNOSIS — M5126 Other intervertebral disc displacement, lumbar region: Secondary | ICD-10-CM | POA: Insufficient documentation

## 2024-08-18 NOTE — Progress Notes (Signed)
 PROVIDER NOTE: Interpretation of information contained herein should be left to medically-trained personnel. Specific patient instructions are provided elsewhere under Patient Instructions section of medical record. This document was created in part using AI and STT-dictation technology, any transcriptional errors that may result from this process are unintentional.  Patient: Emma White  Service: E/M   PCP: Autry Grayce LABOR, PA  DOB: 03-09-1956  DOS: 08/18/2024  Provider: Eric LABOR Como, MD  MRN: [id number]  Delivery: Face-to-face  Specialty: Interventional Pain Management  Type: Established Patient  Setting: Ambulatory outpatient facility  Specialty designation: 09  Referring Prov.: Autry Grayce LABOR, PA  Location: Outpatient office facility       History of present illness (HPI) Emma White, a 68 y.o. year old female, is here today because of her Chronic bilateral low back pain with right-sided sciatica [G89.29, M54.41]. 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); Shoulder pain (Right); Peripheral neuropathy; Type 2 diabetes mellitus with diabetic polyneuropathy, with long-term current use of insulin  (HCC); Chronic knee pain (Bilateral) (R>L); Chronic hip pain (Right); Impaired range of motion of hip (Right); Chronic lower extremity pain (Right); Impaired range of motion of knee (Right); Abnormal MRI, lumbar spine [facility]) (03/05/2024); Lumbosacral radiculopathy at L5 (Right); Chronic radicular pain of lower extremity (Right); 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, L4-5); Foraminal stenosis of lumbosacral region (L5-S1) (SEVERE  on Right); 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); Lumbar facet arthropathy (Bilateral: L2-3, L3-4, L4-5,  L5-S1); Lumbar facet joint pain (Bilateral); Low back pain of over 3 months duration; Low back pain potentially associated with radiculopathy; Low back pain radiating to leg (Right); Multifactorial low back pain; 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  Pain Assessment: Severity of Chronic pain is reported as a 3 /10. Location: Hip Right/right buttock and shin. Onset: More than a month ago. Quality: Aching, Discomfort. Timing: Intermittent. Modifying factor(s): rest, ice. Vitals:  height is 5' (1.524 m) and weight is 144 lb (65.3 kg). Her temperature is 96 F (35.6 C) (abnormal). Her blood pressure is 141/67 (abnormal) and her pulse is 84. Her respiration is 16 and oxygen saturation is 97%.  BMI: Estimated body mass index is 28.12 kg/m as calculated from the following:   Height as of this encounter: 5' (1.524 m).   Weight as of this encounter: 144 lb (65.3 kg).  Last encounter: 07/12/2024. Last procedure: 08/03/2024.  Reason for encounter: post-procedure evaluation and assessment.   Discussed the use of AI scribe software for clinical note transcription with the patient, who gave verbal consent to proceed.  History of Present Illness   Emma White is a 68 year old female with lumbar facet arthropathy and foraminal stenosis who presents with recurrent right-sided sciatic pain.  She underwent right-sided L4 and L5 transforaminal epidural steroid injections on August 03, 2024. Prior to the injection, she experienced bilateral lower back pain with right-sided sciatica affecting the right lower extremity. Post-injection, she had complete pain relief during the local  anesthetic's effect, lasting through the weekend and  into the following week until Wednesday. Her pain returned after lifting a heavy object while rotating her lumbar spine.  An MRI of the lumbar spine on March 05, 2024, shows extensive bilateral lumbar facet arthropathy, lateral recess and foraminal stenosis, most severe at L5-S1. There is a disc bulge with right subarticular and foraminal extrusion, with superior migration into the right L5-S1 neuroforamen.      Post-Procedure Evaluation   Procedure: Lumbar trans-foraminal epidural steroid injection (L-TFESI) #1  Laterality: Right (-RT)  Level: L4 & L5 nerve root(s) Imaging: Fluoroscopy-guided Spinal ([id number]) Anesthesia: Local anesthesia (1-2% Lidocaine ) Anxiolysis: IV Versed          Sedation: Minimal Sedation                       DOS: 08/03/2024  Performed by: Eric DELENA Como, MD  Purpose: Diagnostic/Therapeutic Indications: Lumbar radicular pain severe enough to impact quality of life or function. 1. 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 Chronic lower extremity pain (Right)   3. Chronic radicular pain of lower extremity (Right)   4. 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 Foraminal stenosis of lumbosacral region (L5-S1) (SEVERE  on Right)   7. Chronic hip pain (Right)   8. 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 Low back pain of over 3 months duration   10. Low back pain potentially associated with radiculopathy   11. Low back pain radiating to leg (Right)   12. Lumbar facet hypertrophy (Bilateral: L4-5, L5-S1)   13. 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 Lumbosacral radiculopathy at L5 (Right)    NAS-11 Pain score:   Pre-procedure: 3 /10   Post-procedure: 0-No pain/10     Effectiveness:  Initial hour after procedure: 100 %. Subsequent 4-6 hours post-procedure: 100 %. Analgesia past  initial 6 hours: 100 % (week). Ongoing improvement:  Analgesic: The patient refers having attained 100% relief of the pain for the duration of the local anesthetic which actually persisted for over 1 week.  Unfortunately, she picked up a heavy bag of coins and this led to the return of the pain. Function: Transient improvement ROM: Transient improvement  Pharmacotherapy Assessment   Opioid Analgesic: None MME/day: 0 mg/day   Monitoring: [place] PMP: PDMP reviewed during this encounter.       Pharmacotherapy: No side-effects or adverse reactions reported. Compliance: No problems identified. Effectiveness: Clinically acceptable.  Dorlene Montie FALCON, RN  08/18/2024  1:49 PM  Sign when Signing Visit Safety precautions to be maintained throughout the outpatient stay will include: orient to surroundings, keep bed in low position, maintain call bell within reach at all times, provide assistance with transfer out of bed and ambulation.     UDS:  Summary  Date Value Ref Range Status  06/23/2024 FINAL  Final    Comment:    ==================================================================== Compliance Drug Analysis, Ur ==================================================================== Test                             Result       Flag       Units  Drug Present and Declared for Prescription Verification   Gabapentin                      PRESENT  EXPECTED   Citalopram                      PRESENT      EXPECTED   Desmethylcitalopram            PRESENT      EXPECTED    Desmethylcitalopram is an expected metabolite of citalopram  or the    enantiomeric form, escitalopram.    Acetaminophen                   PRESENT      EXPECTED  Drug Absent but Declared for Prescription Verification   Salicylate                     Not Detected UNEXPECTED    Aspirin , as indicated in the declared medication list, is not always    detected even when used as directed.    Ibuprofen                      Not  Detected UNEXPECTED    Ibuprofen, as indicated in the declared medication list, is not    always detected even when used as directed.  ==================================================================== Test                      Result    Flag   Units      Ref Range   Creatinine              129              mg/dL      >=79 ==================================================================== Declared Medications:  The flagging and interpretation on this report are based on the  following declared medications.  Unexpected results may arise from  inaccuracies in the declared medications.   **Note: The testing scope of this panel includes these medications:   Citalopram  (Celexa )  Gabapentin  (Neurontin )   **Note: The testing scope of this panel does not include small to  moderate amounts of these reported medications:   Acetaminophen  (Tylenol )  Aspirin   Ibuprofen (Advil)   **Note: The testing scope of this panel does not include the  following reported medications:   Amlodipine  (Norvasc )  Benzonatate (Tessalon)  Insulin  (NovoLog )  Metformin (Glucophage)  Pantoprazole  (Protonix )  Potassium  Pravastatin  (Pravachol )  Semaglutide  Sitagliptin (Januvia)  Vitamin D3 ==================================================================== For clinical consultation, please call [phone]. ====================================================================     No results found for: CBDTHCR No results found for: D8THCCBX No results found for: D9THCCBX  ROS  Constitutional: Denies any fever or chills Gastrointestinal: No reported hemesis, hematochezia, vomiting, or acute GI distress Musculoskeletal: Denies any acute onset joint swelling, redness, loss of ROM, or weakness Neurological: No reported episodes of acute onset apraxia, aphasia, dysarthria, agnosia, amnesia, paralysis, loss of coordination, or loss of consciousness  Medication Review  Semaglutide, Vitamin D3,  acetaminophen , amLODipine , aspirin , benzonatate, citalopram , gabapentin , ibuprofen, metFORMIN, pantoprazole , and pravastatin   History Review  Allergy: Ms. Kroening has no known allergies. Drug: Ms. Cara  reports no history of drug use. Alcohol:  reports no history of alcohol use. Tobacco:  reports that she has never smoked. She has never used smokeless tobacco. Social: Ms. Penland  reports that she has never smoked. She has never used smokeless tobacco. She reports that she does not drink alcohol and does not use drugs. Medical:  has a past medical history of Anxiety, Arthritis, Complication of anesthesia (03/18/2022), Depression, Diabetes mellitus without complication (HCC),  Dyspnea, Hyperlipemia, Hypertension, Neuropathy, and Sleep apnea. Surgical: Ms. Fellenz  has a past surgical history that includes Knee arthrocentesis (Left, 1981); Knee arthrocentesis (Bilateral, 1992); HYSTERECTOMY SUPRACERVICAL ABDOMINAL W/REMOVAL TUBES &/OR OVARIES (1995); Abdominal hysterectomy (1995); Knee arthrocentesis (Bilateral); Colonoscopy with propofol  (N/A, 05/27/2016); Cataract extraction w/PHACO (Left, 09/25/2017); Eye surgery (Left, 09/25/2017); Cataract extraction w/PHACO (Right, 10/16/2017); Kenolog injection (Right, 10/16/2017); Esophagogastroduodenoscopy (egd) with propofol  (N/A, 07/10/2021); Colonoscopy with propofol  (N/A, 07/10/2021); Esophagogastroduodenoscopy (egd) with propofol  (N/A, 10/30/2021); Colonoscopy with propofol  (N/A, 10/30/2021); Colonoscopy with propofol  (N/A, 02/05/2022); Xi robotic laparoscopic assisted appendectomy (N/A, 03/18/2022); Colonoscopy with propofol  (N/A, 07/10/2023); polypectomy (07/10/2023); biopsy (07/10/2023); and Appendectomy. Family: family history is not on file.  Laboratory Chemistry Profile   Renal Lab Results  Component Value Date   BUN 13 06/23/2024   CREATININE 0.75 06/23/2024   BCR 17 06/23/2024   GFRNONAA >60 03/19/2022    Hepatic Lab Results  Component Value Date    AST 18 06/23/2024   ALBUMIN 4.6 06/23/2024   ALKPHOS 73 06/23/2024    Electrolytes Lab Results  Component Value Date   NA 140 06/23/2024   K 4.3 06/23/2024   CL 101 06/23/2024   CALCIUM 9.8 06/23/2024   MG 1.6 06/23/2024    Bone Lab Results  Component Value Date   25OHVITD1 57 06/23/2024   25OHVITD2 <1.0 06/23/2024   25OHVITD3 56 06/23/2024    Inflammation (CRP: Acute Phase) (ESR: Chronic Phase) Lab Results  Component Value Date   CRP 1 06/23/2024   ESRSEDRATE 11 06/23/2024         Note: Above Lab results reviewed.  Recent Imaging Review  DG PAIN CLINIC C-ARM 1-60 MIN NO REPORT Fluoro was used, but no Radiologist interpretation will be provided.  Please refer to NOTES tab for provider progress note. Note: Reviewed        Physical Exam  Vitals: BP (!) 141/67   Pulse 84   Temp (!) 96 F (35.6 C)   Resp 16   Ht 5' (1.524 m)   Wt 144 lb (65.3 kg)   SpO2 97%   BMI 28.12 kg/m  BMI: Estimated body mass index is 28.12 kg/m as calculated from the following:   Height as of this encounter: 5' (1.524 m).   Weight as of this encounter: 144 lb (65.3 kg). Ideal: Ideal body weight: 45.5 kg (100 lb 4.9 oz) Adjusted ideal body weight: 53.4 kg (117 lb 12.6 oz) General appearance: Well nourished, well developed, and well hydrated. In no apparent acute distress Mental status: Alert, oriented x 3 (person, place, & time)       Respiratory: No evidence of acute respiratory distress Eyes: PERLA   Assessment   Diagnosis Status  1. 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 Chronic lower extremity pain (Right)   3. Chronic radicular pain of lower extremity (Right)   4. Postop check   5. 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 Foraminal stenosis of lumbosacral region (L5-S1) (SEVERE  on Right)   8. Impaired range of motion of hip (Right)   9. Impaired range of motion of knee  (Right)   10. 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 Previously, a right-sided L4 and L5 transforaminal epidural steroid injection provided complete pain relief for a short period. Pain returned after lifting a heavy object and rotating her lumbar spine. The goal is to manage pain and allow potential spontaneous resolution of the disc extrusion. No adverse effects were noted from the previous injection. Repeat the right-sided L4 and L5 transforaminal epidural steroid injection. Advise avoiding heavy lifting and lumbar spine rotation.  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 The treatment plan is conservative, focusing on symptom alleviation. Repeat the right-sided L4 and L5 transforaminal epidural steroid injection. Advise avoiding heavy lifting and lumbar spine rotation.  Chronic right lower back and right leg pain   Chronic pain in the right lower back and leg is linked to lumbar disc extrusion and foraminal stenosis. Previous epidural steroid injection provided temporary relief, but pain recurred after physical strain. The treatment plan emphasizes pain management and preventing symptom exacerbation. Repeat the right-sided L4 and L5 transforaminal epidural steroid injection. Advise avoiding heavy lifting and lumbar spine rotation.       Ms. Jameica Couts Weldon has a current medication list which includes the following long-term  medication(s): amlodipine , citalopram , gabapentin , metformin, pantoprazole , and pravastatin .  Pharmacotherapy (Medications Ordered): No orders of the defined types were placed in this encounter.  Orders:  Orders Placed This Encounter  Procedures   Lumbar Transforaminal Epidural    Standing Status:   Future    Expiration Date:   11/17/2024    Scheduling Instructions:     Laterality: Right-sided     Level(s): L5 & L4     Sedation: With Sedation.     Timeframe: As soon as schedule allows.    Where will this procedure be performed?:   [facility]   Nursing Instructions:    Please complete this patient's postprocedure evaluation.    Scheduling Instructions:     Please complete this patient's postprocedure evaluation.     Interventional Therapies  Risk Factors  Considerations  Medical Comorbidities:  Class 2 diastolic-CHF  HTN  GERD  OSA  IDDM  Depression     Planned  Pending:   Therapeutic right L4-5 & L5-S1 TFESI #2    Under consideration:   Therapeutic right L4-5 & L5-S1 TFESI #2  Therapeutic right L4-5 LESI #1  Diagnostic/therapeutic right IA hip joint inj. #1  Diagnostic/therapeutic right IA knee inj. #1  Diagnostic bilateral lumbar facet MBB #1  Diagnostic lower extremity EMG/PNCV    Completed: (Analgesic benefit)1  Therapeutic right L4-5 & L5-S1 TFESI x1 (08/03/2024) (100/100/100 x 1 week/0)   Therapeutic  Palliative (PRN) options:   None established   Completed by other providers:   Oral steroid taper done.  Some improvement, but no complete resolution of symptoms.  Referral to physical therapy done.   1(Analgesic benefit): Expressed in percentage (%). (Local anesthetic[LA] +/- sedation  L.A.Local Anesthetic  Steroid benefit  Ongoing benefit)     Return for (ECT): (R) L4 & L5 TFESI #2.    Recent Visits Date Type Provider Dept  08/03/24 Procedure visit Tanya Glisson, MD [facility]  07/12/24 Office Visit Tanya Glisson, MD  [facility]  06/23/24 Office Visit Tanya Glisson, MD [facility]  Showing recent visits within past 90 days and meeting all other requirements Today's Visits Date Type Provider Dept  08/18/24 Office Visit Tanya Glisson, MD [facility]  Showing today's visits and meeting all other requirements Future Appointments No visits were found meeting these conditions. Showing future appointments within next 90 days and meeting all other requirements  I discussed the assessment and treatment plan with the patient. The patient was provided an opportunity to ask questions and all were answered. The patient agreed with the plan and demonstrated an understanding of the instructions.  Patient advised to call back or seek an in-person evaluation if the symptoms or condition worsens.  Duration of encounter: 30 minutes.  Total time on encounter, as per AMA guidelines included both the face-to-face and non-face-to-face time personally spent by the physician and/or other qualified health care professional(s) on the day of the encounter (includes time in activities that require the physician or other qualified health care professional and does not include time in activities normally performed by clinical staff). Physician's time may include the following activities when performed: Preparing to see the patient (e.g., pre-charting review of records, searching for previously ordered imaging, lab work, and nerve conduction tests) Review of prior analgesic pharmacotherapies. Reviewing PMP Interpreting ordered tests (e.g., lab work, imaging, nerve conduction tests) Performing post-procedure evaluations, including interpretation of diagnostic procedures Obtaining and/or reviewing separately obtained history Performing a medically appropriate examination and/or evaluation Counseling and educating the patient/family/caregiver Ordering medications, tests, or procedures Referring and  communicating with other health care professionals (when not separately reported) Documenting clinical information in the electronic or other health record Independently interpreting results (not separately reported) and communicating results to the patient/ family/caregiver Care coordination (not separately reported)  Note by: Glisson DELENA Tanya, MD (TTS and AI technology used. I apologize for any typographical errors that were not detected and corrected.) Date: 08/18/2024; Time: 2:18 PM

## 2024-08-18 NOTE — Progress Notes (Signed)
 Safety precautions to be maintained throughout the outpatient stay will include: orient to surroundings, keep bed in low position, maintain call bell within reach at all times, provide assistance with transfer out of bed and ambulation.

## 2024-08-18 NOTE — Patient Instructions (Signed)
 ______________________________________________________________________    Procedure instructions  Stop blood-thinners  Do not eat or drink fluids (other than water ) for 6 hours before your procedure  No water  for 2 hours before your procedure  Take your blood pressure medicine with a sip of water   Arrive 30 minutes before your appointment  If sedation is planned, bring suitable driver. [place], [place], & public transportation are NOT APPROVED)  Carefully read the Preparing for your procedure detailed instructions  If you have questions call us  at [phone]  Procedure appointments are for procedures only.   NO medication refills or new problem evaluations will be done on procedure days.   Only the scheduled, pre-approved procedure and side will be done.   ______________________________________________________________________     ______________________________________________________________________    Preparing for your procedure  Appointments: If you think you may not be able to keep your appointment, call 24-48 hours in advance to cancel. We need time to make it available to others.  Procedure visits are for procedures only. During your procedure appointment there will be: NO Prescription Refills*. NO medication changes or discussions*. NO discussion of disability issues*. NO unrelated pain problem evaluations*. NO evaluations to order other pain procedures*. *These will be addressed at a separate and distinct evaluation encounter on the provider's evaluation schedule and not during procedure days.  Instructions: Food intake: Avoid eating anything solid for at least 8 hours prior to your procedure. Clear liquid intake: You may take clear liquids such as water  up to 2 hours prior to your procedure. (No carbonated drinks. No soda.) Transportation: Unless otherwise stated by your physician, bring a driver. (Driver cannot be a Market researcher, Pharmacist, community, or any other form of public  transportation.) Morning Medicines: Except for blood thinners, take all of your other morning medications with a sip of water . Make sure to take your heart and blood pressure medicines. If your blood pressure's lower number is above 100, the case will be rescheduled. Blood thinners: Make sure to stop your blood thinners as instructed.  If you take a blood thinner, but were not instructed to stop it, call our office [phone] and ask to talk to a nurse. Not stopping a blood thinner prior to certain procedures could lead to serious complications. Diabetics on insulin : Notify the staff so that you can be scheduled 1st case in the morning. If your diabetes requires high dose insulin , take only  of your normal insulin  dose the morning of the procedure and notify the staff that you have done so. Preventing infections: Shower with an antibacterial soap the morning of your procedure.  Build-up your immune system: Take 1000 mg of Vitamin C with every meal (3 times a day) the day prior to your procedure. Antibiotics: Inform the nursing staff if you are taking any antibiotics or if you have any conditions that may require antibiotics prior to procedures. (Example: recent joint implants)   Pregnancy: If you are pregnant make sure to notify the nursing staff. Not doing so may result in injury to the fetus, including death.  Sickness: If you have a cold, fever, or any active infections, call and cancel or reschedule your procedure. Receiving steroids while having an infection may result in complications. Arrival: You must be in the facility at least 30 minutes prior to your scheduled procedure. Tardiness: Your scheduled time is also the cutoff time. If you do not arrive at least 15 minutes prior to your procedure, you will be rescheduled.  Children: Do not bring any children with  you. Make arrangements to keep them home. Dress appropriately: There is always a possibility that your clothing may get soiled. Avoid  long dresses. Valuables: Do not bring any jewelry or valuables.  Reasons to call and reschedule or cancel your procedure: (Following these recommendations will minimize the risk of a serious complication.) Surgeries: Avoid having procedures within 2 weeks of any surgery. (Avoid for 2 weeks before or after any surgery). Flu Shots: Avoid having procedures within 2 weeks of a flu shots or . (Avoid for 2 weeks before or after immunizations). Barium: Avoid having a procedure within 7-10 days after having had a radiological study involving the use of radiological contrast. (Myelograms, Barium swallow or enema study). Heart attacks: Avoid any elective procedures or surgeries for the initial 6 months after a Myocardial Infarction (Heart Attack). Blood thinners: It is imperative that you stop these medications before procedures. Let us  know if you if you take any blood thinner.  Infection: Avoid procedures during or within two weeks of an infection (including chest colds or gastrointestinal problems). Symptoms associated with infections include: Localized redness, fever, chills, night sweats or profuse sweating, burning sensation when voiding, cough, congestion, stuffiness, runny nose, sore throat, diarrhea, nausea, vomiting, cold or Flu symptoms, recent or current infections. It is specially important if the infection is over the area that we intend to treat. Heart and lung problems: Symptoms that may suggest an active cardiopulmonary problem include: cough, chest pain, breathing difficulties or shortness of breath, dizziness, ankle swelling, uncontrolled high or unusually low blood pressure, and/or palpitations. If you are experiencing any of these symptoms, cancel your procedure and contact your primary care physician for an evaluation.  Remember:  Regular Business hours are:  Monday to Thursday 8:00 AM to 4:00 PM  Provider's Schedule: Eric Como, MD:  Procedure days: Tuesday and Thursday 7:30  AM to 4:00 PM  Wallie Sherry, MD:  Procedure days: Monday and Wednesday 7:30 AM to 4:00 PM Last  Updated: 11/25/2023 ______________________________________________________________________     ______________________________________________________________________    General Risks and Possible Complications  Patient Responsibilities: It is important that you read this as it is part of your informed consent. It is our duty to inform you of the risks and possible complications associated with treatments offered to you. It is your responsibility as a patient to read this and to ask questions about anything that is not clear or that you believe was not covered in this document.  Patient's Rights: You have the right to refuse treatment. You also have the right to change your mind, even after initially having agreed to have the treatment done. However, under this last option, if you wait until the last second to change your mind, you may be charged for the materials used up to that point.  Introduction: Medicine is not an Visual merchandiser. Everything in Medicine, including the lack of treatment(s), carries the potential for danger, harm, or loss (which is by definition: Risk). In Medicine, a complication is a secondary problem, condition, or disease that can aggravate an already existing one. All treatments carry the risk of possible complications. The fact that a side effects or complications occurs, does not imply that the treatment was conducted incorrectly. It must be clearly understood that these can happen even when everything is done following the highest safety standards.  No treatment: You can choose not to proceed with the proposed treatment alternative. The "PRO(s)" would include: avoiding the risk of complications associated with the therapy. The "CON(s)" would include:  not getting any of the treatment benefits. These benefits fall under one of three categories: diagnostic; therapeutic; and/or  palliative. Diagnostic benefits include: getting information which can ultimately lead to improvement of the disease or symptom(s). Therapeutic benefits are those associated with the successful treatment of the disease. Finally, palliative benefits are those related to the decrease of the primary symptoms, without necessarily curing the condition (example: decreasing the pain from a flare-up of a chronic condition, such as incurable terminal cancer).  General Risks and Complications: These are associated to most interventional treatments. They can occur alone, or in combination. They fall under one of the following six (6) categories: no benefit or worsening of symptoms; bleeding; infection; nerve damage; allergic reactions; and/or death. No benefits or worsening of symptoms: In Medicine there are no guarantees, only probabilities. No healthcare provider can ever guarantee that a medical treatment will work, they can only state the probability that it may. Furthermore, there is always the possibility that the condition may worsen, either directly, or indirectly, as a consequence of the treatment. Bleeding: This is more common if the patient is taking a blood thinner, either prescription or over the counter (example: Goody Powders, Fish oil, Aspirin, Garlic, etc.), or if suffering a condition associated with impaired coagulation (example: Hemophilia, cirrhosis of the liver, low platelet counts, etc.). However, even if you do not have one on these, it can still happen. If you have any of these conditions, or take one of these drugs, make sure to notify your treating physician. Infection: This is more common in patients with a compromised immune system, either due to disease (example: diabetes, cancer, human immunodeficiency virus [HIV], etc.), or due to medications or treatments (example: therapies used to treat cancer and rheumatological diseases). However, even if you do not have one on these, it can still  happen. If you have any of these conditions, or take one of these drugs, make sure to notify your treating physician. Nerve Damage: This is more common when the treatment is an invasive one, but it can also happen with the use of medications, such as those used in the treatment of cancer. The damage can occur to small secondary nerves, or to large primary ones, such as those in the spinal cord and brain. This damage may be temporary or permanent and it may lead to impairments that can range from temporary numbness to permanent paralysis and/or brain death. Allergic Reactions: Any time a substance or material comes in contact with our body, there is the possibility of an allergic reaction. These can range from a mild skin rash (contact dermatitis) to a severe systemic reaction (anaphylactic reaction), which can result in death. Death: In general, any medical intervention can result in death, most of the time due to an unforeseen complication. ______________________________________________________________________      ______________________________________________________________________    Steroid injections  Common steroids for injections Triamcinolone: Used by many sports medicine physicians for large joint and bursal injections, often combined with a local anesthetic like lidocaine . A study focusing on coccydynia (tailbone pain) found triamcinolone was more effective than betamethasone , suggesting it may also be preferable for other localized inflammation conditions. Methylprednisolone: A common alternative to triamcinolone that is also a strong anti-inflammatory. It is available in different formulations, with the acetate suspension being the long-acting option for intra-articular injections. Dexamethasone : This is a non-particulate steroid, meaning it has a lower risk of tissue damage compared to particulate steroids like triamcinolone and methylprednisolone. While less common for this specific  use,  it is an option for targeted injections.   Considerations for physicians Particulate vs. non-particulate steroids: Triamcinolone and methylprednisolone are particulate, meaning they can clump together. Dexamethasone  is non-particulate. Particulate steroids are often preferred for their longer-lasting effects but carry a theoretical higher risk for certain injections (though this is less of a concern in the costochondral joints). Combined injectate: Corticosteroids are typically mixed with a local anesthetic like lidocaine  to provide both immediate pain relief (from the anesthetic) and longer-term inflammation reduction (from the steroid). Imaging guidance: To ensure accurate placement of the needle and medication, physicians may use ultrasound or fluoroscopic guidance for the injection, especially in complex or refractory cases.   Patient guidance Before undergoing a steroid injection, discuss the options with your physician. They will determine the best steroid, dosage, and procedure for your specific case based on factors like: Severity of your condition History of response to other treatments Your overall health status Experience and preference of the physician  Last  Updated: 08/10/2024 ______________________________________________________________________

## 2024-08-25 ENCOUNTER — Encounter

## 2024-08-27 ENCOUNTER — Encounter

## 2024-09-07 ENCOUNTER — Encounter: Payer: Self-pay | Admitting: Pain Medicine

## 2024-09-07 ENCOUNTER — Ambulatory Visit (HOSPITAL_BASED_OUTPATIENT_CLINIC_OR_DEPARTMENT_OTHER): Admitting: Pain Medicine

## 2024-09-07 ENCOUNTER — Ambulatory Visit
Admission: RE | Admit: 2024-09-07 | Discharge: 2024-09-07 | Disposition: A | Discharge status: Home / Self Care | Source: Ambulatory Visit | Attending: Pain Medicine | Admitting: Pain Medicine

## 2024-09-07 VITALS — BP 120/56 | HR 77 | Temp 98.0°F | Resp 18 | Ht 60.0 in | Wt 144.0 lb

## 2024-09-07 DIAGNOSIS — M48061 Spinal stenosis, lumbar region without neurogenic claudication: Secondary | ICD-10-CM

## 2024-09-07 DIAGNOSIS — M4186 Other forms of scoliosis, lumbar region: Secondary | ICD-10-CM | POA: Diagnosis present

## 2024-09-07 DIAGNOSIS — M4807 Spinal stenosis, lumbosacral region: Secondary | ICD-10-CM | POA: Diagnosis present

## 2024-09-07 DIAGNOSIS — M79604 Pain in right leg: Secondary | ICD-10-CM | POA: Insufficient documentation

## 2024-09-07 DIAGNOSIS — M5441 Lumbago with sciatica, right side: Secondary | ICD-10-CM

## 2024-09-07 DIAGNOSIS — M51372 Other intervertebral disc degeneration, lumbosacral region with discogenic back pain and lower extremity pain: Secondary | ICD-10-CM | POA: Diagnosis present

## 2024-09-07 DIAGNOSIS — M5126 Other intervertebral disc displacement, lumbar region: Secondary | ICD-10-CM | POA: Diagnosis present

## 2024-09-07 DIAGNOSIS — M545 Low back pain, unspecified: Secondary | ICD-10-CM | POA: Diagnosis present

## 2024-09-07 DIAGNOSIS — G8929 Other chronic pain: Secondary | ICD-10-CM | POA: Diagnosis not present

## 2024-09-07 DIAGNOSIS — M5410 Radiculopathy, site unspecified: Secondary | ICD-10-CM | POA: Insufficient documentation

## 2024-09-07 DIAGNOSIS — M5417 Radiculopathy, lumbosacral region: Secondary | ICD-10-CM

## 2024-09-07 MED ORDER — IOHEXOL 180 MG/ML  SOLN
INTRAMUSCULAR | Status: AC
  Filled 2024-09-07: qty 10

## 2024-09-07 MED ORDER — ROPIVACAINE HCL 2 MG/ML IJ SOLN
2.0000 mL | Freq: Once | INTRAMUSCULAR | Status: AC
  Administered 2024-09-07: 2 mL via EPIDURAL

## 2024-09-07 MED ORDER — PENTAFLUOROPROP-TETRAFLUOROETH EX AERO
INHALATION_SPRAY | Freq: Once | CUTANEOUS | Status: AC
  Administered 2024-09-07: 30 via TOPICAL

## 2024-09-07 MED ORDER — DEXAMETHASONE SODIUM PHOSPHATE 10 MG/ML IJ SOLN
INTRAMUSCULAR | Status: AC
  Filled 2024-09-07: qty 2

## 2024-09-07 MED ORDER — LIDOCAINE HCL 2 % IJ SOLN
20.0000 mL | Freq: Once | INTRAMUSCULAR | Status: AC
  Administered 2024-09-07: 100 mg

## 2024-09-07 MED ORDER — ROPIVACAINE HCL 2 MG/ML IJ SOLN
INTRAMUSCULAR | Status: AC
  Filled 2024-09-07: qty 20

## 2024-09-07 MED ORDER — FENTANYL CITRATE (PF) 100 MCG/2ML IJ SOLN
INTRAMUSCULAR | Status: AC
  Filled 2024-09-07: qty 2

## 2024-09-07 MED ORDER — SODIUM CHLORIDE 0.9% FLUSH
2.0000 mL | Freq: Once | INTRAVENOUS | Status: AC
  Administered 2024-09-07: 2 mL

## 2024-09-07 MED ORDER — FENTANYL CITRATE (PF) 100 MCG/2ML IJ SOLN: 25.0000 ug | INTRAMUSCULAR | Status: DC | PRN

## 2024-09-07 MED ORDER — LIDOCAINE HCL (PF) 2 % IJ SOLN
INTRAMUSCULAR | Status: AC
  Filled 2024-09-07: qty 10

## 2024-09-07 MED ORDER — MIDAZOLAM HCL 5 MG/5ML IJ SOLN
INTRAMUSCULAR | Status: AC
  Filled 2024-09-07: qty 5

## 2024-09-07 MED ORDER — IOHEXOL 180 MG/ML  SOLN
10.0000 mL | Freq: Once | INTRAMUSCULAR | Status: AC
  Administered 2024-09-07: 10 mL via INTRATHECAL

## 2024-09-07 MED ORDER — SODIUM CHLORIDE (PF) 0.9 % IJ SOLN
INTRAMUSCULAR | Status: AC
  Filled 2024-09-07: qty 10

## 2024-09-07 MED ORDER — DEXAMETHASONE SODIUM PHOSPHATE 10 MG/ML IJ SOLN
20.0000 mg | Freq: Once | INTRAMUSCULAR | Status: AC
  Administered 2024-09-07: 20 mg

## 2024-09-07 MED ORDER — MIDAZOLAM HCL 5 MG/5ML IJ SOLN
0.5000 mg | Freq: Once | INTRAMUSCULAR | Status: AC
  Administered 2024-09-07: 2 mg via INTRAVENOUS

## 2024-09-07 NOTE — Patient Instructions (Signed)

## 2024-09-07 NOTE — Progress Notes (Signed)
 PROVIDER NOTE: Interpretation of information contained herein should be left to medically-trained personnel. Specific patient instructions are provided elsewhere under Patient Instructions section of medical record. This document was created in part using STT-dictation technology, any transcriptional errors that may result from this process are unintentional.  Patient: Emma White Type: Established DOB: [birth year] MRN: [id number] PCP: Autry Grayce LABOR, PA  Service: Procedure DOS: 09/07/2024 Setting: Ambulatory Location: Ambulatory outpatient facility Delivery: Face-to-face Provider: Eric LABOR Como, MD Specialty: Interventional Pain Management Specialty designation: 09 Location: Outpatient facility Ref. Prov.: Autry Grayce LABOR, PA       Interventional Therapy   Procedure: Lumbar trans-foraminal epidural steroid injection (L-TFESI) #2  Laterality: Right (-RT)  Level: L4 & L5 nerve root(s) Imaging: Fluoroscopy-guided Spinal ([id number]) Anesthesia: Local anesthesia (1-2% Lidocaine ) Anxiolysis: IV Versed  2.0 mg Sedation: Minimal Sedation None required. No Fentanyl  administered.         DOS: 09/07/2024  Performed by: Eric LABOR Como, MD  Purpose: Diagnostic/Therapeutic Indications: Lumbar radicular pain severe enough to impact quality of life or function. 1. 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 Chronic radicular pain of lower extremity (Right)   3. Chronic lower extremity pain (Right)   4. 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 DDD of lumbosacral region w/ LBP & LEP   7. Foraminal stenosis of lumbosacral region (L5-S1) (SEVERE  on Right)   8. 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 Low back pain of over 3 months duration   10. Low back pain potentially associated with radiculopathy   11. Low back pain radiating to leg (Right)   12. 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 Lumbosacral radiculopathy at L5 (Right)    NAS-11 Pain score:   Pre-procedure: 3 /10   Post-procedure: 0-No pain/10     Position / Prep / Materials:  Position: Prone  Prep solution: ChloraPrep (2% chlorhexidine  gluconate and 70% isopropyl alcohol) Prep Area: Entire Posterior Lumbosacral Area.  From the lower tip of the scapula down to the tailbone and from flank to flank. Materials:  Tray: Block Needle(s):  Type: Spinal  Gauge (G): 22  Length: 5-in  Qty: 2     H&P (Pre-op Assessment):  Emma White is a 68 y.o. (year old), female patient, seen today for interventional treatment. She  has a past surgical history that includes Knee arthrocentesis (Left, 1981); Knee arthrocentesis (Bilateral, 1992); HYSTERECTOMY SUPRACERVICAL ABDOMINAL W/REMOVAL TUBES &/OR OVARIES (1995); Abdominal hysterectomy (1995); Knee arthrocentesis (Bilateral); Colonoscopy with propofol  (N/A, 05/27/2016); Cataract extraction w/PHACO (Left, 09/25/2017); Eye surgery (Left, 09/25/2017); Cataract extraction w/PHACO (Right, 10/16/2017); Kenolog injection (Right, 10/16/2017); Esophagogastroduodenoscopy (egd) with propofol  (N/A, 07/10/2021); Colonoscopy with propofol  (N/A, 07/10/2021); Esophagogastroduodenoscopy (egd) with propofol  (N/A, 10/30/2021); Colonoscopy with propofol  (N/A, 10/30/2021); Colonoscopy with propofol  (N/A, 02/05/2022); Xi robotic laparoscopic assisted appendectomy (N/A, 03/18/2022); Colonoscopy with propofol  (N/A, 07/10/2023); polypectomy (07/10/2023); biopsy (07/10/2023); and Appendectomy. Emma White has a current medication list which includes the following prescription(s): acetaminophen , amlodipine , aspirin , benzonatate, vitamin d3, citalopram , gabapentin , ibuprofen, metformin, pantoprazole , pravastatin , and semaglutide, and the following Facility-Administered Medications: fentanyl . Her primarily concern today is the Back Pain (low)  Initial Vital Signs:  Pulse/HCG Rate: 77ECG  Heart Rate: 73 (nsr) Temp: 98 F (36.7 C) Resp: 16 BP: (!) 146/68 SpO2: 97 %  BMI: Estimated body mass index is 28.12 kg/m as calculated from the following:   Height as of  this encounter: 5' (1.524 m).   Weight as of this encounter: 144 lb (65.3 kg).  Risk Assessment: Allergies: Reviewed. She has no known allergies.  Allergy Precautions: None required Coagulopathies: Reviewed. None identified.  Blood-thinner therapy: None at this time Active Infection(s): Reviewed. None identified. Emma White is afebrile  Site Confirmation: Emma White was asked to confirm the procedure and laterality before marking the site Procedure checklist: Completed Consent: Before the procedure and under the influence of no sedative(s), amnesic(s), or anxiolytics, the patient was informed of the treatment options, risks and possible complications. To fulfill our ethical and legal obligations, as recommended by the American Medical Association's Code of Ethics, I have informed the patient of my clinical impression; the nature and purpose of the treatment or procedure; the risks, benefits, and possible complications of the intervention; the alternatives, including doing nothing; the risk(s) and benefit(s) of the alternative treatment(s) or procedure(s); and the risk(s) and benefit(s) of doing nothing. The patient was provided information about the general risks and possible complications associated with the procedure. These may include, but are not limited to: failure to achieve desired goals, infection, bleeding, organ or nerve damage, allergic reactions, paralysis, and death. In addition, the patient was informed of those risks and complications associated to Spine-related procedures, such as failure to decrease pain; infection (i.e.: Meningitis, epidural or intraspinal abscess); bleeding (i.e.: epidural hematoma, subarachnoid hemorrhage, or any other type of intraspinal or peri-dural bleeding); organ or nerve damage  (i.e.: Any type of peripheral nerve, nerve root, or spinal cord injury) with subsequent damage to sensory, motor, and/or autonomic systems, resulting in permanent pain, numbness, and/or weakness of one or several areas of the body; allergic reactions; (i.e.: anaphylactic reaction); and/or death. Furthermore, the patient was informed of those risks and complications associated with the medications. These include, but are not limited to: allergic reactions (i.e.: anaphylactic or anaphylactoid reaction(s)); adrenal axis suppression; blood sugar elevation that in diabetics may result in ketoacidosis or comma; water  retention that in patients with history of congestive heart failure may result in shortness of breath, pulmonary edema, and decompensation with resultant heart failure; weight gain; swelling or edema; medication-induced neural toxicity; particulate matter embolism and blood vessel occlusion with resultant organ, and/or nervous system infarction; and/or aseptic necrosis of one or more joints. Finally, the patient was informed that Medicine is not an exact science; therefore, there is also the possibility of unforeseen or unpredictable risks and/or possible complications that may result in a catastrophic outcome. The patient indicated having understood very clearly. We have given the patient no guarantees and we have made no promises. Enough time was given to the patient to ask questions, all of which were answered to the patient's satisfaction. Emma White has indicated that she wanted to continue with the procedure. Attestation: I, the ordering provider, attest that I have discussed with the patient the benefits, risks, side-effects, alternatives, likelihood of achieving goals, and potential problems during recovery for the procedure that I have provided informed consent. Date  Time: 09/07/2024 10:03 AM  Pre-Procedure Preparation:  Monitoring: As per clinic protocol. Respiration, ETCO2, SpO2, BP, heart  rate and rhythm monitor placed and checked for adequate function Safety Precautions: Patient was assessed for positional comfort and pressure points before starting the procedure. Time-out: I initiated and conducted the Time-out before starting the procedure, as per protocol. The patient was asked to participate by confirming the accuracy of the Time Out information. Verification of the correct person, site, and procedure were performed and confirmed  by me, the nursing staff, and the patient. Time-out conducted as per Joint Commission's Universal Protocol (UP.01.01.01). Time: 1135 Start Time: 1135 hrs.  Description/Narrative of Procedure:          Target: The 6 o'clock position under the pedicle, on the affected side. Region: Posterolateral Lumbosacral Approach: Posterior Percutaneous Paravertebral approach.  Rationale (medical necessity): procedure needed and proper for the diagnosis and/or treatment of the patient's medical symptoms and needs. Procedural Technique Safety Precautions: Aspiration looking for blood return was conducted prior to all injections. At no point did we inject any substances, as a needle was being advanced. No attempts were made at seeking any paresthesias. Safe injection practices and needle disposal techniques used. Medications properly checked for expiration dates. SDV (single dose vial) medications used. Description of the Procedure: Protocol guidelines were followed. The patient was placed in position over the procedure table. The target area was identified and the area prepped in the usual manner. Skin & deeper tissues infiltrated with local anesthetic. Appropriate amount of time allowed to pass for local anesthetics to take effect. The procedure needles were then advanced to the target area. Proper needle placement secured. Negative aspiration confirmed. Solution injected in intermittent fashion, asking for systemic symptoms every 0.5cc of injectate. The needles  were then removed and the area cleansed, making sure to leave some of the prepping solution back to take advantage of its long term bactericidal properties.  Vitals:   09/07/24 1136 09/07/24 1141 09/07/24 1144 09/07/24 1154  BP: (!) 136/55 137/63 137/63 (!) 120/56  Pulse:      Resp: 17 15 18    Temp:      SpO2: 100% 100% 100% 100%  Weight:      Height:        Start Time: 1135 hrs. End Time: 1144 hrs.  Imaging Guidance (Spinal):          Type of Imaging Technique: Fluoroscopy Guidance (Spinal) Indication(s): Fluoroscopy guidance for needle placement to enhance accuracy in procedures requiring precise needle localization for targeted delivery of medication in or near specific anatomical locations not easily accessible without such real-time imaging assistance. Exposure Time: Please see nurses notes. Contrast: Before injecting any contrast, we confirmed that the patient did not have an allergy to iodine , shellfish, or radiological contrast. Once satisfactory needle placement was completed at the desired level, radiological contrast was injected. Contrast injected under live fluoroscopy. No contrast complications. See chart for type and volume of contrast used. Fluoroscopic Guidance: I was personally present during the use of fluoroscopy. Tunnel Vision Technique used to obtain the best possible view of the target area. Parallax error corrected before commencing the procedure. Direction-depth-direction technique used to introduce the needle under continuous pulsed fluoroscopy. Once target was reached, antero-posterior, oblique, and lateral fluoroscopic projection used confirm needle placement in all planes. Images permanently stored in EMR. Interpretation: I personally interpreted the imaging intraoperatively. Adequate needle placement confirmed in multiple planes. Appropriate spread of contrast into desired area was observed. No evidence of afferent or efferent intravascular uptake. No  intrathecal or subarachnoid spread observed. Permanent images saved into the patient's record.  Post-operative Assessment:  Post-procedure Vital Signs:  Pulse/HCG Rate: 7770 Temp: 98 F (36.7 C) Resp: 18 BP: (!) 120/56 SpO2: 100 %  EBL: None  Complications: No immediate post-treatment complications observed by team, or reported by patient.  Note: The patient tolerated the entire procedure well. A repeat set of vitals were taken after the procedure and the patient was kept under observation following  institutional policy, for this type of procedure. Post-procedural neurological assessment was performed, showing return to baseline, prior to discharge. The patient was provided with post-procedure discharge instructions, including a section on how to identify potential problems. Should any problems arise concerning this procedure, the patient was given instructions to immediately contact us , at any time, without hesitation. In any case, we plan to contact the patient by telephone for a follow-up status report regarding this interventional procedure.  Comments:  No additional relevant information.  Plan of Care (POC)  Orders:  Orders Placed This Encounter  Procedures   Lumbar Transforaminal Epidural    Scheduling Instructions:     Laterality: Right-sided     Level(s): L4 & L5     Sedation: With Sedation.     Date: 09/07/2024    Where will this procedure be performed?:   [facility]   [facility] C-ARM 1-60 MIN NO REPORT    Intraoperative interpretation by procedural physician at [facility].    Standing Status:   Standing    Number of Occurrences:   1    Reason for exam::   Assistance in needle guidance and placement for procedures requiring needle placement in or near specific anatomical locations not easily accessible without such assistance.   Informed Consent Details: Physician/Practitioner Attestation; Transcribe to consent form and obtain patient signature     Provider Attestation: I, Eames Dibiasio A. Tanya, MD, (Pain Management Specialist), the physician/practitioner, attest that I have discussed with the patient the benefits, risks, side effects, alternatives, likelihood of achieving goals and potential problems during recovery for the procedure that I have provided informed consent.    Scheduling Instructions:     Note: Always confirm laterality of pain with Emma White, before procedure.     Transcribe to consent form and obtain patient signature.    Physician/Practitioner attestation of informed consent for procedure/surgical case:   I, the physician/practitioner, attest that I have discussed with the patient the benefits, risks, side effects, alternatives, likelihood of achieving goals and potential problems during recovery for the procedure that I have provided informed consent.    Procedure:   Diagnostic lumbar transforaminal epidural steroid injection under fluoroscopic guidance. (See notes for level and laterality.)    Physician/Practitioner performing the procedure:   Vana Arif A. Lanea Vankirk, MD    Indication/Reason:   Lumbar radiculopathy/radiculitis associated with lumbar stenosis   Provide equipment / supplies at bedside    Procedure tray: Block Tray (Disposable  single use) Skin infiltration needle: Regular 1.5-in, 25-G, (x1) Block Needle type: Spinal Amount/quantity: 2 Size: Medium (5-inch) Gauge: 22G    Standing Status:   Standing    Number of Occurrences:   1    Specify:   Block Tray   Saline lock IV    Have LR [phone] mL available and administer at 125 mL/hr if patient becomes hypotensive.    Standing Status:   Standing    Number of Occurrences:   1     Opioid Analgesic: None MME/day: 0 mg/day    Medications ordered for procedure: Meds ordered this encounter  Medications   iohexol  (OMNIPAQUE ) 180 MG/ML injection 10 mL    Must be Myelogram-compatible. If not available, you may substitute with a water -soluble, non-ionic,  hypoallergenic, myelogram-compatible radiological contrast medium.   lidocaine  (XYLOCAINE ) 2 % (with pres) injection 400 mg   pentafluoroprop-tetrafluoroeth (GEBAUERS) aerosol   midazolam  (VERSED ) 5 MG/5ML injection 0.5-2 mg    Make sure Flumazenil is available in the pyxis when  using this medication. If oversedation occurs, administer 0.2 mg IV over 15 sec. If after 45 sec no response, administer 0.2 mg again over 1 min; may repeat at 1 min intervals; not to exceed 4 doses (1 mg)   fentaNYL  (SUBLIMAZE ) injection 25-50 mcg    Make sure Narcan is available in the pyxis when using this medication. In the event of respiratory depression (RR< 8/min): Titrate NARCAN (naloxone) in increments of 0.1 to 0.2 mg IV at 2-3 minute intervals, until desired degree of reversal.   sodium chloride  flush (NS) 0.9 % injection 2 mL    This is for a two (2) level block. Use two (2) syringes and divide content in half.   ropivacaine  (PF) 2 mg/mL (0.2%) (NAROPIN ) injection 2 mL    This is for a two (2) level block. Use two (2) syringes and divide content in half.   dexamethasone  (DECADRON ) injection 20 mg    This is for a two (2) level block. Use two (2) syringes and divide content in half.   Medications administered: We administered iohexol , lidocaine , pentafluoroprop-tetrafluoroeth, midazolam , sodium chloride  flush, ropivacaine  (PF) 2 mg/mL (0.2%), and dexamethasone .  See the medical record for exact dosing, route, and time of administration.    Interventional Therapies  Risk Factors  Considerations  Medical Comorbidities:  Class 2 diastolic-CHF  HTN  GERD  OSA  IDDM  Depression     Planned  Pending:   Therapeutic right L4-5 & L5-S1 TFESI #2    Under consideration:   Therapeutic right L4-5 & L5-S1 TFESI #2  Therapeutic right L4-5 LESI #1  Diagnostic/therapeutic right IA hip joint inj. #1  Diagnostic/therapeutic right IA knee inj. #1  Diagnostic bilateral lumbar facet MBB #1  Diagnostic lower  extremity EMG/PNCV    Completed: (Analgesic benefit)1  Therapeutic right L4-5 & L5-S1 TFESI x1 (08/03/2024) (100/100/100 x 1 week/0)   Therapeutic  Palliative (PRN) options:   None established   Completed by other providers:   Oral steroid taper done.  Some improvement, but no complete resolution of symptoms.  Referral to physical therapy done.   1(Analgesic benefit): Expressed in percentage (%). (Local anesthetic[LA] +/- sedation  L.A.Local Anesthetic  Steroid benefit  Ongoing benefit)      Follow-up plan:   Return in about 2 weeks (around 09/21/2024) for (Face2F), (PPE).     Recent Visits Date Type Provider Dept  08/18/24 Office Visit Tanya Glisson, MD [facility]  08/03/24 Procedure visit Tanya Glisson, MD [facility]  07/12/24 Office Visit Tanya Glisson, MD [facility]  06/23/24 Office Visit Tanya Glisson, MD [facility]  Showing recent visits within past 90 days and meeting all other requirements Today's Visits Date Type Provider Dept  09/07/24 Procedure visit Tanya Glisson, MD [facility]  Showing today's visits and meeting all other requirements Future Appointments Date Type Provider Dept  09/27/24 Appointment Tanya Glisson, MD [facility]  Showing future appointments within next 90 days and meeting all other requirements   Disposition: Discharge home  Discharge (Date  Time): 09/07/2024; 1202 hrs.   Primary Care Physician: Autry Grayce LABOR, PA Location: [facility] Note by: Glisson LABOR Tanya, MD (TTS technology used. I apologize for any typographical errors that were not detected and corrected.) Date: 09/07/2024; Time: 12:16 PM  Disclaimer:  Medicine is not an Visual merchandiser. The only guarantee in medicine is that nothing is guaranteed. It is important to note that the decision to proceed with this  intervention was based on the information  collected from the patient. The Data and conclusions were drawn from the patient's questionnaire, the interview, and the physical examination. Because the information was provided in large part by the patient, it cannot be guaranteed that it has not been purposely or unconsciously manipulated. Every effort has been made to obtain as much relevant data as possible for this evaluation. It is important to note that the conclusions that lead to this procedure are derived in large part from the available data. Always take into account that the treatment will also be dependent on availability of resources and existing treatment guidelines, considered by other Pain Management Practitioners as being common knowledge and practice, at the time of the intervention. For Medico-Legal purposes, it is also important to point out that variation in procedural techniques and pharmacological choices are the acceptable norm. The indications, contraindications, technique, and results of the above procedure should only be interpreted and judged by a Board-Certified Interventional Pain Specialist with extensive familiarity and expertise in the same exact procedure and technique.

## 2024-09-08 ENCOUNTER — Telehealth: Payer: Self-pay

## 2024-09-08 NOTE — Telephone Encounter (Signed)
 Post procedure follow up.  Patient states she is doing good.

## 2024-09-26 NOTE — Progress Notes (Unsigned)
 PROVIDER NOTE: Interpretation of information contained herein should be left to medically-trained personnel. Specific patient instructions are provided elsewhere under Patient Instructions section of medical record. This document was created in part using AI and STT-dictation technology, any transcriptional errors that may result from this process are unintentional.  Patient: Emma White  Service: E/M   PCP: Autry Grayce LABOR, PA  DOB: [birth year]  DOS: 09/27/2024  Provider: Eric LABOR Como, MD  MRN: [id number]  Delivery: Face-to-face  Specialty: Interventional Pain Management  Type: Established Patient  Setting: Ambulatory outpatient facility  Specialty designation: 09  Referring Prov.: Autry Grayce LABOR, PA  Location: Outpatient office facility       History of present illness (HPI) Emma White, a 68 y.o. year old female, is here today because of her Chronic bilateral low back pain with right-sided sciatica [G89.29, M54.41]. Emma White's primary complain today is No chief complaint on file.  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); Shoulder pain (Right); Peripheral neuropathy; Type 2 diabetes mellitus with diabetic polyneuropathy, with long-term current use of insulin  (HCC); Chronic knee pain (Bilateral) (R>L); Chronic hip pain (Right); Impaired range of motion of hip (Right); Chronic lower extremity pain (Right); Impaired range of motion of knee (Right); Abnormal MRI, lumbar spine [facility]) (03/05/2024); Lumbosacral radiculopathy at L5 (Right); Chronic radicular pain of lower extremity (Right); 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, L4-5); Foraminal stenosis of lumbosacral region (L5-S1) (SEVERE  on Right); 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); Lumbar facet arthropathy (Bilateral: L2-3, L3-4, L4-5, L5-S1); Lumbar facet joint pain  (Bilateral); Low back pain of over 3 months duration; Low back pain potentially associated with radiculopathy; Low back pain radiating to leg (Right); Multifactorial low back pain; 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  Pain Assessment: Severity of   is reported as a  /10. Location:    / . Onset:  . Quality:  . Timing:  . Modifying factor(s):  SABRA Vitals:  vitals were not taken for this visit.  BMI: Estimated body mass index is 28.12 kg/m as calculated from the following:   Height as of 09/07/24: 5' (1.524 m).   Weight as of 09/07/24: 144 lb (65.3 kg).  Last encounter: 08/18/2024. Last procedure: 09/07/2024.  Reason for encounter: post-procedure evaluation and assessment.   Discussed the use of AI scribe software for clinical note transcription with the patient, who gave verbal consent to proceed.  History of Present Illness          Post-Procedure Evaluation   Procedure: Lumbar trans-foraminal epidural steroid injection (L-TFESI) #2  Laterality: Right (-RT)  Level: L4 & L5 nerve root(s) Imaging: Fluoroscopy-guided Spinal ([id number]) Anesthesia: Local anesthesia (1-2% Lidocaine ) Anxiolysis: IV Versed  2.0 mg Sedation: Minimal Sedation None required. No Fentanyl  administered.         DOS: 09/07/2024  Performed by: Eric LABOR Como, MD  Purpose: Diagnostic/Therapeutic Indications: Lumbar radicular pain severe enough to impact quality of life or function. 1. 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 Chronic radicular pain of lower extremity (Right)   3. Chronic lower extremity pain (Right)   4.  Foraminal stenosis of lumbar region  (Bilateral : L3-4, L4-5)   5. 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 DDD of lumbosacral region w/ LBP & LEP   7. Foraminal stenosis of lumbosacral region (L5-S1) (SEVERE  on Right)   8. 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 Low back pain of over 3 months duration   10. Low back pain potentially associated with radiculopathy   11. Low back pain radiating to leg (Right)   12. 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 Lumbosacral radiculopathy at L5 (Right)    NAS-11 Pain score:   Pre-procedure: 3 /10   Post-procedure: 0-No pain/10     Effectiveness:  Initial hour after procedure:   ***. Subsequent 4-6 hours post-procedure:   ***. Analgesia past initial 6 hours:   ***. Ongoing improvement:  Analgesic:  *** Function:    ***    ROM:    ***     Pharmacotherapy Assessment   Opioid Analgesic: None MME/day: 0 mg/day   Monitoring: [place] PMP: PDMP reviewed during this encounter.       Pharmacotherapy: No side-effects or adverse reactions reported. Compliance: No problems identified. Effectiveness: Clinically acceptable.  No notes on file  UDS:  Summary  Date Value Ref Range Status  06/23/2024 FINAL  Final    Comment:    ==================================================================== Compliance Drug Analysis, Ur ==================================================================== Test                             Result       Flag       Units  Drug Present and Declared for Prescription Verification   Gabapentin                      PRESENT      EXPECTED   Citalopram                      PRESENT      EXPECTED   Desmethylcitalopram            PRESENT      EXPECTED    Desmethylcitalopram is an expected metabolite of citalopram  or the    enantiomeric form, escitalopram.    Acetaminophen                   PRESENT      EXPECTED  Drug Absent but Declared for Prescription Verification   Salicylate                     Not Detected UNEXPECTED     Aspirin , as indicated in the declared medication list, is not always    detected even when used as directed.    Ibuprofen                      Not Detected UNEXPECTED    Ibuprofen, as indicated in the declared medication list, is not    always detected even when used as directed.  ==================================================================== Test                      Result    Flag   Units      Ref Range   Creatinine              129              mg/dL      >=  20 ==================================================================== Declared Medications:  The flagging and interpretation on this report are based on the  following declared medications.  Unexpected results may arise from  inaccuracies in the declared medications.   **Note: The testing scope of this panel includes these medications:   Citalopram  (Celexa )  Gabapentin  (Neurontin )   **Note: The testing scope of this panel does not include small to  moderate amounts of these reported medications:   Acetaminophen  (Tylenol )  Aspirin   Ibuprofen (Advil)   **Note: The testing scope of this panel does not include the  following reported medications:   Amlodipine  (Norvasc )  Benzonatate (Tessalon)  Insulin  (NovoLog )  Metformin (Glucophage)  Pantoprazole  (Protonix )  Potassium  Pravastatin  (Pravachol )  Semaglutide  Sitagliptin (Januvia)  Vitamin D3 ==================================================================== For clinical consultation, please call [phone]. ====================================================================     No results found for: CBDTHCR No results found for: D8THCCBX No results found for: D9THCCBX  ROS  Constitutional: Denies any fever or chills Gastrointestinal: No reported hemesis, hematochezia, vomiting, or acute GI distress Musculoskeletal: Denies any acute onset joint swelling, redness, loss of ROM, or weakness Neurological: No reported episodes of acute onset  apraxia, aphasia, dysarthria, agnosia, amnesia, paralysis, loss of coordination, or loss of consciousness  Medication Review  Semaglutide, Vitamin D3, acetaminophen , amLODipine , aspirin , benzonatate, citalopram , gabapentin , ibuprofen, metFORMIN, pantoprazole , and pravastatin   History Review  Allergy: Emma White has no known allergies. Drug: Emma White  reports no history of drug use. Alcohol:  reports no history of alcohol use. Tobacco:  reports that she has never smoked. She has never used smokeless tobacco. Social: Emma White  reports that she has never smoked. She has never used smokeless tobacco. She reports that she does not drink alcohol and does not use drugs. Medical:  has a past medical history of Anxiety, Arthritis, Complication of anesthesia (03/18/2022), Depression, Diabetes mellitus without complication (HCC), Dyspnea, Hyperlipemia, Hypertension, Neuropathy, and Sleep apnea. Surgical: Emma White  has a past surgical history that includes Knee arthrocentesis (Left, 1981); Knee arthrocentesis (Bilateral, 1992); HYSTERECTOMY SUPRACERVICAL ABDOMINAL W/REMOVAL TUBES &/OR OVARIES (1995); Abdominal hysterectomy (1995); Knee arthrocentesis (Bilateral); Colonoscopy with propofol  (N/A, 05/27/2016); Cataract extraction w/PHACO (Left, 09/25/2017); Eye surgery (Left, 09/25/2017); Cataract extraction w/PHACO (Right, 10/16/2017); Kenolog injection (Right, 10/16/2017); Esophagogastroduodenoscopy (egd) with propofol  (N/A, 07/10/2021); Colonoscopy with propofol  (N/A, 07/10/2021); Esophagogastroduodenoscopy (egd) with propofol  (N/A, 10/30/2021); Colonoscopy with propofol  (N/A, 10/30/2021); Colonoscopy with propofol  (N/A, 02/05/2022); Xi robotic laparoscopic assisted appendectomy (N/A, 03/18/2022); Colonoscopy with propofol  (N/A, 07/10/2023); polypectomy (07/10/2023); biopsy (07/10/2023); and Appendectomy. Family: family history is not on file.  Laboratory Chemistry Profile   Renal Lab Results  Component  Value Date   BUN 13 06/23/2024   CREATININE 0.75 06/23/2024   BCR 17 06/23/2024   GFRNONAA >60 03/19/2022    Hepatic Lab Results  Component Value Date   AST 18 06/23/2024   ALBUMIN 4.6 06/23/2024   ALKPHOS 73 06/23/2024    Electrolytes Lab Results  Component Value Date   NA 140 06/23/2024   K 4.3 06/23/2024   CL 101 06/23/2024   CALCIUM 9.8 06/23/2024   MG 1.6 06/23/2024    Bone Lab Results  Component Value Date   25OHVITD1 57 06/23/2024   25OHVITD2 <1.0 06/23/2024   25OHVITD3 56 06/23/2024    Inflammation (CRP: Acute Phase) (ESR: Chronic Phase) Lab Results  Component Value Date   CRP 1 06/23/2024   ESRSEDRATE 11 06/23/2024         Note: Above Lab results reviewed.  Recent Imaging Review  DG PAIN CLINIC C-ARM  1-60 MIN NO REPORT Fluoro was used, but no Radiologist interpretation will be provided.  Please refer to NOTES tab for provider progress note. Note: Reviewed        Physical Exam  Vitals: There were no vitals taken for this visit. BMI: Estimated body mass index is 28.12 kg/m as calculated from the following:   Height as of 09/07/24: 5' (1.524 m).   Weight as of 09/07/24: 144 lb (65.3 kg). Ideal: Patient weight not recorded General appearance: Well nourished, well developed, and well hydrated. In no apparent acute distress Mental status: Alert, oriented x 3 (person, place, & time)       Respiratory: No evidence of acute respiratory distress Eyes: PERLA   Assessment   Diagnosis Status  1. 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 Chronic lower extremity pain (Right)   3. Chronic radicular pain of lower extremity (Right)   4. Low back pain radiating to leg (Right)   5. Postop check    Controlled Controlled Controlled   Updated Problems: No problems updated.  Plan of Care  Problem-specific:  Assessment and Plan            Emma White has a current medication list which includes the following long-term  medication(s): amlodipine , citalopram , gabapentin , metformin, pantoprazole , and pravastatin .  Pharmacotherapy (Medications Ordered): No orders of the defined types were placed in this encounter.  Orders:  No orders of the defined types were placed in this encounter.    Interventional Therapies  Risk Factors  Considerations  Medical Comorbidities:  Class 2 diastolic-CHF  HTN  GERD  OSA  IDDM  Depression     Planned  Pending:   Therapeutic right L4-5 & L5-S1 TFESI #2    Under consideration:   Therapeutic right L4-5 & L5-S1 TFESI #2  Therapeutic right L4-5 LESI #1  Diagnostic/therapeutic right IA hip joint inj. #1  Diagnostic/therapeutic right IA knee inj. #1  Diagnostic bilateral lumbar facet MBB #1  Diagnostic lower extremity EMG/PNCV    Completed: (Analgesic benefit)1  Therapeutic right L4-5 & L5-S1 TFESI x1 (08/03/2024) (100/100/100 x 1 week/0)   Therapeutic  Palliative (PRN) options:   None established   Completed by other providers:   Oral steroid taper done.  Some improvement, but no complete resolution of symptoms.  Referral to physical therapy done.   1(Analgesic benefit): Expressed in percentage (%). (Local anesthetic[LA] +/- sedation  L.A.Local Anesthetic  Steroid benefit  Ongoing benefit)     No follow-ups on file.    Recent Visits Date Type Provider Dept  09/07/24 Procedure visit Tanya Glisson, MD [facility]  08/18/24 Office Visit Tanya Glisson, MD [facility]  08/03/24 Procedure visit Tanya Glisson, MD [facility]  07/12/24 Office Visit Tanya Glisson, MD [facility]  Showing recent visits within past 90 days and meeting all other requirements Future Appointments Date Type Provider Dept  09/27/24 Appointment Tanya Glisson, MD [facility]  Showing future appointments within next 90 days and meeting all other requirements  I discussed the assessment and treatment plan with  the patient. The patient was provided an opportunity to ask questions and all were answered. The patient agreed with the plan and demonstrated an understanding of the instructions.  Patient advised to call back or seek an in-person evaluation if the symptoms or condition worsens.  Duration of encounter: *** minutes.  Total time on encounter, as per AMA guidelines included both the face-to-face and non-face-to-face  time personally spent by the physician and/or other qualified health care professional(s) on the day of the encounter (includes time in activities that require the physician or other qualified health care professional and does not include time in activities normally performed by clinical staff). Physician's time may include the following activities when performed: Preparing to see the patient (e.g., pre-charting review of records, searching for previously ordered imaging, lab work, and nerve conduction tests) Review of prior analgesic pharmacotherapies. Reviewing PMP Interpreting ordered tests (e.g., lab work, imaging, nerve conduction tests) Performing post-procedure evaluations, including interpretation of diagnostic procedures Obtaining and/or reviewing separately obtained history Performing a medically appropriate examination and/or evaluation Counseling and educating the patient/family/caregiver Ordering medications, tests, or procedures Referring and communicating with other health care professionals (when not separately reported) Documenting clinical information in the electronic or other health record Independently interpreting results (not separately reported) and communicating results to the patient/ family/caregiver Care coordination (not separately reported)  Note by: Eric DELENA Como, MD (TTS and AI technology used. I apologize for any typographical errors that were not detected and corrected.) Date: 09/27/2024; Time: 7:25 AM

## 2024-09-27 ENCOUNTER — Ambulatory Visit: Attending: Pain Medicine | Admitting: Pain Medicine

## 2024-09-27 ENCOUNTER — Encounter: Payer: Self-pay | Admitting: Pain Medicine

## 2024-09-27 VITALS — BP 139/66 | HR 86 | Temp 98.1°F | Resp 16 | Ht 60.0 in | Wt 144.0 lb

## 2024-09-27 DIAGNOSIS — Z09 Encounter for follow-up examination after completed treatment for conditions other than malignant neoplasm: Secondary | ICD-10-CM | POA: Insufficient documentation

## 2024-09-27 DIAGNOSIS — G8929 Other chronic pain: Secondary | ICD-10-CM | POA: Diagnosis present

## 2024-09-27 DIAGNOSIS — M5441 Lumbago with sciatica, right side: Secondary | ICD-10-CM | POA: Diagnosis present

## 2024-09-27 DIAGNOSIS — M79604 Pain in right leg: Secondary | ICD-10-CM | POA: Insufficient documentation

## 2024-09-27 DIAGNOSIS — M5410 Radiculopathy, site unspecified: Secondary | ICD-10-CM | POA: Diagnosis present

## 2024-09-27 DIAGNOSIS — M545 Low back pain, unspecified: Secondary | ICD-10-CM | POA: Insufficient documentation

## 2024-09-27 NOTE — Progress Notes (Signed)
 Safety precautions to be maintained throughout the outpatient stay will include: orient to surroundings, keep bed in low position, maintain call bell within reach at all times, provide assistance with transfer out of bed and ambulation.

## 2024-09-29 ENCOUNTER — Encounter: Payer: Self-pay | Admitting: *Deleted

## 2024-10-11 ENCOUNTER — Ambulatory Visit: Admission: RE | Admit: 2024-10-11 | Source: Home / Self Care

## 2024-10-11 SURGERY — COLONOSCOPY
Anesthesia: General

## 2024-10-19 NOTE — Patient Instructions (Signed)
 ______________________________________________________________________    Procedure instructions  Stop blood-thinners  Do not eat or drink fluids (other than water ) for 6 hours before your procedure  No water  for 2 hours before your procedure  Take your blood pressure medicine with a sip of water   Arrive 30 minutes before your appointment  If sedation is planned, bring suitable driver. [place], [place], & public transportation are NOT APPROVED)  Carefully read the Preparing for your procedure detailed instructions  If you have questions call us  at [phone]  Procedure appointments are for procedures only.   NO medication refills or new problem evaluations will be done on procedure days.   Only the scheduled, pre-approved procedure and side will be done.   ______________________________________________________________________     ______________________________________________________________________    Preparing for your procedure  Appointments: If you think you may not be able to keep your appointment, call 24-48 hours in advance to cancel. We need time to make it available to others.  Procedure visits are for procedures only. During your procedure appointment there will be: NO Prescription Refills*. NO medication changes or discussions*. NO discussion of disability issues*. NO unrelated pain problem evaluations*. NO evaluations to order other pain procedures*. *These will be addressed at a separate and distinct evaluation encounter on the provider's evaluation schedule and not during procedure days.  Instructions: Food intake: Avoid eating anything solid for at least 8 hours prior to your procedure. Clear liquid intake: You may take clear liquids such as water  up to 2 hours prior to your procedure. (No carbonated drinks. No soda.) Transportation: Unless otherwise stated by your physician, bring a driver. (Driver cannot be a Market researcher, Pharmacist, community, or any other form of public  transportation.) Morning Medicines: Except for blood thinners, take all of your other morning medications with a sip of water . Make sure to take your heart and blood pressure medicines. If your blood pressure's lower number is above 100, the case will be rescheduled. Blood thinners: Make sure to stop your blood thinners as instructed.  If you take a blood thinner, but were not instructed to stop it, call our office [phone] and ask to talk to a nurse. Not stopping a blood thinner prior to certain procedures could lead to serious complications. Diabetics on insulin : Notify the staff so that you can be scheduled 1st case in the morning. If your diabetes requires high dose insulin , take only  of your normal insulin  dose the morning of the procedure and notify the staff that you have done so. Preventing infections: Shower with an antibacterial soap the morning of your procedure.  Build-up your immune system: Take 1000 mg of Vitamin C with every meal (3 times a day) the day prior to your procedure. Antibiotics: Inform the nursing staff if you are taking any antibiotics or if you have any conditions that may require antibiotics prior to procedures. (Example: recent joint implants)   Pregnancy: If you are pregnant make sure to notify the nursing staff. Not doing so may result in injury to the fetus, including death.  Sickness: If you have a cold, fever, or any active infections, call and cancel or reschedule your procedure. Receiving steroids while having an infection may result in complications. Arrival: You must be in the facility at least 30 minutes prior to your scheduled procedure. Tardiness: Your scheduled time is also the cutoff time. If you do not arrive at least 15 minutes prior to your procedure, you will be rescheduled.  Children: Do not bring any children with  you. Make arrangements to keep them home. Dress appropriately: There is always a possibility that your clothing may get soiled. Avoid  long dresses. Valuables: Do not bring any jewelry or valuables.  Reasons to call and reschedule or cancel your procedure: (Following these recommendations will minimize the risk of a serious complication.) Surgeries: Avoid having procedures within 2 weeks of any surgery. (Avoid for 2 weeks before or after any surgery). Flu Shots: Avoid having procedures within 2 weeks of a flu shots or . (Avoid for 2 weeks before or after immunizations). Barium: Avoid having a procedure within 7-10 days after having had a radiological study involving the use of radiological contrast. (Myelograms, Barium swallow or enema study). Heart attacks: Avoid any elective procedures or surgeries for the initial 6 months after a Myocardial Infarction (Heart Attack). Blood thinners: It is imperative that you stop these medications before procedures. Let us  know if you if you take any blood thinner.  Infection: Avoid procedures during or within two weeks of an infection (including chest colds or gastrointestinal problems). Symptoms associated with infections include: Localized redness, fever, chills, night sweats or profuse sweating, burning sensation when voiding, cough, congestion, stuffiness, runny nose, sore throat, diarrhea, nausea, vomiting, cold or Flu symptoms, recent or current infections. It is specially important if the infection is over the area that we intend to treat. Heart and lung problems: Symptoms that may suggest an active cardiopulmonary problem include: cough, chest pain, breathing difficulties or shortness of breath, dizziness, ankle swelling, uncontrolled high or unusually low blood pressure, and/or palpitations. If you are experiencing any of these symptoms, cancel your procedure and contact your primary care physician for an evaluation.  Remember:  Regular Business hours are:  Monday to Thursday 8:00 AM to 4:00 PM  Provider's Schedule: Eric Como, MD:  Procedure days: Tuesday and Thursday 7:30  AM to 4:00 PM  Wallie Sherry, MD:  Procedure days: Monday and Wednesday 7:30 AM to 4:00 PM Last  Updated: 11/25/2023 ______________________________________________________________________     ______________________________________________________________________    General Risks and Possible Complications  Patient Responsibilities: It is important that you read this as it is part of your informed consent. It is our duty to inform you of the risks and possible complications associated with treatments offered to you. It is your responsibility as a patient to read this and to ask questions about anything that is not clear or that you believe was not covered in this document.  Patient's Rights: You have the right to refuse treatment. You also have the right to change your mind, even after initially having agreed to have the treatment done. However, under this last option, if you wait until the last second to change your mind, you may be charged for the materials used up to that point.  Introduction: Medicine is not an Visual merchandiser. Everything in Medicine, including the lack of treatment(s), carries the potential for danger, harm, or loss (which is by definition: Risk). In Medicine, a complication is a secondary problem, condition, or disease that can aggravate an already existing one. All treatments carry the risk of possible complications. The fact that a side effects or complications occurs, does not imply that the treatment was conducted incorrectly. It must be clearly understood that these can happen even when everything is done following the highest safety standards.  No treatment: You can choose not to proceed with the proposed treatment alternative. The "PRO(s)" would include: avoiding the risk of complications associated with the therapy. The "CON(s)" would include:  not getting any of the treatment benefits. These benefits fall under one of three categories: diagnostic; therapeutic; and/or  palliative. Diagnostic benefits include: getting information which can ultimately lead to improvement of the disease or symptom(s). Therapeutic benefits are those associated with the successful treatment of the disease. Finally, palliative benefits are those related to the decrease of the primary symptoms, without necessarily curing the condition (example: decreasing the pain from a flare-up of a chronic condition, such as incurable terminal cancer).  General Risks and Complications: These are associated to most interventional treatments. They can occur alone, or in combination. They fall under one of the following six (6) categories: no benefit or worsening of symptoms; bleeding; infection; nerve damage; allergic reactions; and/or death. No benefits or worsening of symptoms: In Medicine there are no guarantees, only probabilities. No healthcare provider can ever guarantee that a medical treatment will work, they can only state the probability that it may. Furthermore, there is always the possibility that the condition may worsen, either directly, or indirectly, as a consequence of the treatment. Bleeding: This is more common if the patient is taking a blood thinner, either prescription or over the counter (example: Goody Powders, Fish oil, Aspirin, Garlic, etc.), or if suffering a condition associated with impaired coagulation (example: Hemophilia, cirrhosis of the liver, low platelet counts, etc.). However, even if you do not have one on these, it can still happen. If you have any of these conditions, or take one of these drugs, make sure to notify your treating physician. Infection: This is more common in patients with a compromised immune system, either due to disease (example: diabetes, cancer, human immunodeficiency virus [HIV], etc.), or due to medications or treatments (example: therapies used to treat cancer and rheumatological diseases). However, even if you do not have one on these, it can still  happen. If you have any of these conditions, or take one of these drugs, make sure to notify your treating physician. Nerve Damage: This is more common when the treatment is an invasive one, but it can also happen with the use of medications, such as those used in the treatment of cancer. The damage can occur to small secondary nerves, or to large primary ones, such as those in the spinal cord and brain. This damage may be temporary or permanent and it may lead to impairments that can range from temporary numbness to permanent paralysis and/or brain death. Allergic Reactions: Any time a substance or material comes in contact with our body, there is the possibility of an allergic reaction. These can range from a mild skin rash (contact dermatitis) to a severe systemic reaction (anaphylactic reaction), which can result in death. Death: In general, any medical intervention can result in death, most of the time due to an unforeseen complication. ______________________________________________________________________      ______________________________________________________________________    Steroid injections  Common steroids for injections Triamcinolone: Used by many sports medicine physicians for large joint and bursal injections, often combined with a local anesthetic like lidocaine . A study focusing on coccydynia (tailbone pain) found triamcinolone was more effective than betamethasone , suggesting it may also be preferable for other localized inflammation conditions. Methylprednisolone: A common alternative to triamcinolone that is also a strong anti-inflammatory. It is available in different formulations, with the acetate suspension being the long-acting option for intra-articular injections. Dexamethasone : This is a non-particulate steroid, meaning it has a lower risk of tissue damage compared to particulate steroids like triamcinolone and methylprednisolone. While less common for this specific  use,  it is an option for targeted injections.   Considerations for physicians Particulate vs. non-particulate steroids: Triamcinolone and methylprednisolone are particulate, meaning they can clump together. Dexamethasone  is non-particulate. Particulate steroids are often preferred for their longer-lasting effects but carry a theoretical higher risk for certain injections (though this is less of a concern in the costochondral joints). Combined injectate: Corticosteroids are typically mixed with a local anesthetic like lidocaine  to provide both immediate pain relief (from the anesthetic) and longer-term inflammation reduction (from the steroid). Imaging guidance: To ensure accurate placement of the needle and medication, physicians may use ultrasound or fluoroscopic guidance for the injection, especially in complex or refractory cases.   Patient guidance Before undergoing a steroid injection, discuss the options with your physician. They will determine the best steroid, dosage, and procedure for your specific case based on factors like: Severity of your condition History of response to other treatments Your overall health status Experience and preference of the physician  Last  Updated: 08/10/2024 ______________________________________________________________________

## 2024-10-19 NOTE — Progress Notes (Unsigned)
 PROVIDER NOTE: Interpretation of information contained herein should be left to medically-trained personnel. Specific patient instructions are provided elsewhere under Patient Instructions section of medical record. This document was created in part using AI and STT-dictation technology, any transcriptional errors that may result from this process are unintentional.  Patient: Emma White  Service: E/M   PCP: Autry Grayce LABOR, PA  DOB: 07/21/1956  DOS: 10/20/2024  Provider: Eric LABOR Como, MD  MRN: [id number]  Delivery: Face-to-face  Specialty: Interventional Pain Management  Type: Established Patient  Setting: Ambulatory outpatient facility  Specialty designation: 09  Referring Prov.: Autry Grayce LABOR, PA  Location: Outpatient office facility       History of present illness (HPI) Emma White, a 68 y.o. year old female, is here today because of her Chronic bilateral low back pain with right-sided sciatica [G89.29, M54.41]. Emma White primary complain today is No chief complaint on file.  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); Shoulder pain (Right); Peripheral neuropathy; Type 2 diabetes mellitus with diabetic polyneuropathy, with long-term current use of insulin  (HCC); Chronic knee pain (Bilateral) (R>L); Chronic hip pain (Right); Impaired range of motion of hip (Right); Chronic lower extremity pain (Right); Impaired range of motion of knee (Right); Abnormal MRI, lumbar spine [facility]) (03/05/2024); Lumbosacral radiculopathy at L5 (Right); Chronic radicular pain of lower extremity (Right); 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, L4-5); Foraminal stenosis of lumbosacral region (L5-S1) (SEVERE  on Right); 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); Lumbar facet arthropathy (Bilateral: L2-3, L3-4, L4-5, L5-S1); Lumbar facet joint pain  (Bilateral); Low back pain of over 3 months duration; Low back pain potentially associated with radiculopathy; Low back pain radiating to leg (Right); Multifactorial low back pain; 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  Pain Assessment: Severity of   is reported as a  /10. Location:    / . Onset:  . Quality:  . Timing:  . Modifying factor(s):  SABRA Vitals:  vitals were not taken for this visit.  BMI: Estimated body mass index is 28.12 kg/m as calculated from the following:   Height as of 09/27/24: 5' (1.524 m).   Weight as of 09/27/24: 144 lb (65.3 kg).  Last encounter: 09/27/2024. Last procedure: 09/07/2024.  Reason for encounter: evaluation of worsening, or previously known (established) problem.   Discussed the use of AI scribe software for clinical note transcription with the patient, who gave verbal consent to proceed.  History of Present Illness           Pharmacotherapy Assessment   Opioid Analgesic: None MME/day: 0 mg/day   Monitoring: [place] PMP: PDMP not reviewed this encounter.       Pharmacotherapy: No side-effects or adverse reactions reported. Compliance: No problems identified. Effectiveness: Clinically acceptable.  No notes on file  UDS:  Summary  Date Value Ref Range Status  06/23/2024 FINAL  Final    Comment:    ==================================================================== Compliance Drug Analysis, Ur ==================================================================== Test                             Result  Flag       Units  Drug Present and Declared for Prescription Verification   Gabapentin                       PRESENT      EXPECTED   Citalopram                      PRESENT      EXPECTED   Desmethylcitalopram            PRESENT      EXPECTED    Desmethylcitalopram is an expected metabolite of citalopram  or the    enantiomeric form, escitalopram.    Acetaminophen                   PRESENT      EXPECTED  Drug Absent but Declared for Prescription Verification   Salicylate                     Not Detected UNEXPECTED    Aspirin , as indicated in the declared medication list, is not always    detected even when used as directed.    Ibuprofen                      Not Detected UNEXPECTED    Ibuprofen, as indicated in the declared medication list, is not    always detected even when used as directed.  ==================================================================== Test                      Result    Flag   Units      Ref Range   Creatinine              129              mg/dL      >=79 ==================================================================== Declared Medications:  The flagging and interpretation on this report are based on the  following declared medications.  Unexpected results may arise from  inaccuracies in the declared medications.   **Note: The testing scope of this panel includes these medications:   Citalopram  (Celexa )  Gabapentin  (Neurontin )   **Note: The testing scope of this panel does not include small to  moderate amounts of these reported medications:   Acetaminophen  (Tylenol )  Aspirin   Ibuprofen (Advil)   **Note: The testing scope of this panel does not include the  following reported medications:   Amlodipine  (Norvasc )  Benzonatate (Tessalon)  Insulin  (NovoLog )  Metformin (Glucophage)  Pantoprazole  (Protonix )  Potassium  Pravastatin  (Pravachol )  Semaglutide  Sitagliptin (Januvia)  Vitamin D3 ==================================================================== For clinical consultation, please call [phone]. ====================================================================     No results found for: CBDTHCR No results found for: D8THCCBX No results found for: D9THCCBX  ROS  Constitutional: Denies any fever or chills Gastrointestinal: No reported hemesis, hematochezia, vomiting, or acute GI distress Musculoskeletal: Denies any acute onset joint swelling, redness, loss of ROM, or weakness Neurological: No reported episodes of acute onset apraxia, aphasia, dysarthria, agnosia, amnesia, paralysis, loss of coordination, or loss of consciousness  Medication Review  Semaglutide, Vitamin D3, acetaminophen , amLODipine , aspirin , benzonatate, citalopram , gabapentin , ibuprofen, metFORMIN, pantoprazole , and pravastatin   History Review  Allergy: Emma White has no known allergies. Drug: Emma White  reports no history of drug use. Alcohol:  reports no history of alcohol use. Tobacco:  reports that she has never smoked. She has never used smokeless tobacco. Social: Emma White  reports that she has never smoked. She has never used smokeless tobacco. She reports that she does not drink alcohol and does not use drugs. Medical:  has a past medical history of Anxiety, Arthritis, Complication of anesthesia (03/18/2022), Depression, Diabetes mellitus without complication (HCC), Dyspnea, Hyperlipemia, Hypertension, Neuropathy, and Sleep apnea. Surgical: Emma White  has a past surgical history that includes Knee arthrocentesis (Left, 1981); Knee arthrocentesis (Bilateral, 1992); HYSTERECTOMY SUPRACERVICAL ABDOMINAL W/REMOVAL TUBES &/OR OVARIES (1995); Abdominal hysterectomy (1995); Knee arthrocentesis (Bilateral); Colonoscopy with propofol  (N/A, 05/27/2016); Cataract extraction w/PHACO (Left, 09/25/2017); Eye surgery (Left, 09/25/2017); Cataract extraction w/PHACO (Right, 10/16/2017); Kenolog injection (Right, 10/16/2017); Esophagogastroduodenoscopy (egd) with propofol  (N/A, 07/10/2021); Colonoscopy with  propofol  (N/A, 07/10/2021); Esophagogastroduodenoscopy (egd) with propofol  (N/A, 10/30/2021); Colonoscopy with propofol  (N/A, 10/30/2021); Colonoscopy with propofol  (N/A, 02/05/2022); Xi robotic laparoscopic assisted appendectomy (N/A, 03/18/2022); Colonoscopy with propofol  (N/A, 07/10/2023); polypectomy (07/10/2023); biopsy (07/10/2023); and Appendectomy. Family: family history is not on file.  Laboratory Chemistry Profile   Renal Lab Results  Component Value Date   BUN 13 06/23/2024   CREATININE 0.75 06/23/2024   BCR 17 06/23/2024   GFRNONAA >60 03/19/2022    Hepatic Lab Results  Component Value Date   AST 18 06/23/2024   ALBUMIN 4.6 06/23/2024   ALKPHOS 73 06/23/2024    Electrolytes Lab Results  Component Value Date   NA 140 06/23/2024   K 4.3 06/23/2024   CL 101 06/23/2024   CALCIUM 9.8 06/23/2024   MG 1.6 06/23/2024    Bone Lab Results  Component Value Date   25OHVITD1 57 06/23/2024   25OHVITD2 <1.0 06/23/2024   25OHVITD3 56 06/23/2024    Inflammation (CRP: Acute Phase) (ESR: Chronic Phase) Lab Results  Component Value Date   CRP 1 06/23/2024   ESRSEDRATE 11 06/23/2024         Note: Above Lab results reviewed.  Recent Imaging Review  DG PAIN CLINIC C-ARM 1-60 MIN NO REPORT Fluoro was used, but no Radiologist interpretation will be provided.  Please refer to NOTES tab for provider progress note. Note: Reviewed        Physical Exam  Vitals: There were no vitals taken for this visit. BMI: Estimated body mass index is 28.12 kg/m as calculated from the following:   Height as of 09/27/24: 5' (1.524 m).   Weight as of 09/27/24: 144 lb (65.3 kg). Ideal: Patient weight not recorded General appearance: Well nourished, well developed, and well hydrated. In no apparent acute distress Mental status: Alert, oriented x 3 (person, place, & time)       Respiratory: No evidence of acute respiratory distress Eyes: PERLA   Assessment   Diagnosis Status  1. Chronic  low back pain (Bilateral) (R>L) w/ sciatica (Right)   2. Chronic radicular pain of lower extremity (Right)   3. Chronic lower extremity pain (Right)   4. Low back pain of over 3 months duration   5. Low back pain potentially associated with radiculopathy   6. Low back pain radiating to leg (Right)    Controlled Controlled Controlled   Updated Problems: No problems updated.  Plan of Care  Problem-specific:  Assessment and Plan            Emma White has a current medication list which includes the following long-term medication(s): amlodipine , citalopram , gabapentin , metformin, pantoprazole , and pravastatin .  Pharmacotherapy (Medications Ordered): No orders of the defined types were placed in this encounter.  Orders:  No orders of the defined types were placed in this  encounter.    Interventional Therapies  Risk Factors  Considerations  Medical Comorbidities:  Class 2 diastolic-CHF  HTN  GERD  OSA  IDDM  Depression     Planned  Pending:      Under consideration:   Therapeutic right L4-5 & L5-S1 TFESI #3  Therapeutic right L4-5 LESI #1  Diagnostic/therapeutic right IA hip joint inj. #1  Diagnostic/therapeutic right IA knee inj. #1  Diagnostic bilateral lumbar facet MBB #1  Diagnostic lower extremity EMG/PNCV    Completed: (Analgesic benefit)1  Therapeutic right L4-5 & L5-S1 TFESI x2 (08/03/2024) (100/100/100/100)   Therapeutic  Palliative (PRN) options:   None established   Completed by other providers:   Oral steroid taper done.  Some improvement, but no complete resolution of symptoms.  Referral to physical therapy done.   1(Analgesic benefit): Expressed in percentage (%). (Local anesthetic[LA] +/- sedation  L.A.Local Anesthetic  Steroid benefit  Ongoing benefit)     No follow-ups on file.    Recent Visits Date Type Provider Dept  09/27/24 Office Visit Tanya Glisson, MD [facility]  09/07/24 Procedure visit Tanya Glisson, MD [facility]  08/18/24 Office Visit Tanya Glisson, MD [facility]  08/03/24 Procedure visit Tanya Glisson, MD [facility]  Showing recent visits within past 90 days and meeting all other requirements Future Appointments Date Type Provider Dept  10/20/24 Appointment Tanya Glisson, MD [facility]  Showing future appointments within next 90 days and meeting all other requirements  I discussed the assessment and treatment plan with the patient. The patient was provided an opportunity to ask questions and all were answered. The patient agreed with the plan and demonstrated an understanding of the instructions.  Patient advised to call back or seek an in-person evaluation if the symptoms or condition worsens.  Duration of encounter: *** minutes.  Total time on encounter, as per AMA guidelines included both the face-to-face and non-face-to-face time personally spent by the physician and/or other qualified health care professional(s) on the day of the encounter (includes time in activities that require the physician or other qualified health care professional and does not include time in activities normally performed by clinical staff). Physician's time may include the following activities when performed: Preparing to see the patient (e.g., pre-charting review of records, searching for previously ordered imaging, lab work, and nerve conduction tests) Review of prior analgesic pharmacotherapies. Reviewing PMP Interpreting ordered tests (e.g., lab work, imaging, nerve conduction tests) Performing post-procedure evaluations, including interpretation of diagnostic procedures Obtaining and/or reviewing separately obtained history Performing a medically appropriate examination and/or evaluation Counseling and educating the patient/family/caregiver Ordering medications, tests, or procedures Referring and communicating with other health care  professionals (when not separately reported) Documenting clinical information in the electronic or other health record Independently interpreting results (not separately reported) and communicating results to the patient/ family/caregiver Care coordination (not separately reported)  Note by: Glisson DELENA Tanya, MD (TTS and AI technology used. I apologize for any typographical errors that were not detected and corrected.) Date: 10/20/2024; Time: 9:47 PM

## 2024-10-20 ENCOUNTER — Ambulatory Visit: Attending: Pain Medicine | Admitting: Pain Medicine

## 2024-10-20 ENCOUNTER — Encounter: Payer: Self-pay | Admitting: Pain Medicine

## 2024-10-20 VITALS — BP 131/64 | HR 84 | Temp 98.1°F | Resp 16 | Ht 60.0 in | Wt 144.0 lb

## 2024-10-20 DIAGNOSIS — M545 Low back pain, unspecified: Secondary | ICD-10-CM | POA: Insufficient documentation

## 2024-10-20 DIAGNOSIS — M5441 Lumbago with sciatica, right side: Secondary | ICD-10-CM | POA: Insufficient documentation

## 2024-10-20 DIAGNOSIS — M5410 Radiculopathy, site unspecified: Secondary | ICD-10-CM | POA: Diagnosis present

## 2024-10-20 DIAGNOSIS — M79604 Pain in right leg: Secondary | ICD-10-CM | POA: Diagnosis not present

## 2024-10-20 DIAGNOSIS — G8929 Other chronic pain: Secondary | ICD-10-CM | POA: Diagnosis present

## 2024-10-20 NOTE — Progress Notes (Signed)
 Safety precautions to be maintained throughout the outpatient stay will include: orient to surroundings, keep bed in low position, maintain call bell within reach at all times, provide assistance with transfer out of bed and ambulation.

## 2024-10-28 ENCOUNTER — Ambulatory Visit: Admitting: Pain Medicine

## 2024-11-01 ENCOUNTER — Telehealth: Payer: Self-pay

## 2024-11-01 NOTE — Telephone Encounter (Signed)
 Insurance Treatment Denial Note  Date order was entered:  Order entered by: Eric Como, MD,Seema Tobie, NP Requested treatment: TFESI Reason for denial: see below Recommended for approval: wait another month   Her insurance will only approve this if she got 50% relief for 3 months. I will try again to get the auth after December 21 which will be 3 months since her last one.

## 2024-11-08 ENCOUNTER — Ambulatory Visit: Admitting: Anesthesiology

## 2024-11-08 ENCOUNTER — Ambulatory Visit
Admission: RE | Admit: 2024-11-08 | Discharge: 2024-11-08 | Disposition: A | Discharge status: Home / Self Care | Attending: Gastroenterology | Admitting: Gastroenterology

## 2024-11-08 ENCOUNTER — Encounter
Admission: RE | Disposition: A | Discharge status: Home / Self Care | Payer: Self-pay | Source: Home / Self Care | Attending: Gastroenterology

## 2024-11-08 ENCOUNTER — Encounter: Payer: Self-pay | Admitting: Gastroenterology

## 2024-11-08 DIAGNOSIS — G4733 Obstructive sleep apnea (adult) (pediatric): Secondary | ICD-10-CM | POA: Insufficient documentation

## 2024-11-08 DIAGNOSIS — Z7984 Long term (current) use of oral hypoglycemic drugs: Secondary | ICD-10-CM | POA: Diagnosis not present

## 2024-11-08 DIAGNOSIS — Z7985 Long-term (current) use of injectable non-insulin antidiabetic drugs: Secondary | ICD-10-CM | POA: Diagnosis not present

## 2024-11-08 DIAGNOSIS — K2289 Other specified disease of esophagus: Secondary | ICD-10-CM | POA: Diagnosis not present

## 2024-11-08 DIAGNOSIS — K219 Gastro-esophageal reflux disease without esophagitis: Secondary | ICD-10-CM | POA: Diagnosis not present

## 2024-11-08 DIAGNOSIS — I509 Heart failure, unspecified: Secondary | ICD-10-CM | POA: Insufficient documentation

## 2024-11-08 DIAGNOSIS — K640 First degree hemorrhoids: Secondary | ICD-10-CM | POA: Insufficient documentation

## 2024-11-08 DIAGNOSIS — F419 Anxiety disorder, unspecified: Secondary | ICD-10-CM | POA: Diagnosis not present

## 2024-11-08 DIAGNOSIS — E1040 Type 1 diabetes mellitus with diabetic neuropathy, unspecified: Secondary | ICD-10-CM | POA: Diagnosis not present

## 2024-11-08 DIAGNOSIS — I110 Hypertensive heart disease with heart failure: Secondary | ICD-10-CM | POA: Diagnosis not present

## 2024-11-08 DIAGNOSIS — K5730 Diverticulosis of large intestine without perforation or abscess without bleeding: Secondary | ICD-10-CM | POA: Insufficient documentation

## 2024-11-08 DIAGNOSIS — Z860101 Personal history of adenomatous and serrated colon polyps: Secondary | ICD-10-CM | POA: Diagnosis present

## 2024-11-08 DIAGNOSIS — K2270 Barrett's esophagus without dysplasia: Secondary | ICD-10-CM | POA: Diagnosis present

## 2024-11-08 DIAGNOSIS — Z1211 Encounter for screening for malignant neoplasm of colon: Secondary | ICD-10-CM | POA: Diagnosis not present

## 2024-11-08 HISTORY — PX: COLONOSCOPY: SHX5424

## 2024-11-08 HISTORY — PX: ESOPHAGOGASTRODUODENOSCOPY: SHX5428

## 2024-11-08 LAB — GLUCOSE, CAPILLARY: Glucose-Capillary: 144 mg/dL — ABNORMAL HIGH (ref 70–99)

## 2024-11-08 SURGERY — COLONOSCOPY
Anesthesia: General

## 2024-11-08 MED ORDER — LIDOCAINE HCL (PF) 2 % IJ SOLN
INTRAMUSCULAR | Status: AC
Start: 2024-11-08 — End: 2024-11-08
  Filled 2024-11-08: qty 5

## 2024-11-08 MED ORDER — PROPOFOL 500 MG/50ML IV EMUL
INTRAVENOUS | Status: DC | PRN
  Administered 2024-11-08: 75 ug/kg/min via INTRAVENOUS

## 2024-11-08 MED ORDER — SODIUM CHLORIDE 0.9 % IV SOLN: INTRAVENOUS | Status: DC

## 2024-11-08 MED ORDER — GLYCOPYRROLATE 0.2 MG/ML IJ SOLN
INTRAMUSCULAR | Status: AC
  Filled 2024-11-08: qty 1

## 2024-11-08 MED ORDER — PROPOFOL 10 MG/ML IV BOLUS
INTRAVENOUS | Status: DC | PRN
  Administered 2024-11-08 (×2): 50 mg via INTRAVENOUS

## 2024-11-08 MED ORDER — DEXMEDETOMIDINE HCL IN NACL 80 MCG/20ML IV SOLN
INTRAVENOUS | Status: DC | PRN
Start: 2024-11-08 — End: 2024-11-08
  Administered 2024-11-08: 12 ug via INTRAVENOUS
  Administered 2024-11-08: 8 ug via INTRAVENOUS

## 2024-11-08 MED ORDER — LIDOCAINE HCL (CARDIAC) PF 100 MG/5ML IV SOSY
PREFILLED_SYRINGE | INTRAVENOUS | Status: DC | PRN
  Administered 2024-11-08: 60 mg via INTRAVENOUS

## 2024-11-08 NOTE — Anesthesia Postprocedure Evaluation (Signed)
 Anesthesia Post Note  Patient: Emma White  Procedure(s) Performed: COLONOSCOPY EGD (ESOPHAGOGASTRODUODENOSCOPY)  Patient location during evaluation: PACU Anesthesia Type: General Level of consciousness: awake Pain management: pain level controlled Vital Signs Assessment: post-procedure vital signs reviewed and stable Respiratory status: spontaneous breathing Cardiovascular status: stable Anesthetic complications: no   No notable events documented.   Last Vitals:  Vitals:   11/08/24 0904 11/08/24 0914  BP: (!) 98/52 99/67  Pulse: 83 77  Resp: 15 (!) 21  Temp:    SpO2: 95% 95%    Last Pain:  Vitals:   11/08/24 0914  TempSrc:   PainSc: 0-No pain                 VAN STAVEREN,Inella Kuwahara

## 2024-11-08 NOTE — Op Note (Signed)
 [facility] Patient Name: Emma White Procedure Date: 11/08/2024 8:22 AM MRN: [id number] Account #: [id number] Date of Birth: 1956-07-21 Admit Type: Outpatient Age: 68 Room: [facility] ENDO ROOM 3 Gender: Female Note Status: Finalized Instrument Name: Endoscope [id number] Procedure:             Upper GI endoscopy Indications:           Follow-up of Barrett's esophagus Providers:             Ole Schick MD, MD Referring MD:          Tye DELENA Flakes, PA Medicines:             Monitored Anesthesia Care Complications:         No immediate complications. Procedure:             Pre-Anesthesia Assessment:                        - Prior to the procedure, a History and Physical was                         performed, and patient medications and allergies were                         reviewed. The patient is competent. The risks and                         benefits of the procedure and the sedation options and                         risks were discussed with the patient. All questions                         were answered and informed consent was obtained.                         Patient identification and proposed procedure were                         verified by the physician, the nurse, the                         anesthesiologist, the anesthetist and the technician                         in the endoscopy suite. Mental Status Examination:                         alert and oriented. Airway Examination: normal                         oropharyngeal airway and neck mobility. Respiratory                         Examination: clear to auscultation. CV Examination:                         normal. Prophylactic Antibiotics: The patient does not  require prophylactic antibiotics. Prior                         Anticoagulants: The patient has taken no anticoagulant                         or antiplatelet agents. ASA Grade Assessment: III - A                          patient with severe systemic disease. After reviewing                         the risks and benefits, the patient was deemed in                         satisfactory condition to undergo the procedure. The                         anesthesia plan was to use monitored anesthesia care                         (MAC). Immediately prior to administration of                         medications, the patient was re-assessed for adequacy                         to receive sedatives. The heart rate, respiratory                         rate, oxygen saturations, blood pressure, adequacy of                         pulmonary ventilation, and response to care were                         monitored throughout the procedure. The physical                         status of the patient was re-assessed after the                         procedure.                        After obtaining informed consent, the endoscope was                         passed under direct vision. Throughout the procedure,                         the patient's blood pressure, pulse, and oxygen                         saturations were monitored continuously. The Endoscope                         was introduced through the mouth, and advanced to the  second part of duodenum. The upper GI endoscopy was                         accomplished without difficulty. The patient tolerated                         the procedure well. Findings:      The Z-line was irregular.      The exam of the esophagus was otherwise normal.      The entire examined stomach was normal.      The examined duodenum was normal. Impression:            - Z-line irregular.                        - Normal stomach.                        - Normal examined duodenum.                        - No specimens collected. Recommendation:        - Discharge patient to home.                        - Resume previous diet.                         - Continue present medications.                        - Return to referring physician as previously                         scheduled. Procedure Code(s):     --- Professional ---                        [phone], Esophagogastroduodenoscopy, flexible,                         transoral; diagnostic, including collection of                         specimen(s) by brushing or washing, when performed                         (separate procedure) Diagnosis Code(s):     --- Professional ---                        K22.89, Other specified disease of esophagus                        K22.70, Barrett's esophagus without dysplasia CPT copyright 2022 [employer]. All rights reserved. The codes documented in this report are preliminary and upon coder review may  be revised to meet current compliance requirements. Ole Schick MD, MD 11/08/2024 8:59:17 AM Number of Addenda: 0 Note Initiated On: 11/08/2024 8:22 AM Estimated Blood Loss:  Estimated blood loss: none.      [facility]

## 2024-11-08 NOTE — Op Note (Signed)
 [facility] Patient Name: Emma White Procedure Date: 11/08/2024 8:21 AM MRN: [id number] Account #: [id number] Date of Birth: 01/18/1956 Admit Type: Outpatient Age: 68 Room: [facility] ENDO ROOM 3 Gender: Female Note Status: Finalized Instrument Name: Colon Scope [id number] Procedure:             Colonoscopy Indications:           High risk colon cancer surveillance: Personal history                         of colonic polyps, Last colonoscopy 1 year ago Providers:             Ole Schick MD, MD Referring MD:          Tye DELENA Flakes, PA Medicines:             Monitored Anesthesia Care Complications:         No immediate complications. Procedure:             Pre-Anesthesia Assessment:                        - Prior to the procedure, a History and Physical was                         performed, and patient medications and allergies were                         reviewed. The patient is competent. The risks and                         benefits of the procedure and the sedation options and                         risks were discussed with the patient. All questions                         were answered and informed consent was obtained.                         Patient identification and proposed procedure were                         verified by the physician, the nurse, the                         anesthesiologist, the anesthetist and the technician                         in the endoscopy suite. Mental Status Examination:                         alert and oriented. Airway Examination: normal                         oropharyngeal airway and neck mobility. Respiratory                         Examination: clear to auscultation. CV Examination:  normal. Prophylactic Antibiotics: The patient does not                         require prophylactic antibiotics. Prior                         Anticoagulants: The patient has taken no  anticoagulant                         or antiplatelet agents. ASA Grade Assessment: III - A                         patient with severe systemic disease. After reviewing                         the risks and benefits, the patient was deemed in                         satisfactory condition to undergo the procedure. The                         anesthesia plan was to use monitored anesthesia care                         (MAC). Immediately prior to administration of                         medications, the patient was re-assessed for adequacy                         to receive sedatives. The heart rate, respiratory                         rate, oxygen saturations, blood pressure, adequacy of                         pulmonary ventilation, and response to care were                         monitored throughout the procedure. The physical                         status of the patient was re-assessed after the                         procedure.                        After obtaining informed consent, the colonoscope was                         passed under direct vision. Throughout the procedure,                         the patient's blood pressure, pulse, and oxygen                         saturations were monitored continuously. The  Colonoscope was introduced through the anus and                         advanced to the the terminal ileum, with                         identification of the appendiceal orifice and IC                         valve. The colonoscopy was performed without                         difficulty. The patient tolerated the procedure well.                         The quality of the bowel preparation was good. The                         terminal ileum, ileocecal valve, appendiceal orifice,                         and rectum were photographed. Findings:      The perianal and digital rectal examinations were normal.      The terminal ileum appeared  normal.      Multiple large-mouthed and small-mouthed diverticula were found in the       sigmoid colon.      Internal hemorrhoids were found during retroflexion. The hemorrhoids       were Grade I (internal hemorrhoids that do not prolapse).      The exam was otherwise without abnormality on direct and retroflexion       views. Impression:            - The examined portion of the ileum was normal.                        - Diverticulosis in the sigmoid colon.                        - Internal hemorrhoids.                        - The examination was otherwise normal on direct and                         retroflexion views.                        - No specimens collected. Recommendation:        - Discharge patient to home.                        - Resume previous diet.                        - Continue present medications.                        - Repeat colonoscopy in 5 years for surveillance if  benefits outweigh risks.                        - Return to referring physician as previously                         scheduled. Procedure Code(s):     --- Professional ---                        H9894, Colorectal cancer screening; colonoscopy on                         individual at high risk Diagnosis Code(s):     --- Professional ---                        Z86.010, Personal history of colonic polyps                        K64.0, First degree hemorrhoids                        K57.30, Diverticulosis of large intestine without                         perforation or abscess without bleeding CPT copyright 2022 [employer]. All rights reserved. The codes documented in this report are preliminary and upon coder review may  be revised to meet current compliance requirements. Ole Schick MD, MD 11/08/2024 9:02:30 AM Number of Addenda: 0 Note Initiated On: 11/08/2024 8:21 AM Scope Withdrawal Time: 0 hours 6 minutes 54 seconds  Total Procedure  Duration: 0 hours 12 minutes 11 seconds  Estimated Blood Loss:  Estimated blood loss: none.      [facility]

## 2024-11-08 NOTE — Transfer of Care (Signed)
 Immediate Anesthesia Transfer of Care Note  Patient: Emma White  Procedure(s) Performed: COLONOSCOPY EGD (ESOPHAGOGASTRODUODENOSCOPY)  Patient Location: PACU  Anesthesia Type:General  Level of Consciousness: sedated  Airway & Oxygen Therapy: Patient Spontanous Breathing  Post-op Assessment: Report given to RN and Post -op Vital signs reviewed and stable  Post vital signs: Reviewed and stable  Last Vitals:  Vitals Value Taken Time  BP 91/50 11/08/24 08:55  Temp    Pulse 74 11/08/24 08:55  Resp 20 11/08/24 08:55  SpO2 96 % 11/08/24 08:55  Vitals shown include unfiled device data.  Last Pain:  Vitals:   11/08/24 0746  TempSrc: Temporal  PainSc: 0-No pain         Complications: No notable events documented.

## 2024-11-08 NOTE — OR Nursing (Signed)
 Pt claims front tooth is chipped and was not like that before her case.  She had an EGD and colonoscopy.  I explained that is a risk with an upper scope/bite block.  Pt with poor dentition

## 2024-11-08 NOTE — Interval H&P Note (Signed)
 History and Physical Interval Note:  11/08/2024 8:28 AM  Emma White  has presented today for surgery, with the diagnosis of History of colon polyps (Z86.0100) Barrett's esophagus without dysplasia (K22.70).  The various methods of treatment have been discussed with the patient and family. After consideration of risks, benefits and other options for treatment, the patient has consented to  Procedure(s): COLONOSCOPY (N/A) EGD (ESOPHAGOGASTRODUODENOSCOPY) (N/A) as a surgical intervention.  The patient's history has been reviewed, patient examined, no change in status, stable for surgery.  I have reviewed the patient's chart and labs.  Questions were answered to the patient's satisfaction.     Ole [name]  Ok to proceed with EGD/Colonoscopy

## 2024-11-08 NOTE — H&P (Signed)
 Outpatient short stay form Pre-procedure 11/08/2024  Emma [name], MD  Primary Physician: Autry Grayce LABOR, PA  Reason for visit:  Surveillance/BE  History of present illness:    68 y/o lady with history of hypertension, OSA, and sleep apnea here for EGD for BE and colonoscopy for history of adenomatous polyp on last colon about one year ago. No blood thinners. History of appendectomy.    Current Facility-Administered Medications:    0.9 %  sodium chloride  infusion, , Intravenous, Continuous, Priscilla Kirstein, Emma ONEIDA, MD, Last Rate: 20 mL/hr at 11/08/24 0800, Continued from Pre-op at 11/08/24 0800  Medications Prior to Admission  Medication Sig Dispense Refill Last Dose/Taking   amLODipine  (NORVASC ) 10 MG tablet Take 10 mg by mouth daily.   11/08/2024 Morning   acetaminophen  (TYLENOL ) 650 MG CR tablet Take 650 mg by mouth 2 (two) times daily.      aspirin  81 MG chewable tablet Chew 81 mg by mouth daily.   11/06/2024   benzonatate (TESSALON) 100 MG capsule Take 100 mg by mouth 3 (three) times daily as needed for cough.      Cholecalciferol (VITAMIN D3) 125 MCG (5000 UT) CAPS Take by mouth.      citalopram  (CELEXA ) 20 MG tablet Take 20 mg by mouth daily. With dinner   10/30/2024   gabapentin  (NEURONTIN ) 800 MG tablet Take 800 mg by mouth 3 (three) times daily.   11/06/2024   ibuprofen (ADVIL,MOTRIN) 200 MG tablet Take 400 mg by mouth every 6 (six) hours as needed for moderate pain.      metFORMIN (GLUCOPHAGE) 500 MG tablet Take 500 mg by mouth 3 (three) times daily.   11/06/2024   pantoprazole  (PROTONIX ) 40 MG tablet Take 40 mg by mouth daily.   11/06/2024   pravastatin  (PRAVACHOL ) 10 MG tablet Take 10 mg by mouth daily.   11/06/2024   Semaglutide (OZEMPIC, 0.25 OR 0.5 MG/DOSE, [place]) Inject into the skin.   10/30/2024     No Known Allergies   Past Medical History:  Diagnosis Date   Anxiety    Arthritis    hand   Complication of anesthesia 03/18/2022   Hypoxia in pacu. See pacu  note 03/18/2022. The hospitalist was consulted for further work-up.   Depression    Diabetes mellitus without complication (HCC)    Dyspnea    DOE   Hyperlipemia    Hypertension    Neuropathy    Sleep apnea     Review of systems:  Otherwise negative.    Physical Exam  Gen: Alert, oriented. Appears stated age.  HEENT: PERRLA. Lungs: No respiratory distress CV: RRR Abd: soft, benign, no masses Ext: No edema    Planned procedures: Proceed with EGD/colonoscopy. The patient understands the nature of the planned procedure, indications, risks, alternatives and potential complications including but not limited to bleeding, infection, perforation, damage to internal organs and possible oversedation/side effects from anesthesia. The patient agrees and gives consent to proceed.  Please refer to procedure notes for findings, recommendations and patient disposition/instructions.     Emma [name], MD [facility]

## 2024-11-08 NOTE — Anesthesia Preprocedure Evaluation (Signed)
 Anesthesia Evaluation  Patient identified by MRN, date of birth, ID band Patient awake    Reviewed: Allergy & Precautions, NPO status , Patient's Chart, lab work & pertinent test results  Airway Mallampati: III  TM Distance: >3 FB Neck ROM: full    Dental  (+) Partial Upper   Pulmonary neg pulmonary ROS, shortness of breath, sleep apnea    Pulmonary exam normal breath sounds clear to auscultation       Cardiovascular Exercise Tolerance: Good hypertension, Pt. on medications +CHF  negative cardio ROS Normal cardiovascular exam Rhythm:Regular Rate:Normal     Neuro/Psych   Anxiety     negative neurological ROS  negative psych ROS   GI/Hepatic negative GI ROS, Neg liver ROS,GERD  Medicated,,  Endo/Other  negative endocrine ROSdiabetes, Well Controlled, Type 1    Renal/GU negative Renal ROS  negative genitourinary   Musculoskeletal   Abdominal   Peds negative pediatric ROS (+)  Hematology negative hematology ROS (+) Blood dyscrasia, anemia   Anesthesia Other Findings Past Medical History: No date: Anxiety No date: Arthritis     Comment:  hand 03/18/2022: Complication of anesthesia     Comment:  Hypoxia in pacu. See pacu note 03/18/2022. The hospitalist              was consulted for further work-up. No date: Depression No date: Diabetes mellitus without complication (HCC) No date: Dyspnea     Comment:  DOE No date: Hyperlipemia No date: Hypertension No date: Neuropathy No date: Sleep apnea  Past Surgical History: 1995: ABDOMINAL HYSTERECTOMY No date: APPENDECTOMY 07/10/2023: BIOPSY     Comment:  Procedure: BIOPSY;  Surgeon: Onita Elspeth Sharper, DO;               Location: [facility] ENDOSCOPY;  Service: Gastroenterology;; 09/25/2017: CATARACT EXTRACTION W/PHACO; Left     Comment:  Procedure: CATARACT EXTRACTION PHACO AND INTRAOCULAR               LENS PLACEMENT (IOC);  Surgeon: Myrna Adine Anes, MD;                 Location: [facility] ORS;  Service: Ophthalmology;  Laterality:              Left;   flui pack lot # [id number] H  US  00:32.7 AP%                 12.17 CDE   3.95 10/16/2017: CATARACT EXTRACTION W/PHACO; Right     Comment:  Procedure: CATARACT EXTRACTION PHACO AND INTRAOCULAR               LENS PLACEMENT (IOC);  Surgeon: Myrna Adine Anes, MD;                Location: [facility] ORS;  Service: Ophthalmology;  Laterality:              Right;  US  00:32.8 AP% 9.0 CDE 2.96 FLUID PACK LOT #               [id number] 05/27/2016: COLONOSCOPY WITH PROPOFOL ; N/A     Comment:  Procedure: COLONOSCOPY WITH PROPOFOL ;  Surgeon: Lamar ONEIDA Holmes, MD;  Location: [facility] ENDOSCOPY;  Service:               Endoscopy;  Laterality: N/A; 07/10/2021: COLONOSCOPY WITH PROPOFOL ; N/A     Comment:  Procedure: COLONOSCOPY WITH PROPOFOL ;  Surgeon:  Maryruth Ole DASEN, MD;  Location: [facility] ENDOSCOPY;                Service: Endoscopy;  Laterality: N/A; 10/30/2021: COLONOSCOPY WITH PROPOFOL ; N/A     Comment:  Procedure: COLONOSCOPY WITH PROPOFOL ;  Surgeon:               Maryruth Ole DASEN, MD;  Location: [facility] ENDOSCOPY;                Service: Endoscopy;  Laterality: N/A; 02/05/2022: COLONOSCOPY WITH PROPOFOL ; N/A     Comment:  Procedure: COLONOSCOPY WITH PROPOFOL ;  Surgeon:               Maryruth Ole DASEN, MD;  Location: [facility] ENDOSCOPY;                Service: Endoscopy;  Laterality: N/A; 07/10/2023: COLONOSCOPY WITH PROPOFOL ; N/A     Comment:  Procedure: COLONOSCOPY WITH PROPOFOL ;  Surgeon: Onita Elspeth Sharper, DO;  Location: [facility] ENDOSCOPY;  Service:               Gastroenterology;  Laterality: N/A; 07/10/2021: ESOPHAGOGASTRODUODENOSCOPY (EGD) WITH PROPOFOL ; N/A     Comment:  Procedure: ESOPHAGOGASTRODUODENOSCOPY (EGD) WITH               PROPOFOL ;  Surgeon: Maryruth Ole DASEN, MD;  Location:               [facility] ENDOSCOPY;  Service: Endoscopy;  Laterality: N/A;                 IDDM 10/30/2021: ESOPHAGOGASTRODUODENOSCOPY (EGD) WITH PROPOFOL ; N/A     Comment:  Procedure: ESOPHAGOGASTRODUODENOSCOPY (EGD) WITH               PROPOFOL ;  Surgeon: Maryruth Ole DASEN, MD;  Location:               [facility] ENDOSCOPY;  Service: Endoscopy;  Laterality: N/A;                IDDM 09/25/2017: EYE SURGERY; Left     Comment:  cataract extraction 1995: HYSTERECTOMY SUPRACERVICAL ABDOMINAL W/REMOVAL TUBES &/OR  OVARIES 10/16/2017: KENALOG  INJECTION; Right     Comment:  Procedure: KENALOG  INJECTION;  Surgeon: Myrna Adine Anes, MD;  Location: [facility] ORS;  Service: Ophthalmology;                Laterality: Right; 1981: KNEE ARTHROCENTESIS; Left 1992: KNEE ARTHROCENTESIS; Bilateral     Comment:  right one in 1992 No date: KNEE ARTHROCENTESIS; Bilateral 07/10/2023: POLYPECTOMY     Comment:  Procedure: POLYPECTOMY;  Surgeon: Onita Elspeth Sharper,              DO;  Location: [facility] ENDOSCOPY;  Service:               Gastroenterology;; 03/18/2022: XI ROBOTIC LAPAROSCOPIC ASSISTED APPENDECTOMY; N/A     Comment:  Procedure: XI ROBOTIC LAPAROSCOPIC ASSISTED               APPENDECTOMY;  Surgeon: Rodolph Romano, MD;                Location: [facility] ORS;  Service: General;  Laterality: N/A;  BMI    Body Mass Index: 26.17 kg/m      Reproductive/Obstetrics negative OB ROS  Anesthesia Physical Anesthesia Plan  ASA: 3  Anesthesia Plan: General   Post-op Pain Management:    Induction: Intravenous  PONV Risk Score and Plan: Propofol  infusion and TIVA  Airway Management Planned: Natural Airway and Nasal Cannula  Additional Equipment:   Intra-op Plan:   Post-operative Plan:   Informed Consent: I have reviewed the patients History and Physical, chart, labs and discussed the procedure including the risks, benefits and alternatives for the proposed anesthesia with the patient or authorized representative who  has indicated his/her understanding and acceptance.     Dental Advisory Given  Plan Discussed with: CRNA  Anesthesia Plan Comments:         Anesthesia Quick Evaluation

## 2025-01-13 ENCOUNTER — Encounter: Payer: Self-pay | Admitting: Pain Medicine

## 2025-01-13 ENCOUNTER — Ambulatory Visit (HOSPITAL_BASED_OUTPATIENT_CLINIC_OR_DEPARTMENT_OTHER): Admitting: Pain Medicine

## 2025-01-13 ENCOUNTER — Ambulatory Visit
Admission: RE | Admit: 2025-01-13 | Discharge: 2025-01-13 | Disposition: A | Discharge status: Home / Self Care | Source: Ambulatory Visit | Attending: Pain Medicine | Admitting: Pain Medicine

## 2025-01-13 VITALS — BP 133/65 | HR 75 | Temp 98.2°F | Resp 16 | Ht 60.0 in | Wt 133.0 lb

## 2025-01-13 DIAGNOSIS — M5410 Radiculopathy, site unspecified: Secondary | ICD-10-CM | POA: Diagnosis present

## 2025-01-13 DIAGNOSIS — M5417 Radiculopathy, lumbosacral region: Secondary | ICD-10-CM

## 2025-01-13 DIAGNOSIS — G8929 Other chronic pain: Secondary | ICD-10-CM | POA: Insufficient documentation

## 2025-01-13 DIAGNOSIS — M48061 Spinal stenosis, lumbar region without neurogenic claudication: Secondary | ICD-10-CM | POA: Diagnosis present

## 2025-01-13 DIAGNOSIS — M79604 Pain in right leg: Secondary | ICD-10-CM | POA: Insufficient documentation

## 2025-01-13 DIAGNOSIS — R937 Abnormal findings on diagnostic imaging of other parts of musculoskeletal system: Secondary | ICD-10-CM

## 2025-01-13 DIAGNOSIS — M545 Low back pain, unspecified: Secondary | ICD-10-CM | POA: Insufficient documentation

## 2025-01-13 DIAGNOSIS — M5441 Lumbago with sciatica, right side: Secondary | ICD-10-CM | POA: Insufficient documentation

## 2025-01-13 DIAGNOSIS — M5126 Other intervertebral disc displacement, lumbar region: Secondary | ICD-10-CM

## 2025-01-13 MED ORDER — ROPIVACAINE HCL 2 MG/ML IJ SOLN
2.0000 mL | Freq: Once | INTRAMUSCULAR | Status: AC
  Administered 2025-01-13: 2 mL via EPIDURAL
  Filled 2025-01-13: qty 20

## 2025-01-13 MED ORDER — DEXAMETHASONE SOD PHOSPHATE PF 10 MG/ML IJ SOLN
20.0000 mg | Freq: Once | INTRAMUSCULAR | Status: AC
  Administered 2025-01-13: 20 mg
  Filled 2025-01-13: qty 2

## 2025-01-13 MED ORDER — LIDOCAINE HCL 2 % IJ SOLN
20.0000 mL | Freq: Once | INTRAMUSCULAR | Status: AC
  Administered 2025-01-13: 400 mg
  Filled 2025-01-13: qty 40

## 2025-01-13 MED ORDER — PENTAFLUOROPROP-TETRAFLUOROETH EX AERO
INHALATION_SPRAY | Freq: Once | CUTANEOUS | Status: AC
  Administered 2025-01-13: 30 via TOPICAL

## 2025-01-13 MED ORDER — IOHEXOL 180 MG/ML  SOLN
10.0000 mL | Freq: Once | INTRAMUSCULAR | Status: AC
  Administered 2025-01-13: 10 mL via EPIDURAL
  Filled 2025-01-13: qty 20

## 2025-01-13 MED ORDER — MIDAZOLAM HCL (PF) 2 MG/2ML IJ SOLN
0.5000 mg | Freq: Once | INTRAMUSCULAR | Status: AC
  Administered 2025-01-13: 2 mg via INTRAVENOUS
  Filled 2025-01-13: qty 2

## 2025-01-13 MED ORDER — FENTANYL CITRATE (PF) 100 MCG/2ML IJ SOLN
25.0000 ug | INTRAMUSCULAR | Status: DC | PRN
  Filled 2025-01-13: qty 2

## 2025-01-13 MED ORDER — SODIUM CHLORIDE 0.9% FLUSH
2.0000 mL | Freq: Once | INTRAVENOUS | Status: AC
  Administered 2025-01-13: 2 mL

## 2025-01-13 NOTE — Progress Notes (Addendum)
 PROVIDER NOTE: Interpretation of information contained herein should be left to medically-trained personnel. Specific patient instructions are provided elsewhere under Patient Instructions section of medical record. This document was created in part using STT-dictation technology, any transcriptional errors that may result from this process are unintentional.  Patient: Emma White Type: Established DOB: 1956/10/09 MRN: [id number] PCP: Autry Grayce LABOR, PA  Service: Procedure DOS: 01/13/2025 Setting: Ambulatory Location: Ambulatory outpatient facility Delivery: Face-to-face Provider: Eric LABOR Como, MD Specialty: Interventional Pain Management Specialty designation: 09 Location: Outpatient facility Ref. Prov.: Como Eric, MD       Interventional Therapy   Procedure: Lumbar trans-foraminal epidural steroid injection (L-TFESI) #3  Laterality: Right (-RT)  Level: L3 & L4 nerve root(s) Imaging: Fluoroscopy-guided Spinal ([id number]) Anesthesia: Local anesthesia (1-2% Lidocaine ) Anxiolysis: IV Versed   2.0 mg           Analgesia: No Sedation None required. No Fentanyl  administered.         DOS: 01/13/2025  Performed by: Eric LABOR Como, MD  Purpose: Diagnostic/Therapeutic Indications: Lumbar radicular pain severe enough to impact quality of life or function. 1. 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 Chronic lower extremity pain (Right)   3. 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 Lumbosacral radiculopathy at L5 (Right)   7. Abnormal MRI, lumbar spine [facility]) (03/05/2024)    NAS-11 Pain score:   Pre-procedure: 5 /10   Post-procedure: 0-No pain/10     Position / Prep / Materials:  Position: Prone  Prep solution: ChloraPrep (2% chlorhexidine  gluconate and 70% isopropyl alcohol) Prep Area: Entire Posterior  Lumbosacral Area.  From the lower tip of the scapula down to the tailbone and from flank to flank. Materials:  Tray: Block Needle(s):  Type: Spinal  Gauge (G): 22  Length: 5-in  Qty: 2     H&P (Pre-op Assessment):  Emma White is a 69 y.o. (year old), female patient, seen today for interventional treatment. She  has a past surgical history that includes Knee arthrocentesis (Left, 1981); Knee arthrocentesis (Bilateral, 1992); HYSTERECTOMY SUPRACERVICAL ABDOMINAL W/REMOVAL TUBES &/OR OVARIES (1995); Abdominal hysterectomy (1995); Knee arthrocentesis (Bilateral); Colonoscopy with propofol  (N/A, 05/27/2016); Cataract extraction w/PHACO (Left, 09/25/2017); Eye surgery (Left, 09/25/2017); Cataract extraction w/PHACO (Right, 10/16/2017); Kenolog injection (Right, 10/16/2017); Esophagogastroduodenoscopy (egd) with propofol  (N/A, 07/10/2021); Colonoscopy with propofol  (N/A, 07/10/2021); Esophagogastroduodenoscopy (egd) with propofol  (N/A, 10/30/2021); Colonoscopy with propofol  (N/A, 10/30/2021); Colonoscopy with propofol  (N/A, 02/05/2022); Xi robotic laparoscopic assisted appendectomy (N/A, 03/18/2022); Colonoscopy with propofol  (N/A, 07/10/2023); polypectomy (07/10/2023); biopsy (07/10/2023); Appendectomy; Colonoscopy (N/A, 11/08/2024); and Esophagogastroduodenoscopy (N/A, 11/08/2024). Emma White has a current medication list which includes the following prescription(s): acetaminophen , amlodipine , aspirin , benzonatate, vitamin d3, citalopram , gabapentin , ibuprofen, lantus solostar, metformin, pantoprazole , pravastatin , and semaglutide, and the following Facility-Administered Medications: fentanyl . Her primarily concern today is the Back Pain (lower)  Initial Vital Signs:  Pulse/HCG Rate: 87ECG Heart Rate: 81 Temp: 98.2 F (36.8 C) Resp: 16 BP: 128/64 SpO2: 98 %  BMI: Estimated body mass index is 25.97 kg/m as calculated from the following:   Height as of this encounter: 5' (1.524 m).   Weight as of this  encounter: 133 lb (60.3 kg).  Risk Assessment: Allergies: Reviewed. She has no known allergies.  Allergy Precautions: None required Coagulopathies: Reviewed. None identified.  Blood-thinner therapy: None at this time Active Infection(s): Reviewed. None identified. Ms.  White is afebrile  Site Confirmation: Emma White was asked to confirm the procedure and laterality before marking the site Procedure checklist: Completed Consent: Before the procedure and under the influence of no sedative(s), amnesic(s), or anxiolytics, the patient was informed of the treatment options, risks and possible complications. To fulfill our ethical and legal obligations, as recommended by the American Medical Association's Code of Ethics, I have informed the patient of my clinical impression; the nature and purpose of the treatment or procedure; the risks, benefits, and possible complications of the intervention; the alternatives, including doing nothing; the risk(s) and benefit(s) of the alternative treatment(s) or procedure(s); and the risk(s) and benefit(s) of doing nothing. The patient was provided information about the general risks and possible complications associated with the procedure. These may include, but are not limited to: failure to achieve desired goals, infection, bleeding, organ or nerve damage, allergic reactions, paralysis, and death. In addition, the patient was informed of those risks and complications associated to Spine-related procedures, such as failure to decrease pain; infection (i.e.: Meningitis, epidural or intraspinal abscess); bleeding (i.e.: epidural hematoma, subarachnoid hemorrhage, or any other type of intraspinal or peri-dural bleeding); organ or nerve damage (i.e.: Any type of peripheral nerve, nerve root, or spinal cord injury) with subsequent damage to sensory, motor, and/or autonomic systems, resulting in permanent pain, numbness, and/or weakness of one or several areas of the body;  allergic reactions; (i.e.: anaphylactic reaction); and/or death. Furthermore, the patient was informed of those risks and complications associated with the medications. These include, but are not limited to: allergic reactions (i.e.: anaphylactic or anaphylactoid reaction(s)); adrenal axis suppression; blood sugar elevation that in diabetics may result in ketoacidosis or comma; water  retention that in patients with history of congestive heart failure may result in shortness of breath, pulmonary edema, and decompensation with resultant heart failure; weight gain; swelling or edema; medication-induced neural toxicity; particulate matter embolism and blood vessel occlusion with resultant organ, and/or nervous system infarction; and/or aseptic necrosis of one or more joints. Finally, the patient was informed that Medicine is not an exact science; therefore, there is also the possibility of unforeseen or unpredictable risks and/or possible complications that may result in a catastrophic outcome. The patient indicated having understood very clearly. We have given the patient no guarantees and we have made no promises. Enough time was given to the patient to ask questions, all of which were answered to the patient's satisfaction. Ms. Formanek has indicated that she wanted to continue with the procedure. Attestation: I, the ordering provider, attest that I have discussed with the patient the benefits, risks, side-effects, alternatives, likelihood of achieving goals, and potential problems during recovery for the procedure that I have provided informed consent. Date  Time: 01/13/2025  8:11 AM  Pre-Procedure Preparation:  Monitoring: As per clinic protocol. Respiration, ETCO2, SpO2, BP, heart rate and rhythm monitor placed and checked for adequate function Safety Precautions: Patient was assessed for positional comfort and pressure points before starting the procedure. Time-out: I initiated and conducted the Time-out  before starting the procedure, as per protocol. The patient was asked to participate by confirming the accuracy of the Time Out information. Verification of the correct person, site, and procedure were performed and confirmed by me, the nursing staff, and the patient. Time-out conducted as per Joint Commission's Universal Protocol (UP.01.01.01). Time: 0828 Start Time: 0828 hrs.  Description/Narrative of Procedure:          Target: The 6 o'clock position under the pedicle, on the affected  side. Region: Posterolateral Lumbosacral Approach: Posterior Percutaneous Paravertebral approach.  Rationale (medical necessity): procedure needed and proper for the diagnosis and/or treatment of the patient's medical symptoms and needs. Procedural Technique Safety Precautions: Aspiration looking for blood return was conducted prior to all injections. At no point did we inject any substances, as a needle was being advanced. No attempts were made at seeking any paresthesias. Safe injection practices and needle disposal techniques used. Medications properly checked for expiration dates. SDV (single dose vial) medications used. Description of the Procedure: Protocol guidelines were followed. The patient was placed in position over the procedure table. The target area was identified and the area prepped in the usual manner. Skin & deeper tissues infiltrated with local anesthetic. Appropriate amount of time allowed to pass for local anesthetics to take effect. The procedure needles were then advanced to the target area. Proper needle placement secured. Negative aspiration confirmed. Solution injected in intermittent fashion, asking for systemic symptoms every 0.5cc of injectate. The needles were then removed and the area cleansed, making sure to leave some of the prepping solution back to take advantage of its long term bactericidal properties.  Vitals:   01/13/25 0827 01/13/25 0833 01/13/25 0835 01/13/25 0842  BP:  129/72 (!) 141/70 135/78 133/65  Pulse:    75  Resp: 18 18 16 16   Temp:      TempSrc:      SpO2: 96% 98% 94% 98%  Weight:      Height:        Start Time: 0828 hrs. End Time: 0835 hrs.  Imaging Guidance (Spinal):          Type of Imaging Technique: Fluoroscopy Guidance (Spinal) Indication(s): Fluoroscopy guidance for needle placement to enhance accuracy in procedures requiring precise needle localization for targeted delivery of medication in or near specific anatomical locations not easily accessible without such real-time imaging assistance. Exposure Time: Please see nurses notes. Contrast: Before injecting any contrast, we confirmed that the patient did not have an allergy to iodine , shellfish, or radiological contrast. Once satisfactory needle placement was completed at the desired level, radiological contrast was injected. Contrast injected under live fluoroscopy. No contrast complications. See chart for type and volume of contrast used. Fluoroscopic Guidance: I was personally present during the use of fluoroscopy. Tunnel Vision Technique used to obtain the best possible view of the target area. Parallax error corrected before commencing the procedure. Direction-depth-direction technique used to introduce the needle under continuous pulsed fluoroscopy. Once target was reached, antero-posterior, oblique, and lateral fluoroscopic projection used confirm needle placement in all planes. Images permanently stored in EMR. Interpretation: I personally interpreted the imaging intraoperatively. Adequate needle placement confirmed in multiple planes. Appropriate spread of contrast into desired area was observed. No evidence of afferent or efferent intravascular uptake. No intrathecal or subarachnoid spread observed. Permanent images saved into the patient's record.  Post-operative Assessment:  Post-procedure Vital Signs:  Pulse/HCG Rate: 7578 Temp: 98.2 F (36.8 C) Resp: 16 BP: 133/65 SpO2:  98 %  EBL: None  Complications: No immediate post-treatment complications observed by team, or reported by patient.  Note: The patient tolerated the entire procedure well. A repeat set of vitals were taken after the procedure and the patient was kept under observation following institutional policy, for this type of procedure. Post-procedural neurological assessment was performed, showing return to baseline, prior to discharge. The patient was provided with post-procedure discharge instructions, including a section on how to identify potential problems. Should any problems arise concerning this procedure,  the patient was given instructions to immediately contact us , at any time, without hesitation. In any case, we plan to contact the patient by telephone for a follow-up status report regarding this interventional procedure.  Comments:  No additional relevant information.  Plan of Care (POC)  Orders:  Orders Placed This Encounter  Procedures   Lumbar Transforaminal Epidural    Scheduling Instructions:     Laterality: Right-sided     Level(s): L3 & L4     Procedural Analgesia/Anxiolysis: Patient's choice     Date: 01/13/2025    Where will this procedure be performed?:   [facility]   [facility] C-ARM 1-60 MIN NO REPORT    Intraoperative interpretation by procedural physician at [facility].    Standing Status:   Standing    Number of Occurrences:   1    Reason for exam::   Assistance in needle guidance and placement for procedures requiring needle placement in or near specific anatomical locations not easily accessible without such assistance.   Informed Consent Details: Physician/Practitioner Attestation; Transcribe to consent form and obtain patient signature    Provider Attestation: I, Nabiha Planck A. Tanya, MD, (Pain Management Specialist), the physician/practitioner, attest that I have discussed with the patient the benefits, risks, side effects, alternatives,  likelihood of achieving goals and potential problems during recovery for the procedure that I have provided informed consent.    Scheduling Instructions:     Note: Always confirm laterality of pain with Ms. Kievit, before procedure.     Transcribe to consent form and obtain patient signature.    Physician/Practitioner attestation of informed consent for procedure/surgical case:   I, the physician/practitioner, attest that I have discussed with the patient the benefits, risks, side effects, alternatives, likelihood of achieving goals and potential problems during recovery for the procedure that I have provided informed consent.    Procedure:   Diagnostic lumbar transforaminal epidural steroid injection under fluoroscopic guidance. (See notes for level and laterality.)    Physician/Practitioner performing the procedure:   Jenine Krisher A. Heriberto Stmartin, MD    Indication/Reason:   Lumbar radiculopathy/radiculitis associated with lumbar stenosis   Provide equipment / supplies at bedside    Procedure tray: Block Tray (Disposable  single use) Skin infiltration needle: Regular 1.5-in, 25-G, (x1) Block Needle type: Spinal Amount/quantity: 2 Size: Medium (5-inch) Gauge: 22G    Standing Status:   Standing    Number of Occurrences:   1    Specify:   Block Tray   Saline lock IV    Have LR [phone] mL available and administer at 125 mL/hr if patient becomes hypotensive.    Standing Status:   Standing    Number of Occurrences:   1     Opioid Analgesic: None MME/day: 0 mg/day    Medications ordered for procedure: Meds ordered this encounter  Medications   iohexol  (OMNIPAQUE ) 180 MG/ML injection 10 mL    Must be Myelogram-compatible. If not available, you may substitute with a water -soluble, non-ionic, hypoallergenic, myelogram-compatible radiological contrast medium.   lidocaine  (XYLOCAINE ) 2 % (with pres) injection 400 mg   pentafluoroprop-tetrafluoroeth (GEBAUERS) aerosol   midazolam  PF (VERSED )  injection 0.5-2 mg    Make sure Flumazenil is available in the pyxis when using this medication. If oversedation occurs, administer 0.2 mg IV over 15 sec. If after 45 sec no response, administer 0.2 mg again over 1 min; may repeat at 1 min intervals; not to exceed 4 doses (1 mg)   fentaNYL  (SUBLIMAZE )  injection 25-50 mcg    Make sure Narcan is available in the pyxis when using this medication. In the event of respiratory depression (RR< 8/min): Titrate NARCAN (naloxone) in increments of 0.1 to 0.2 mg IV at 2-3 minute intervals, until desired degree of reversal.   sodium chloride  flush (NS) 0.9 % injection 2 mL    This is for a two (2) level block. Use two (2) syringes and divide content in half.   ropivacaine  (PF) 2 mg/mL (0.2%) (NAROPIN ) injection 2 mL    This is for a two (2) level block. Use two (2) syringes and divide content in half.   dexamethasone  (DECADRON ) injection 20 mg    This is for a two (2) level block. Use two (2) syringes and divide content in half.   Medications administered: We administered iohexol , lidocaine , pentafluoroprop-tetrafluoroeth, midazolam  PF, sodium chloride  flush, ropivacaine  (PF) 2 mg/mL (0.2%), and dexamethasone .  See the medical record for exact dosing, route, and time of administration.    Interventional Therapies  Risk Factors  Considerations  Medical Comorbidities:  Class 2 diastolic-CHF  HTN  GERD  OSA  IDDM  Depression     Planned  Pending:   Therapeutic right L4 and L5 TFESI #3    Under consideration:   Therapeutic right L4-5 & L5-S1 TFESI #3  Therapeutic right L4-5 LESI #1  Diagnostic/therapeutic right IA hip joint inj. #1  Diagnostic/therapeutic right IA knee inj. #1  Diagnostic bilateral lumbar facet MBB #1  Diagnostic lower extremity EMG/PNCV    Completed: (Analgesic benefit)1  Therapeutic right L4-5 & L5-S1 TFESI x2 (08/03/2024) (100/100/100/100)   Therapeutic  Palliative (PRN) options:   None established   Completed by  other providers:   Oral steroid taper done.  Some improvement, but no complete resolution of symptoms.  Referral to physical therapy done.   1(Analgesic benefit): Expressed in percentage (%). (Local anesthetic[LA] +/- sedation  L.A.Local Anesthetic  Steroid benefit  Ongoing benefit)      Follow-up plan:   Return in about 2 weeks (around 01/27/2025) for (Face2F), (PPE).     Recent Visits Date Type Provider Dept  10/20/24 Office Visit Tanya Glisson, MD [facility]  Showing recent visits within past 90 days and meeting all other requirements Today's Visits Date Type Provider Dept  01/13/25 Procedure visit Tanya Glisson, MD [facility]  Showing today's visits and meeting all other requirements Future Appointments Date Type Provider Dept  01/31/25 Appointment Tanya Glisson, MD [facility]  Showing future appointments within next 90 days and meeting all other requirements   Disposition: Discharge home  Discharge (Date  Time): 01/13/2025; 0848 hrs.   Primary Care Physician: Autry Grayce LABOR, PA Location: [facility] Note by: Glisson LABOR Tanya, MD (TTS technology used. I apologize for any typographical errors that were not detected and corrected.) Date: 01/13/2025; Time: 9:23 AM  Disclaimer:  Medicine is not an visual merchandiser. The only guarantee in medicine is that nothing is guaranteed. It is important to note that the decision to proceed with this intervention was based on the information collected from the patient. The Data and conclusions were drawn from the patient's questionnaire, the interview, and the physical examination. Because the information was provided in large part by the patient, it cannot be guaranteed that it has not been purposely or unconsciously manipulated. Every effort has been made to obtain as much relevant data as possible for this evaluation. It is important to note that the conclusions  that lead to this procedure are derived in large part from the available data. Always take into account that the treatment will also be dependent on availability of resources and existing treatment guidelines, considered by other Pain Management Practitioners as being common knowledge and practice, at the time of the intervention. For Medico-Legal purposes, it is also important to point out that variation in procedural techniques and pharmacological choices are the acceptable norm. The indications, contraindications, technique, and results of the above procedure should only be interpreted and judged by a Board-Certified Interventional Pain Specialist with extensive familiarity and expertise in the same exact procedure and technique.

## 2025-01-13 NOTE — Patient Instructions (Signed)
 ______________________________________________________________________    Post-Procedure Discharge Instructions  INSTRUCTIONS Apply ice:  Purpose: This will minimize any swelling and discomfort after procedure.  When: Day of procedure, as soon as you get home. How: Fill a plastic sandwich bag with crushed ice. Cover it with a small towel and apply to injection site. How long: (15 min on, 15 min off) Apply for 15 minutes then remove x 15 minutes.  Repeat sequence on day of procedure, until you go to bed. Apply heat:  Purpose: To treat any soreness and discomfort from the procedure. When: Starting the next day after the procedure. How: Apply heat to procedure site starting the day following the procedure. How long: May continue to repeat daily, until discomfort goes away. Food intake: Start with clear liquids (like water) and advance to regular food, as tolerated.  Physical activities: Keep activities to a minimum for the first 8 hours after the procedure. After that, then as tolerated. Driving: If you have received any sedation, be responsible and do not drive. You are not allowed to drive for 24 hours after having sedation. Blood thinner: (Applies only to those taking blood thinners) You may restart your blood thinner 6 hours after your procedure. Insulin: (Applies only to Diabetic patients taking insulin) As soon as you can eat, you may resume your normal dosing schedule. Infection prevention: Keep procedure site clean and dry. Shower daily and clean area with soap and water.  PAIN DIARY Post-procedure Pain Diary: Extremely important that this be done correctly and accurately. Recorded information will be used to determine the next step in treatment. For the purpose of accuracy, follow these rules: Evaluate only the area treated. Do not report or include pain from an untreated area. For the purpose of this evaluation, ignore all other areas of pain, except for the treated area. After your  procedure, avoid taking a long nap and attempting to complete the pain diary after you wake up. Instead, set your alarm clock to go off every hour, on the hour, for the initial 8 hours after the procedure. Document the duration of the numbing medicine, and the relief you are getting from it. Do not go to sleep and attempt to complete it later. It will not be accurate. If you received sedation, it is likely that you were given a medication that may cause amnesia. Because of this, completing the diary at a later time may cause the information to be inaccurate. This information is needed to plan your care. Follow-up appointment: Keep your post-procedure follow-up evaluation appointment after the procedure (usually 2 weeks for most procedures, 6 weeks for radiofrequencies). DO NOT FORGET to bring you pain diary with you.   EXPECT... (What should I expect to see with my procedure?) From numbing medicine (AKA: Local Anesthetics): Numbness or decrease in pain. You may also experience some weakness, which if present, could last for the duration of the local anesthetic. Onset: Full effect within 15 minutes of injected. Duration: It will depend on the type of local anesthetic used. On the average, 1 to 8 hours.  From steroids (Applies only if steroids were used): Decrease in swelling or inflammation. Once inflammation is improved, relief of the pain will follow. Onset of benefits: Depends on the amount of swelling present. The more swelling, the longer it will take for the benefits to be seen. In some cases, up to 10 days. Duration: Steroids will stay in the system x 2 weeks. Duration of benefits will depend on multiple posibilities including persistent irritating  factors. Side-effects: If present, they may typically last 2 weeks (the duration of the steroids). Frequent: Cramps (if they occur, drink Gatorade and take over-the-counter Magnesium 450-500 mg once to twice a day); water retention with temporary weight  gain; increases in blood sugar; decreased immune system response; increased appetite. Occasional: Facial flushing (red, warm cheeks); mood swings; menstrual changes. Uncommon: Long-term decrease or suppression of natural hormones; bone thinning. (These are more common with higher doses or more frequent use. This is why we prefer that our patients avoid having any injection therapies in other practices.)  Very Rare: Severe mood changes; psychosis; aseptic necrosis. From procedure: Some discomfort is to be expected once the numbing medicine wears off. This should be minimal if ice and heat are applied as instructed.  CALL IF... (When should I call?) You experience numbness and weakness that gets worse with time, as opposed to wearing off. New onset bowel or bladder incontinence. (Applies only to procedures done in the spine)  Emergency Numbers: Durning business hours (Monday - Thursday, 8:00 AM - 4:00 PM) (Friday, 9:00 AM - 12:00 Noon): [phone] After hours: [phone] NOTE: If you are having a problem and are unable connect with, or to talk to a provider, then go to your nearest urgent care or emergency department. If the problem is serious and urgent, please call 911. ______________________________________________________________________     ______________________________________________________________________    Steroid injections  Common steroids for injections Triamcinolone : Used by many sports medicine physicians for large joint and bursal injections, often combined with a local anesthetic like lidocaine . A study focusing on coccydynia (tailbone pain) found triamcinolone  was more effective than betamethasone, suggesting it may also be preferable for other localized inflammation conditions. Methylprednisolone: A common alternative to triamcinolone  that is also a strong anti-inflammatory. It is available in different formulations, with the acetate suspension being the long-acting  option for intra-articular injections. Dexamethasone : This is a non-particulate steroid, meaning it has a lower risk of tissue damage compared to particulate steroids like triamcinolone  and methylprednisolone. While less common for this specific use, it is an option for targeted injections.   Considerations for physicians Particulate vs. non-particulate steroids: Triamcinolone  and methylprednisolone are particulate, meaning they can clump together. Dexamethasone  is non-particulate. Particulate steroids are often preferred for their longer-lasting effects but carry a theoretical higher risk for certain injections (though this is less of a concern in the costochondral joints). Combined injectate: Corticosteroids are typically mixed with a local anesthetic like lidocaine  to provide both immediate pain relief (from the anesthetic) and longer-term inflammation reduction (from the steroid). Imaging guidance: To ensure accurate placement of the needle and medication, physicians may use ultrasound or fluoroscopic guidance for the injection, especially in complex or refractory cases.   Patient guidance Before undergoing a steroid injection, discuss the options with your physician. They will determine the best steroid, dosage, and procedure for your specific case based on factors like: Severity of your condition History of response to other treatments Your overall health status Experience and preference of the physician  Last  Updated: 08/10/2024 ______________________________________________________________________

## 2025-01-31 ENCOUNTER — Ambulatory Visit: Admitting: Pain Medicine
